# Patient Record
Sex: Female | Born: 1980 | Race: White | Hispanic: No | Marital: Single | Smoking: Never smoker
Health system: Southern US, Community
[De-identification: ages and names within clinical notes are randomized; demographics above are authoritative.]

## PROBLEM LIST (undated history)

## (undated) DIAGNOSIS — R42 Dizziness and giddiness: Secondary | ICD-10-CM

## (undated) DIAGNOSIS — F419 Anxiety disorder, unspecified: Secondary | ICD-10-CM

## (undated) DIAGNOSIS — F319 Bipolar disorder, unspecified: Secondary | ICD-10-CM

## (undated) DIAGNOSIS — E119 Type 2 diabetes mellitus without complications: Secondary | ICD-10-CM

## (undated) DIAGNOSIS — T7840XA Allergy, unspecified, initial encounter: Secondary | ICD-10-CM

## (undated) DIAGNOSIS — F32A Depression, unspecified: Secondary | ICD-10-CM

## (undated) HISTORY — DX: Anxiety disorder, unspecified: F41.9

## (undated) HISTORY — DX: Allergy, unspecified, initial encounter: T78.40XA

## (undated) HISTORY — DX: Depression, unspecified: F32.A

## (undated) HISTORY — PX: CHOLECYSTECTOMY: SHX55

---

## 2005-02-10 ENCOUNTER — Emergency Department: Payer: Self-pay | Admitting: Emergency Medicine

## 2006-07-29 ENCOUNTER — Other Ambulatory Visit: Payer: Self-pay

## 2006-07-29 ENCOUNTER — Emergency Department: Payer: Self-pay | Admitting: Unknown Physician Specialty

## 2007-11-04 ENCOUNTER — Emergency Department: Payer: Self-pay | Admitting: Emergency Medicine

## 2007-12-16 ENCOUNTER — Ambulatory Visit: Payer: Self-pay | Admitting: Gastroenterology

## 2008-07-15 ENCOUNTER — Emergency Department: Payer: Self-pay | Admitting: Emergency Medicine

## 2008-08-06 ENCOUNTER — Emergency Department: Payer: Self-pay | Admitting: Emergency Medicine

## 2008-11-16 ENCOUNTER — Emergency Department: Payer: Self-pay | Admitting: Emergency Medicine

## 2008-11-19 ENCOUNTER — Emergency Department: Payer: Self-pay | Admitting: Emergency Medicine

## 2008-11-28 ENCOUNTER — Ambulatory Visit: Payer: Self-pay | Admitting: Unknown Physician Specialty

## 2008-12-31 ENCOUNTER — Emergency Department: Payer: Self-pay | Admitting: Unknown Physician Specialty

## 2009-02-28 ENCOUNTER — Emergency Department: Payer: Self-pay | Admitting: Emergency Medicine

## 2010-02-13 ENCOUNTER — Inpatient Hospital Stay: Payer: Self-pay | Admitting: Internal Medicine

## 2010-02-23 ENCOUNTER — Emergency Department (HOSPITAL_COMMUNITY)
Admission: EM | Admit: 2010-02-23 | Discharge: 2010-02-24 | Payer: Self-pay | Source: Home / Self Care | Admitting: Emergency Medicine

## 2010-02-26 LAB — URINALYSIS, ROUTINE W REFLEX MICROSCOPIC
Nitrite: NEGATIVE
Specific Gravity, Urine: 1.018 (ref 1.005–1.030)
Urobilinogen, UA: 0.2 mg/dL (ref 0.0–1.0)
pH: 6 (ref 5.0–8.0)

## 2010-02-26 LAB — COMPREHENSIVE METABOLIC PANEL
Albumin: 3.6 g/dL (ref 3.5–5.2)
BUN: 12 mg/dL (ref 6–23)
Calcium: 9.7 mg/dL (ref 8.4–10.5)
Creatinine, Ser: 0.82 mg/dL (ref 0.4–1.2)
Total Protein: 7.7 g/dL (ref 6.0–8.3)

## 2010-02-26 LAB — DIFFERENTIAL
Eosinophils Absolute: 0.4 10*3/uL (ref 0.0–0.7)
Eosinophils Relative: 3 % (ref 0–5)
Lymphs Abs: 3.7 10*3/uL (ref 0.7–4.0)
Monocytes Absolute: 1.5 10*3/uL — ABNORMAL HIGH (ref 0.1–1.0)
Monocytes Relative: 10 % (ref 3–12)

## 2010-02-26 LAB — CBC
MCH: 32.7 pg (ref 26.0–34.0)
MCHC: 34.8 g/dL (ref 30.0–36.0)
MCV: 94 fL (ref 78.0–100.0)
Platelets: 253 10*3/uL (ref 150–400)
RDW: 12.4 % (ref 11.5–15.5)
WBC: 14.7 10*3/uL — ABNORMAL HIGH (ref 4.0–10.5)

## 2010-02-26 LAB — POCT PREGNANCY, URINE: Preg Test, Ur: NEGATIVE

## 2010-02-26 LAB — LIPASE, BLOOD: Lipase: 675 U/L — ABNORMAL HIGH (ref 11–59)

## 2010-04-03 ENCOUNTER — Ambulatory Visit (HOSPITAL_COMMUNITY)
Admission: RE | Admit: 2010-04-03 | Discharge: 2010-04-03 | Disposition: A | Discharge status: Home / Self Care | Payer: Self-pay | Source: Ambulatory Visit | Attending: Surgery | Admitting: Surgery

## 2010-04-03 ENCOUNTER — Encounter (HOSPITAL_COMMUNITY)
Admission: RE | Admit: 2010-04-03 | Discharge: 2010-04-03 | Disposition: A | Discharge status: Home / Self Care | Payer: Self-pay | Source: Ambulatory Visit | Attending: Surgery | Admitting: Surgery

## 2010-04-03 ENCOUNTER — Other Ambulatory Visit (HOSPITAL_COMMUNITY): Payer: Self-pay | Admitting: Surgery

## 2010-04-03 DIAGNOSIS — K8020 Calculus of gallbladder without cholecystitis without obstruction: Secondary | ICD-10-CM | POA: Insufficient documentation

## 2010-04-03 DIAGNOSIS — Z01818 Encounter for other preprocedural examination: Secondary | ICD-10-CM | POA: Insufficient documentation

## 2010-04-03 DIAGNOSIS — Z01812 Encounter for preprocedural laboratory examination: Secondary | ICD-10-CM | POA: Insufficient documentation

## 2010-04-03 DIAGNOSIS — R0602 Shortness of breath: Secondary | ICD-10-CM | POA: Insufficient documentation

## 2010-04-03 LAB — COMPREHENSIVE METABOLIC PANEL
ALT: 31 U/L (ref 0–35)
AST: 22 U/L (ref 0–37)
Albumin: 4 g/dL (ref 3.5–5.2)
CO2: 29 mEq/L (ref 19–32)
Calcium: 9.8 mg/dL (ref 8.4–10.5)
Creatinine, Ser: 0.87 mg/dL (ref 0.4–1.2)
GFR calc Af Amer: >60 mL/min (ref >60)
GFR calc non Af Amer: >60 mL/min (ref >60)
Sodium: 140 mEq/L (ref 135–145)
Total Protein: 7.4 g/dL (ref 6.0–8.3)

## 2010-04-03 LAB — DIFFERENTIAL
Basophils Absolute: 0.1 10*3/uL (ref 0.0–0.1)
Basophils Relative: 0 % (ref 0–1)
Eosinophils Relative: 3 % (ref 0–5)
Lymphocytes Relative: 27 % (ref 12–46)
Neutro Abs: 8.1 10*3/uL — ABNORMAL HIGH (ref 1.7–7.7)

## 2010-04-03 LAB — HCG, SERUM, QUALITATIVE: Preg, Serum: NEGATIVE

## 2010-04-03 LAB — SURGICAL PCR SCREEN
MRSA, PCR: NEGATIVE
Staphylococcus aureus: POSITIVE — AB

## 2010-04-03 LAB — CBC
HCT: 43.6 % (ref 36.0–46.0)
MCHC: 33.7 g/dL (ref 30.0–36.0)
RDW: 13.1 % (ref 11.5–15.5)
WBC: 13.7 10*3/uL — ABNORMAL HIGH (ref 4.0–10.5)

## 2010-04-05 ENCOUNTER — Ambulatory Visit (HOSPITAL_COMMUNITY): Payer: Self-pay

## 2010-04-05 ENCOUNTER — Ambulatory Visit (HOSPITAL_COMMUNITY)
Admission: RE | Admit: 2010-04-05 | Discharge: 2010-04-05 | Disposition: A | Discharge status: Home / Self Care | Payer: Self-pay | Source: Ambulatory Visit | Attending: Surgery | Admitting: Surgery

## 2010-04-05 ENCOUNTER — Other Ambulatory Visit: Payer: Self-pay | Admitting: Surgery

## 2010-04-05 DIAGNOSIS — K8010 Calculus of gallbladder with chronic cholecystitis without obstruction: Secondary | ICD-10-CM | POA: Insufficient documentation

## 2010-04-05 DIAGNOSIS — F319 Bipolar disorder, unspecified: Secondary | ICD-10-CM | POA: Insufficient documentation

## 2010-04-05 DIAGNOSIS — K219 Gastro-esophageal reflux disease without esophagitis: Secondary | ICD-10-CM | POA: Insufficient documentation

## 2010-04-05 DIAGNOSIS — E669 Obesity, unspecified: Secondary | ICD-10-CM | POA: Insufficient documentation

## 2010-04-08 NOTE — Op Note (Signed)
NAME:  Pamela Rasmussen, Pamela Rasmussen             ACCOUNT NO.:  [id number]  MEDICAL RECORD NO.:  [id number]           PATIENT TYPE:  O  LOCATION:  [facility]                         FACILITY:  [facility]  PHYSICIAN:  Wilmon Arms. Corliss Skains, M.D. DATE OF BIRTH:  [birth year]  DATE OF PROCEDURE:  04/05/2010 DATE OF DISCHARGE:  04/05/2010                              OPERATIVE REPORT   PREOPERATIVE DIAGNOSES:  Chronic calculus, cholecystitis, history of gallstone pancreatitis.  POSTOPERATIVE DIAGNOSES:  Chronic calculus, cholecystitis, history of gallstone pancreatitis.  PROCEDURE PERFORMED:  Laparoscopic cholecystectomy with intraoperative cholangiogram.  SURGEON:  Wilmon Arms. Corliss Skains, MD  ANESTHESIA:  General.  INDICATIONS:  This is a 30 year old female who presents with several years of gallbladder symptoms.  She has had a diagnosis of gallstones for least 2 years.  Apparently she had 1 episode of gallstone pancreatitis for which she was hospitalized at [facility].  She presents now for cholecystectomy.  Her preoperative liver function tests are within normal limits.  Her lipase is minimally elevated at 82.  She presents now for cholecystectomy.  DESCRIPTION OF PROCEDURE:  The patient was brought to the operating room and placed supine position on operating room table.  After an adequate level of general anesthesia was obtained, the patient's abdomen was prepped with ChloraPrep and draped in sterile fashion.  Time-out was taken to ensure proper patient, proper procedure.  We infiltrated the area below the umbilicus with 0.25%  Marcaine with epinephrine. Transverse incision was made.  Dissection was carried down through a considerable amount of adipose tissue to the fascia.  I grasped the fascia with Kocher clamps and lifted.  We divided the fascia vertically. We entered the peritoneal cavity bluntly.  A stay suture of 0 Vicryl was placed around the fascial opening.  The Hasson cannula was inserted and secured  to stay suture.  Pneumoperitoneum was obtained by insufflating CO2 maintaining maximal pressure of 15 mmHg.  The laparoscope was inserted and the patient was positioned in reverse Trendelenburg tilted to left.  An 11-mm port was placed in the subxiphoid position.  Two 5-mm ports were placed in the right upper quadrant.  The gallbladder was grasped with clamp and elevated over the edge of the liver.  There were some minimal adhesions to the hilum of gallbladder.  We opened the peritoneum around the hilum of the gallbladder and bluntly dissected around the cystic duct and the cystic artery.  The cystic duct was ligated, clipped distally.  A small opening was created on the cystic duct.  A Cook cholangiogram catheter was inserted through a stab incision threaded in the cystic duct.  A cholangiogram was then obtained which showed good flow proximally and distally in biliary tree with no sign of filling defect.  Contrast flowed easily into the duodenum.  The catheter was removed and the cystic duct was ligated with clips and divided.  The cystic artery was ligated with clips and divided.  Cautery was then used to dissect the gallbladder free from the liver.  The gallbladder was fairly intrahepatic in location.  A couple of small pinholes were made in the gallbladder and little bit of bile was spilled.  However, no stones were noted.  The gallbladder was placed in an EndoCatch sac.  We then thoroughly irrigated the gallbladder fossa and inspected for hemostasis.  We cauterized thoroughly for hemostasis. The gallbladder and EndoCatch sac were then removed through the umbilical port site.  The pursestring sutures were used to close down the umbilical fascia.  We again irrigated the right upper quadrant and suctioned as much irrigation as possible.  Pneumoperitoneum was then released as we removed the trocars.  A 4-0 Monocryl was used to close the skin incisions.  Steri- Strips and clean dressings  were applied.  The patient was then extubated and brought to recovery room in stable condition.  All sponge, instrument, and needle counts were correct.     Wilmon Arms. Corliss Skains, M.D.     MKT/MEDQ  D:  04/05/2010  T:  04/05/2010  Job:  [id number]  Electronically Signed by Manus Rudd M.D. on 04/08/2010 01:49:10 PM

## 2011-04-18 ENCOUNTER — Emergency Department: Payer: Self-pay | Admitting: Emergency Medicine

## 2011-07-15 ENCOUNTER — Emergency Department: Payer: Self-pay | Admitting: Emergency Medicine

## 2011-12-09 IMAGING — US US ABDOMEN COMPLETE
1 series · 14 of 25 positions shown · non-contrast
Comparison: None.

CLINICAL DATA: Abdominal pain.  Pancreatitis.

ABDOMINAL ULTRASOUND COMPLETE

[Series 1: us abdomen complete · 0.27mm/px · 14 of 76 slices shown]
[im 1/76]
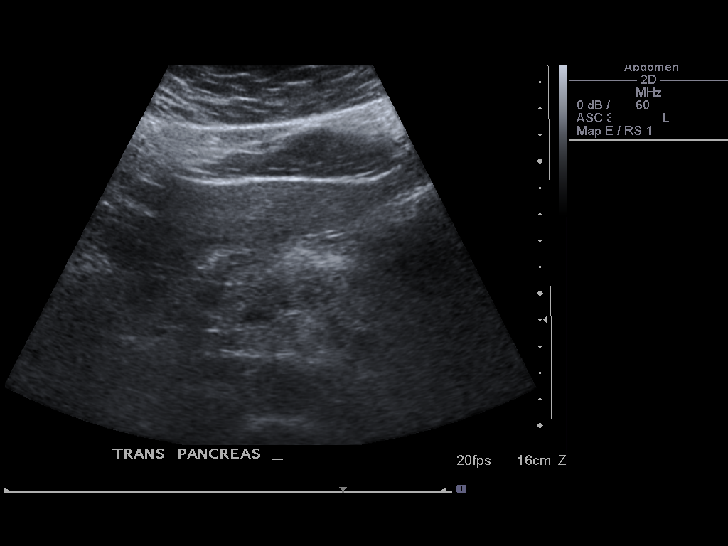
[im 7/76]
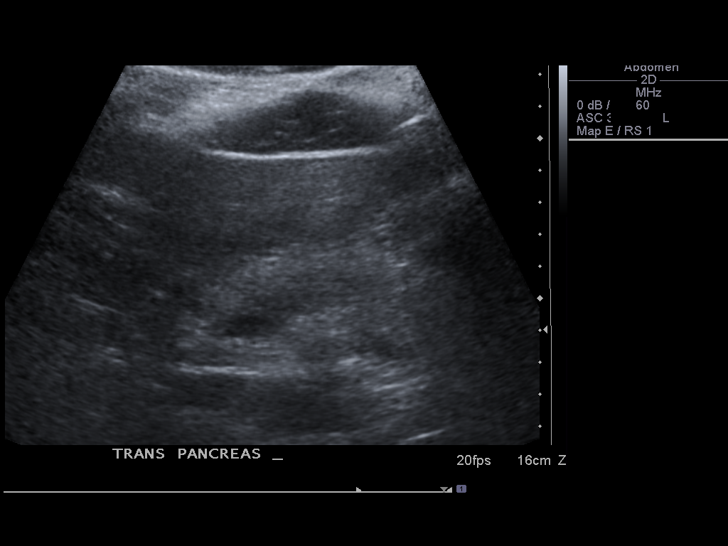
[im 13/76]
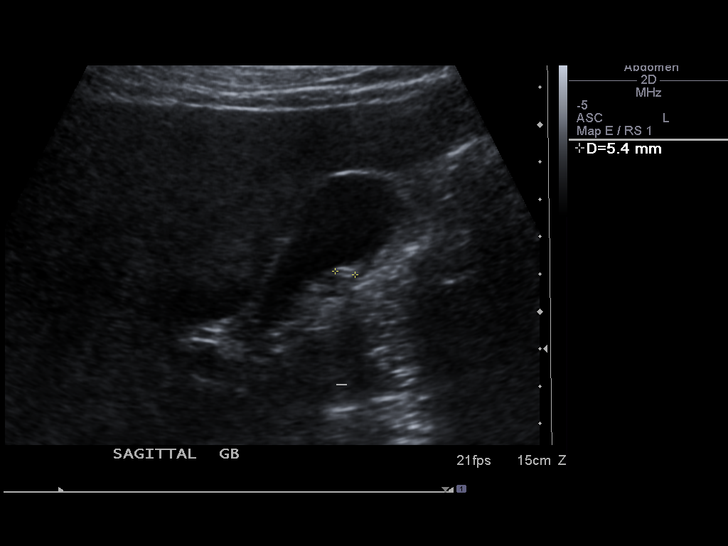
[im 19/76]
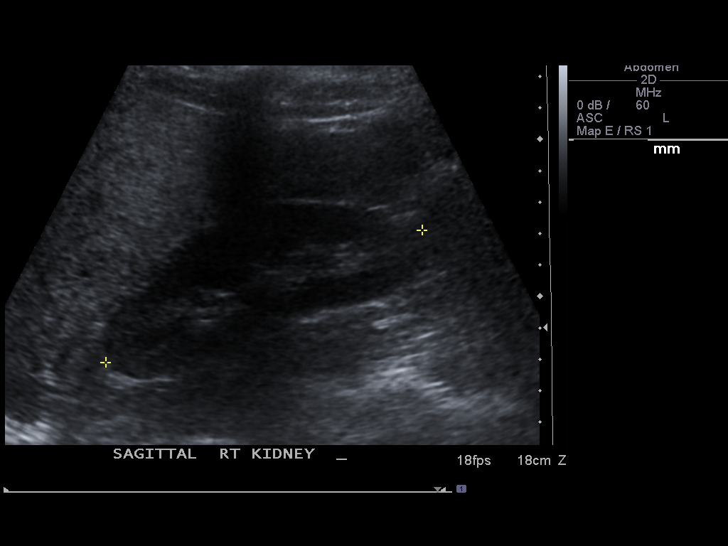
[im 26/76]
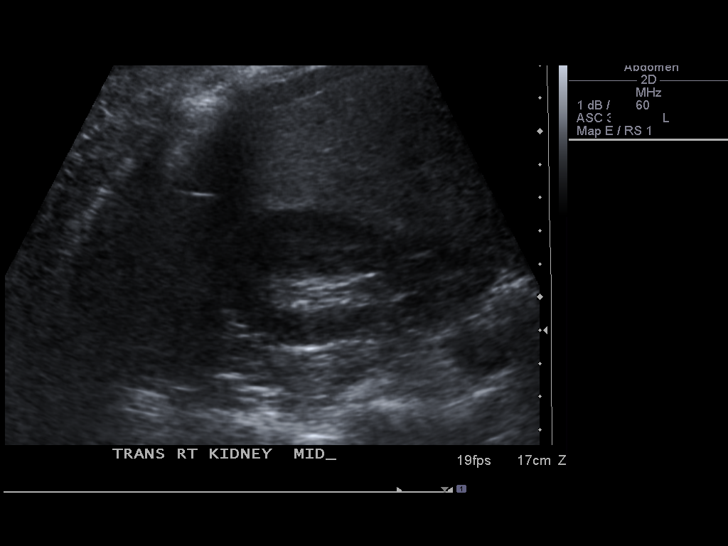
[im 29/76]
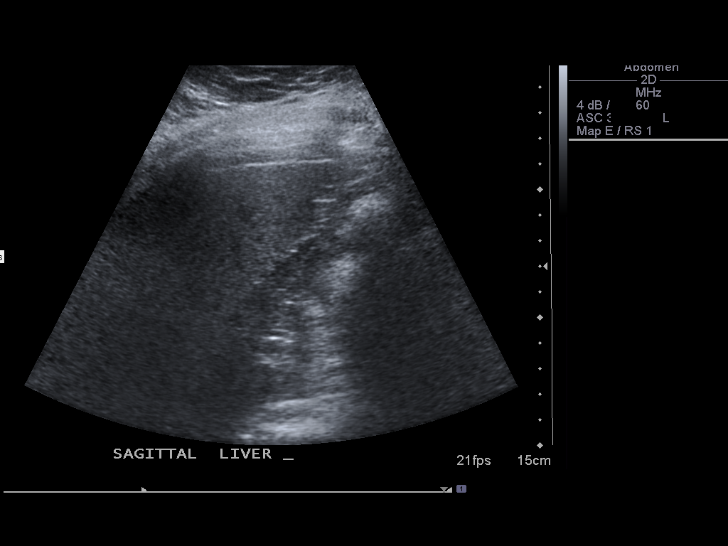
[im 35/76]
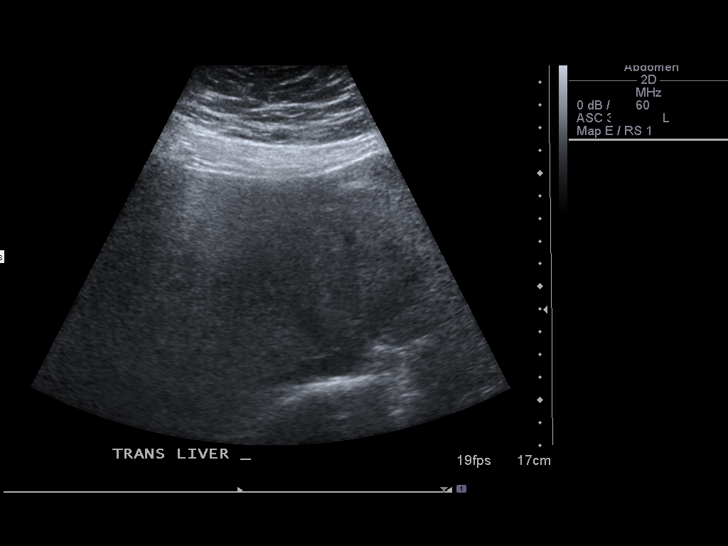
[im 41/76]
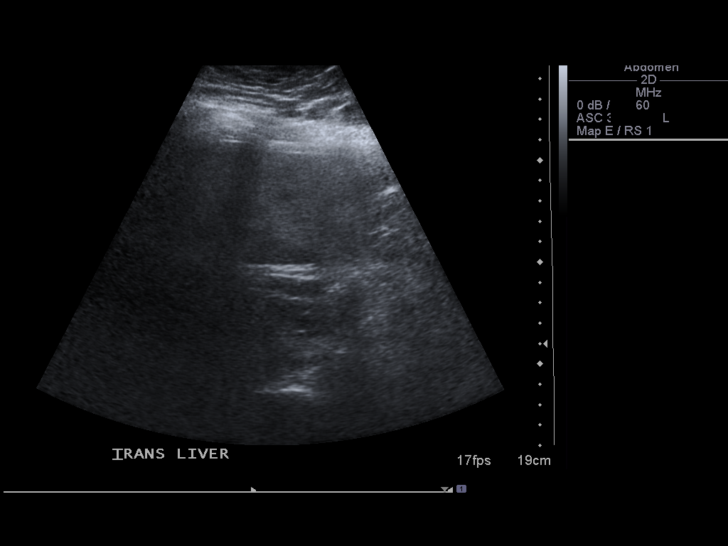
[im 47/76]
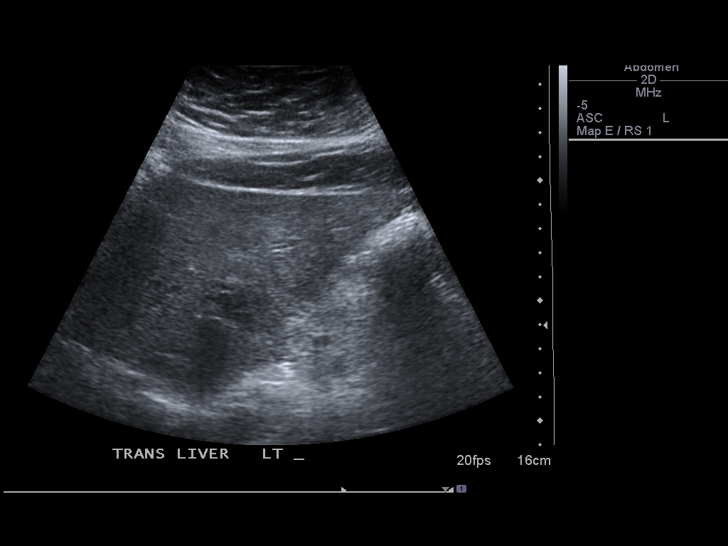
[im 51/76]
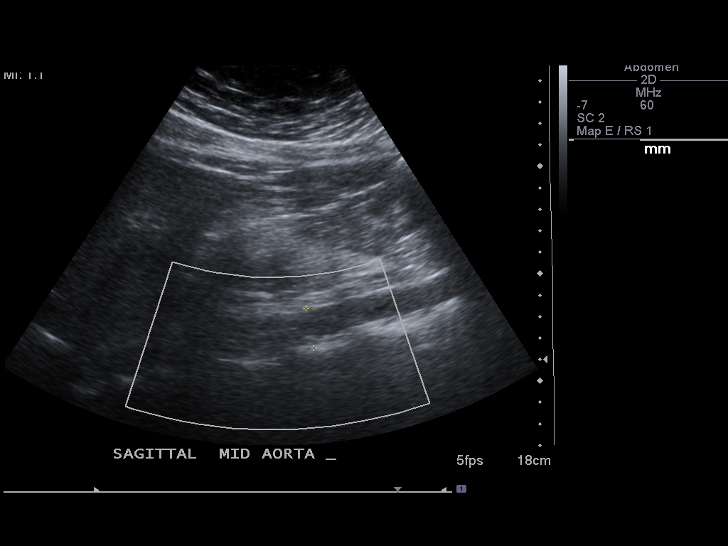
[im 57/76]
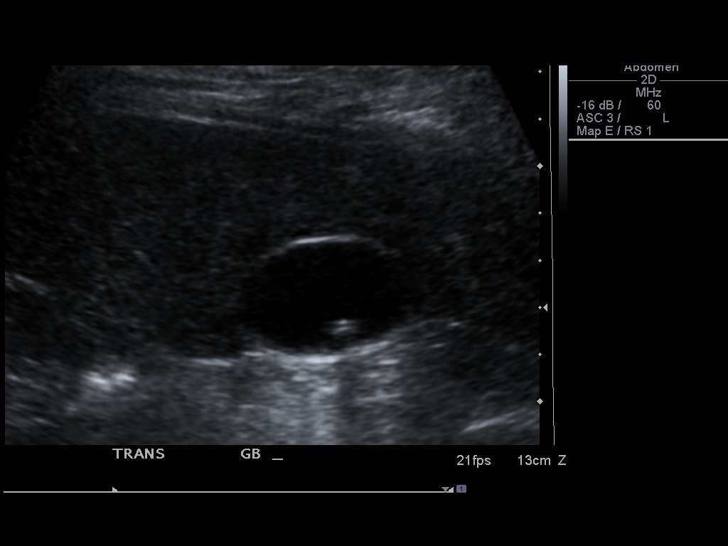
[im 63/76]
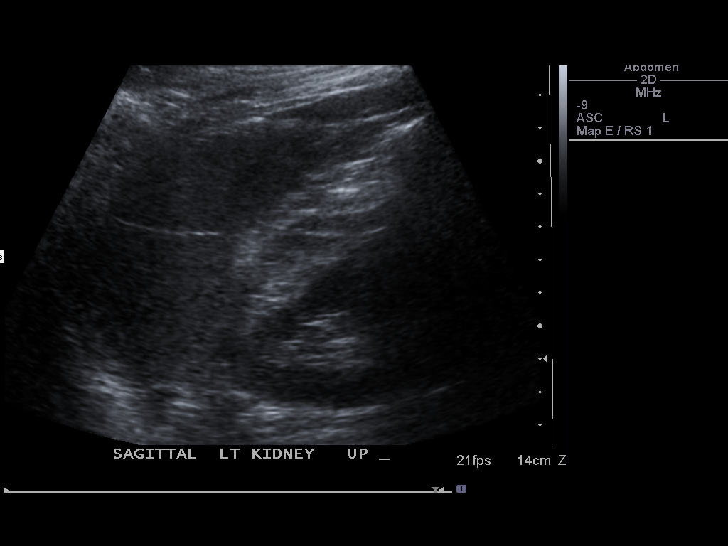
[im 69/76]
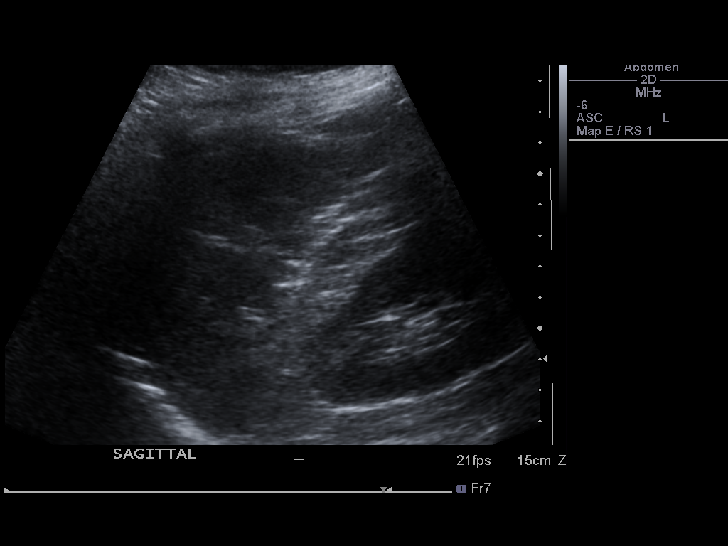
[im 76/76]
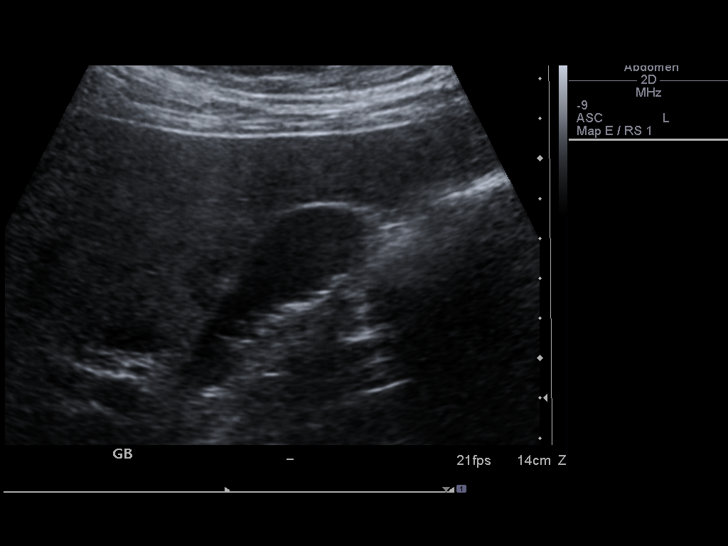

[14 of 25 positions shown; findings below may reference images not displayed]

FINDINGS: Gallbladder:  Multiple small less than 5 mm gallstones are seen.
No evidence of gallbladder dilatation wall thickening.

Common Bile Duct:  Within normal limits in caliber.  Measures 4 mm
in diameter.

Liver: Diffusely increased parenchymal echogenicity, consistent
with hepatic steatosis.  No focal liver mass identified.

IVC:  Appears normal.

Pancreas:  No abnormality identified.

Spleen:  Within normal limits in size and echotexture.

Right kidney:  Normal in size and parenchymal echogenicity.  No
evidence of mass or hydronephrosis.

Left kidney:  Normal in size and parenchymal echogenicity.  No
evidence of mass or hydronephrosis.

Abdominal Aorta:  No aneurysm identified.
IMPRESSION: 1.  Cholelithiasis.  No evidence of acute cholecystitis or biliary
dilatation.
2.  Hepatic steatosis.

## 2012-01-04 ENCOUNTER — Emergency Department: Payer: Self-pay | Admitting: Emergency Medicine

## 2012-01-16 IMAGING — CR DG CHEST 2V
2 series · 2 of 2 positions shown · non-contrast
Comparison: None

CLINICAL DATA: Preop for gallbladder disease.

CHEST - 2 VIEW

[view not recorded (1 of 2)]
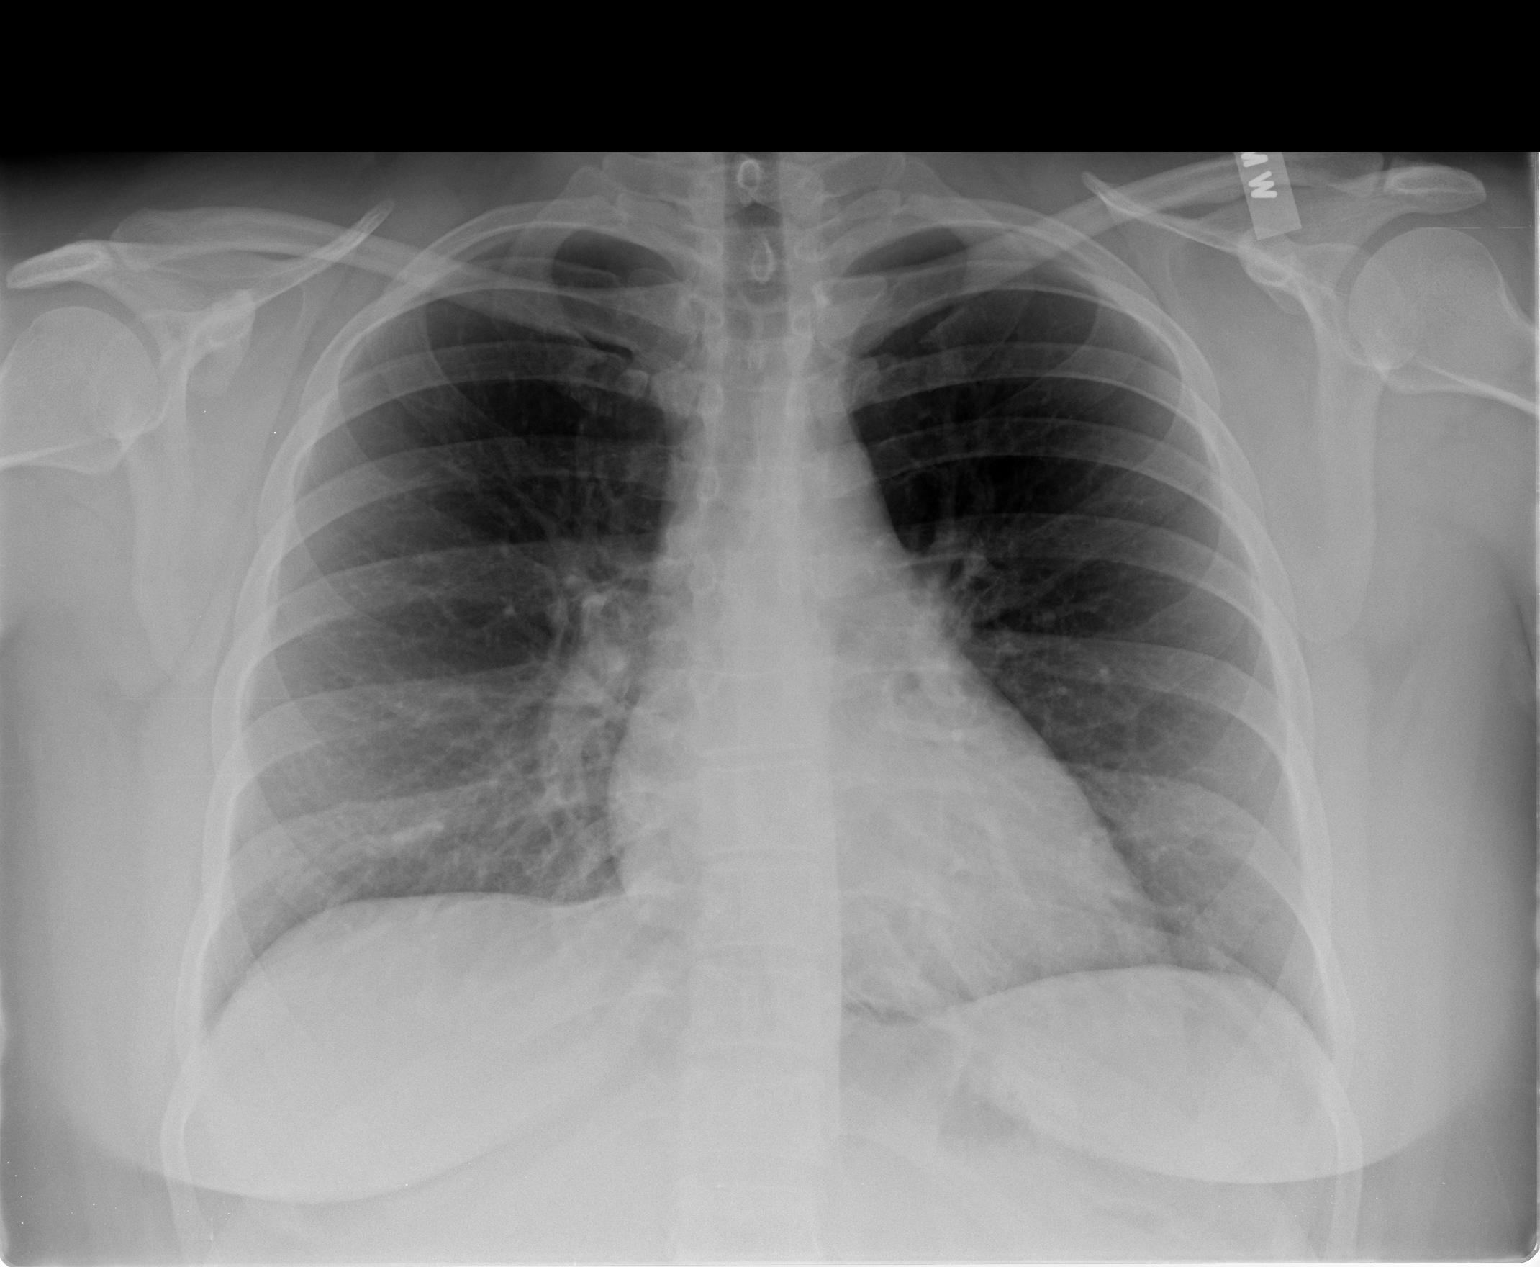

[view not recorded (2 of 2)]
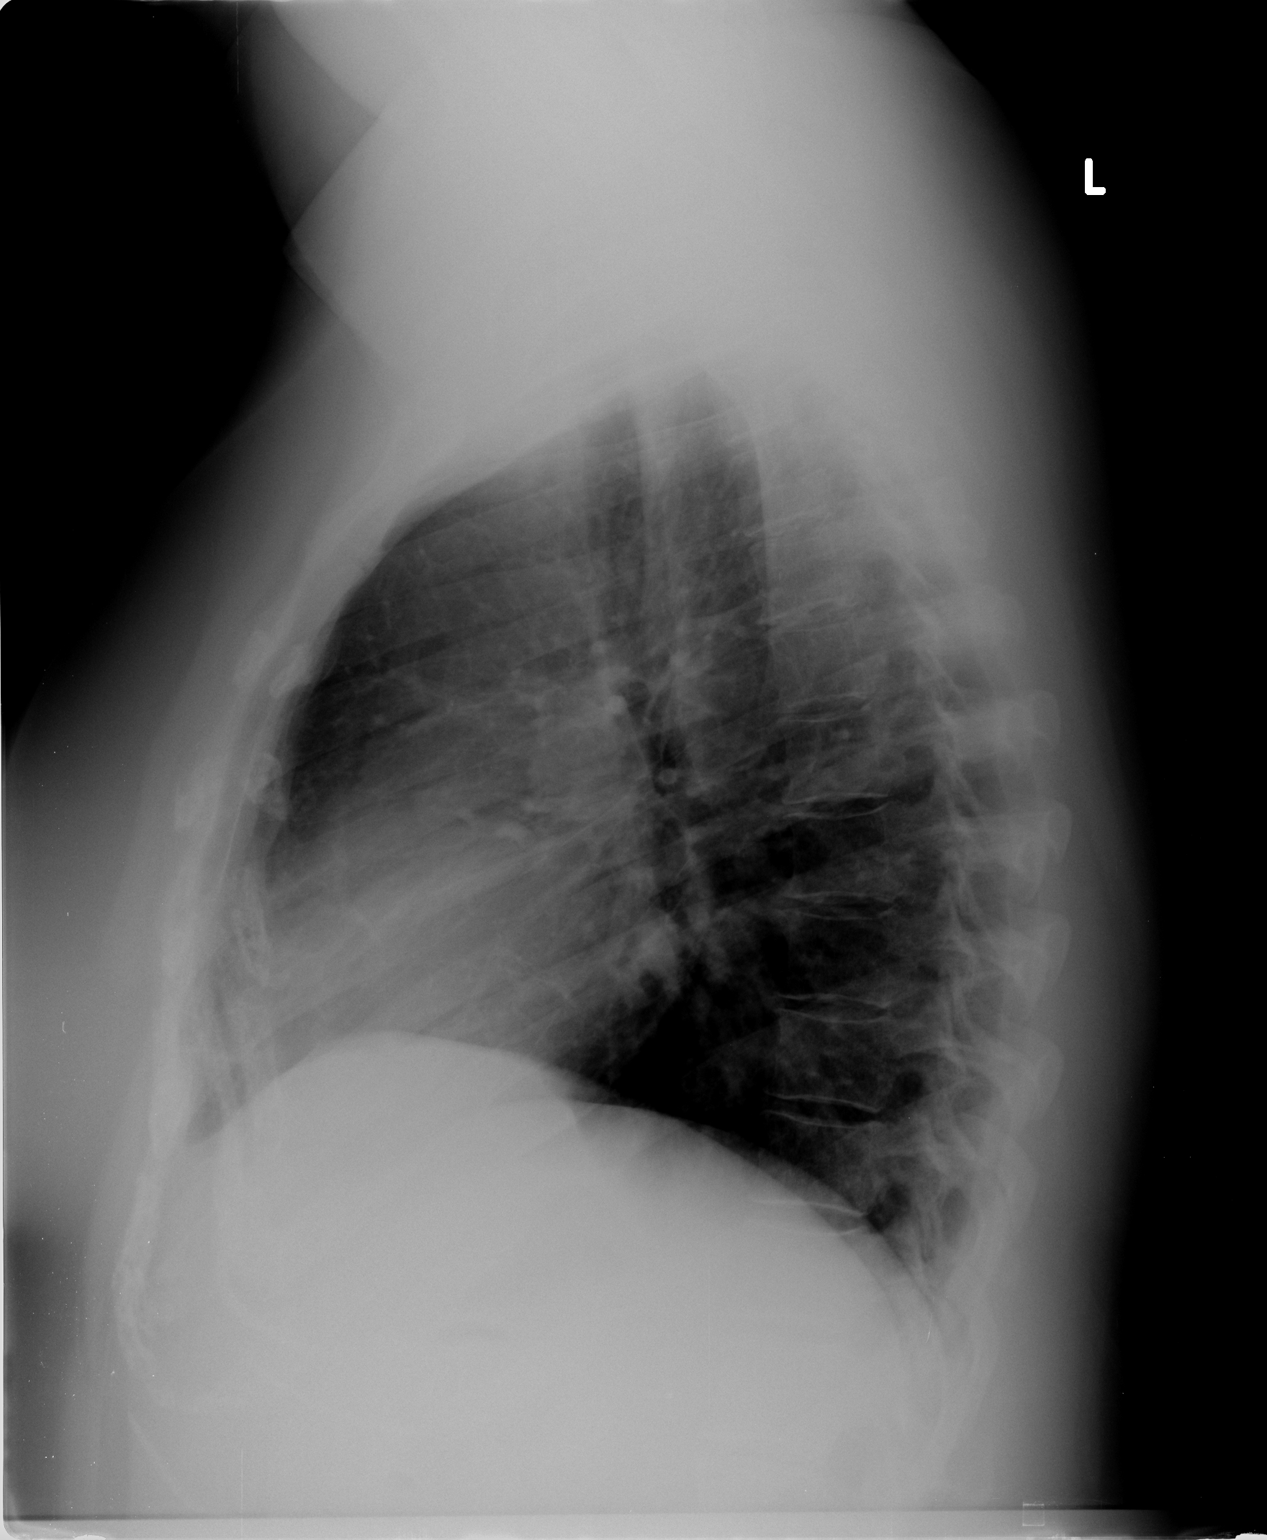

[2 of 2 positions shown; findings below may reference images not displayed]

FINDINGS: The cardiac silhouette, mediastinal and hilar contours
are within normal limits.  The lungs are clear.  The bony thorax is
intact
IMPRESSION: No acute cardiopulmonary findings.

## 2012-01-18 IMAGING — RF DG CHOLANGIOGRAM OPERATIVE
1 series · 5 of 5 positions shown · non-contrast
Comparison: None.

CLINICAL DATA: Laparoscopic cholecystectomy

INTRAOPERATIVE CHOLANGIOGRAM
TECHNIQUE: Cholangiographic images from the C-arm fluoroscopic
device were submitted for interpretation post-operatively.  Please
see the procedural report for the amount of contrast and the
fluoroscopy time utilized.

[Series 1: run · 2 acquisitions, 5 frames shown]
[im 1/2]
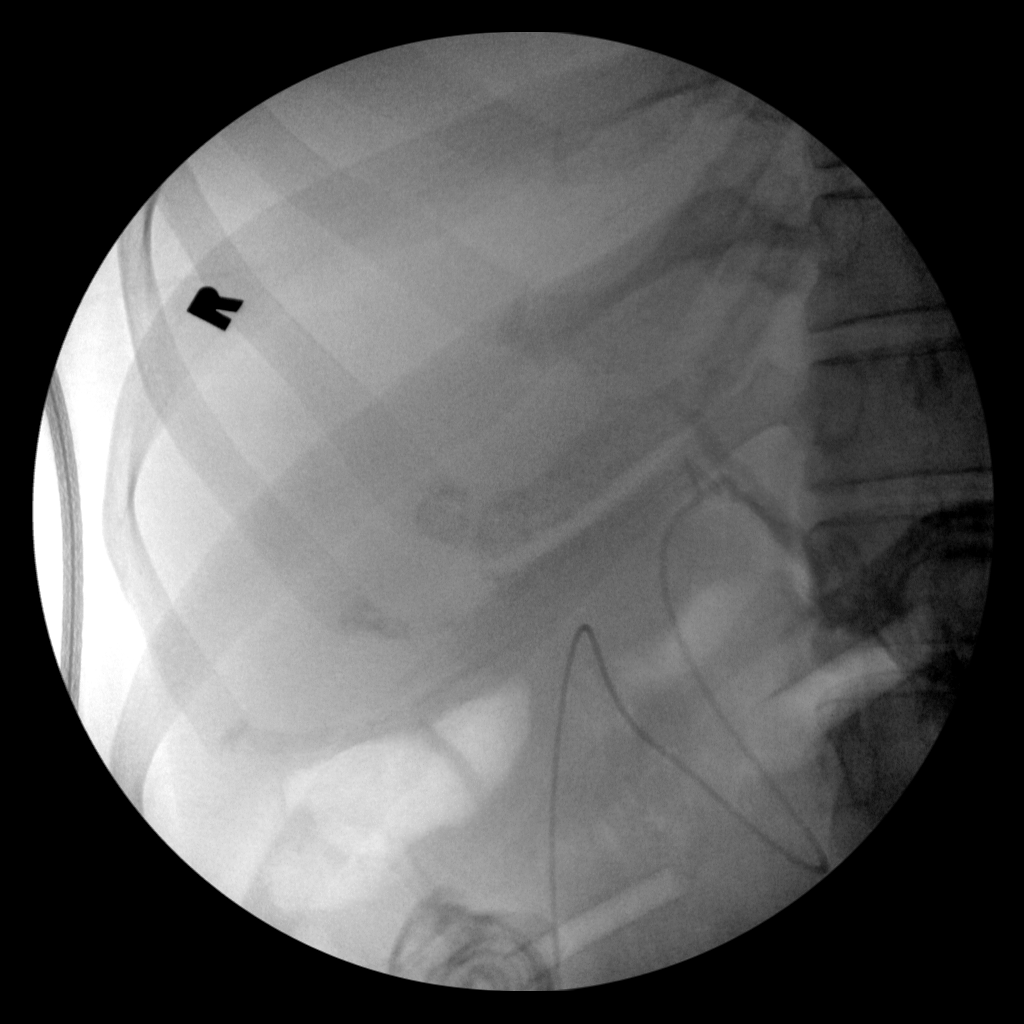
[im 1/2]
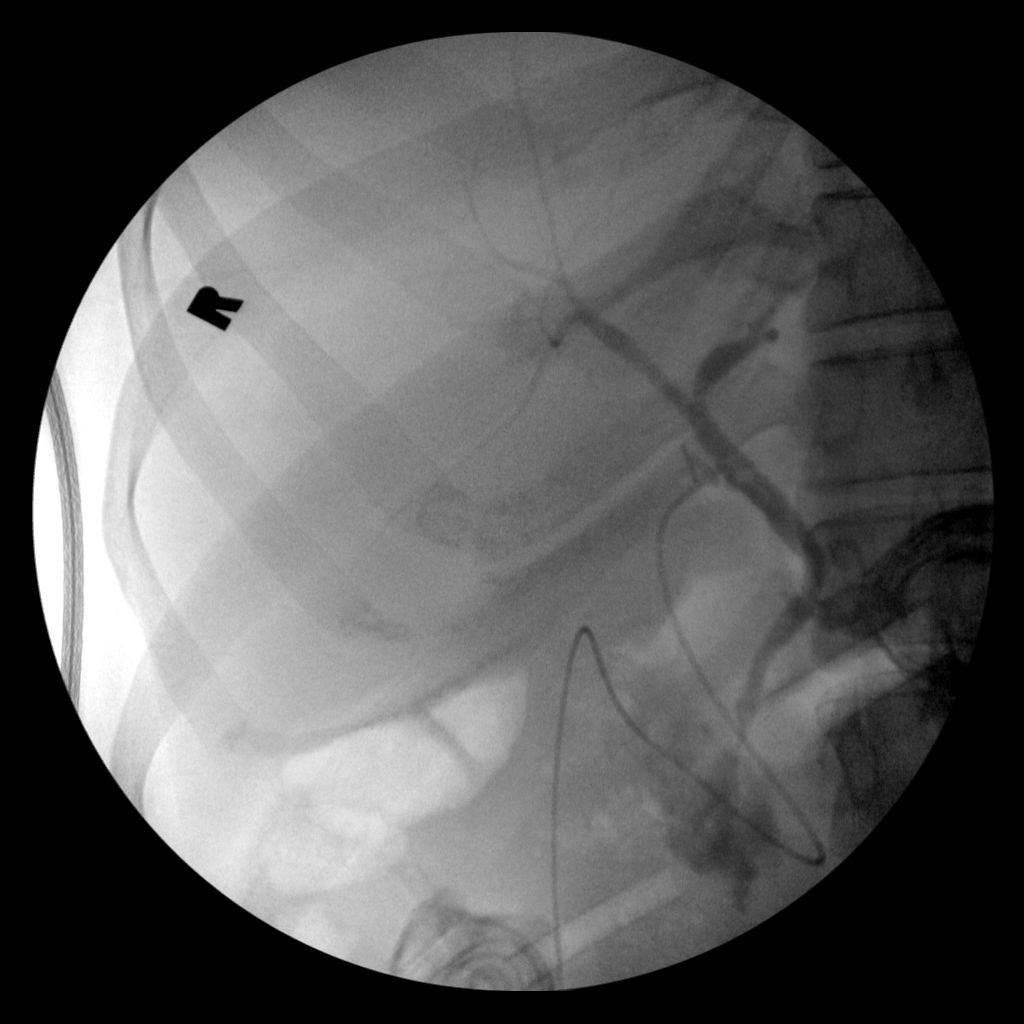
[im 1/2]
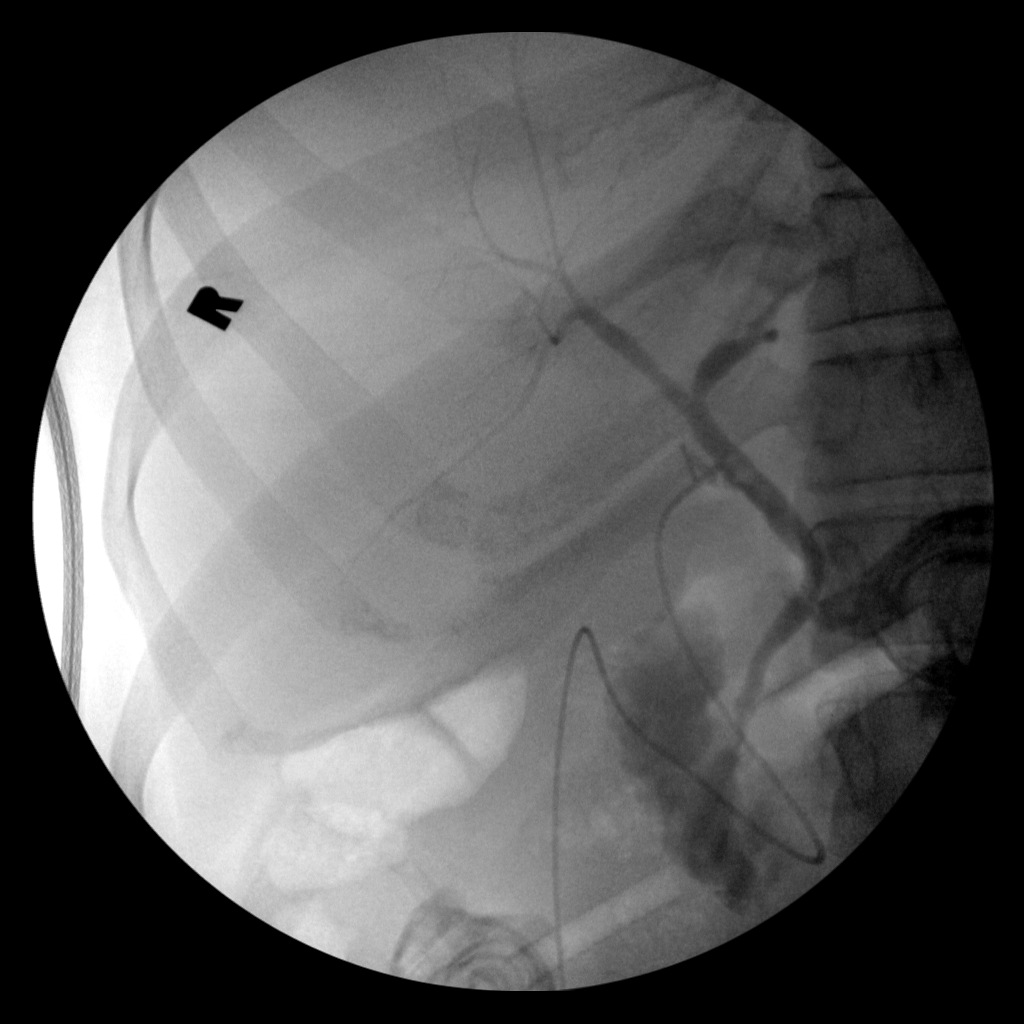
[im 1/2]
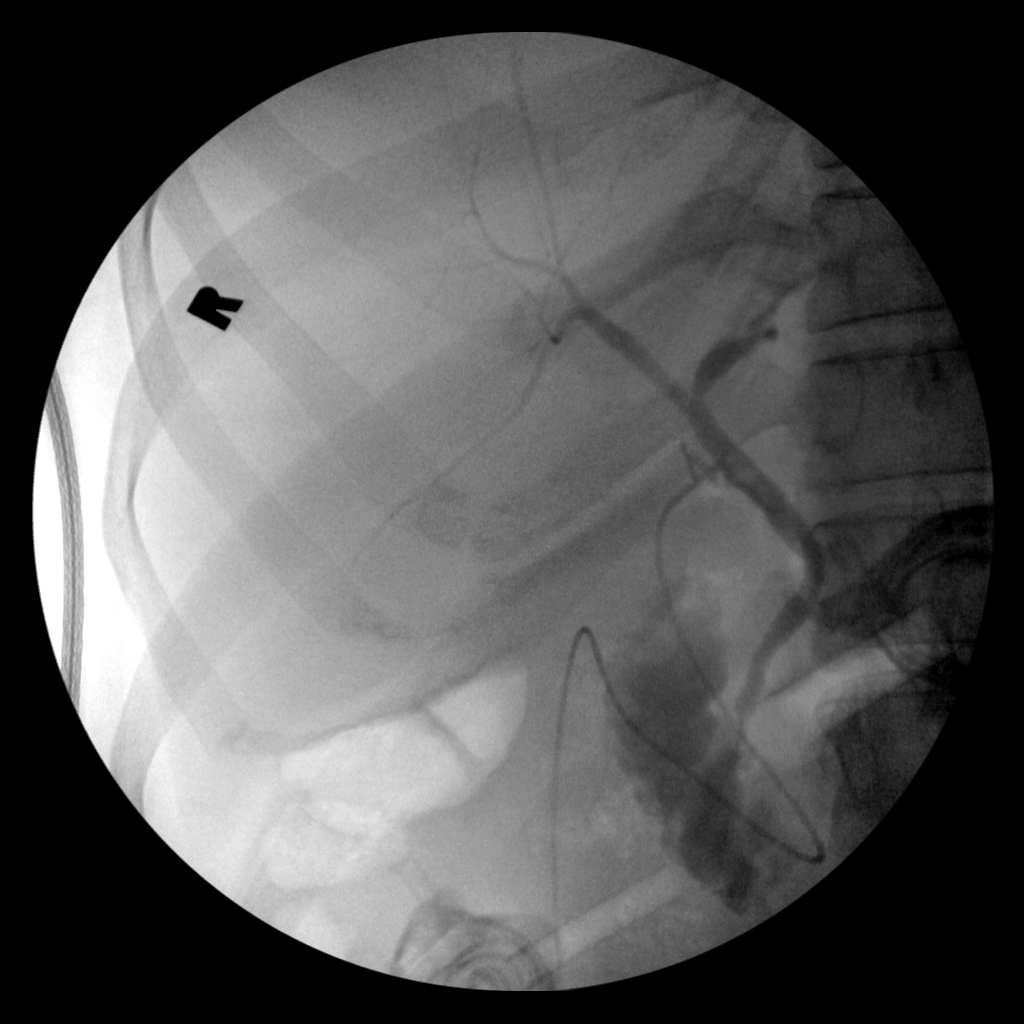
[im 2/2]
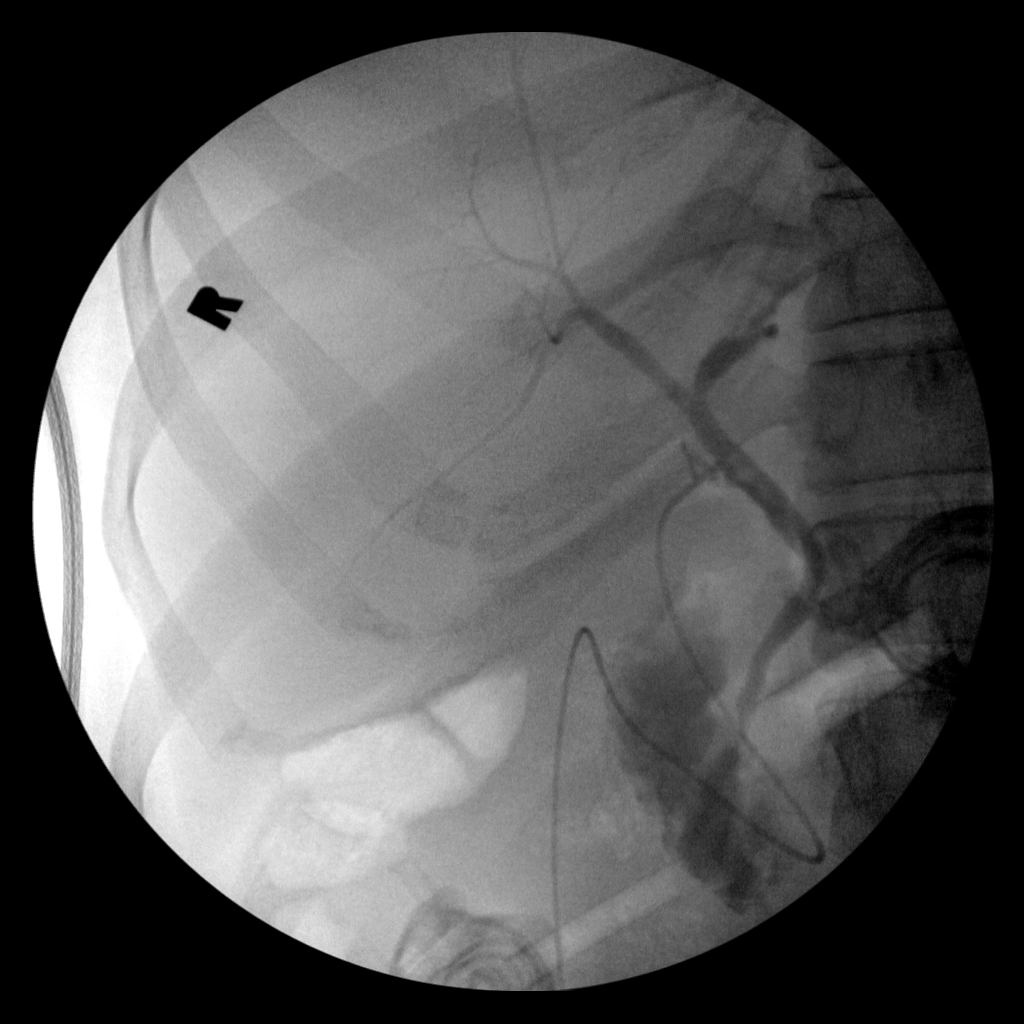

[5 of 5 positions shown; findings below may reference images not displayed]

FINDINGS: The intra and extrahepatic biliary radicles are normal
in caliber.  There are several filling defects in the proximal
common bile duct.  They seem to change shape during the injection,
suggesting that they are air bubbles.  However, several small to
moderate-sized common bile duct stones cannot be excluded based on
this exam.

No evidence for obstruction.
IMPRESSION: Probable air bubbles in the common bile duct.  Cannot rule out
several stones.  See report.

## 2013-06-02 ENCOUNTER — Emergency Department: Payer: Self-pay | Admitting: Emergency Medicine

## 2013-06-02 LAB — COMPREHENSIVE METABOLIC PANEL
ALK PHOS: 99 U/L
ANION GAP: 13 (ref 7–16)
Albumin: 3.8 g/dL (ref 3.4–5.0)
BUN: 15 mg/dL (ref 7–18)
Bilirubin,Total: 0.7 mg/dL (ref 0.2–1.0)
Calcium, Total: 9.6 mg/dL (ref 8.5–10.1)
Chloride: 104 mmol/L (ref 98–107)
Co2: 21 mmol/L (ref 21–32)
Creatinine: 0.92 mg/dL (ref 0.60–1.30)
Glucose: 144 mg/dL — ABNORMAL HIGH (ref 65–99)
Osmolality: 279 (ref 275–301)
Potassium: 3.7 mmol/L (ref 3.5–5.1)
SGOT(AST): 17 U/L (ref 15–37)
SGPT (ALT): 28 U/L (ref 12–78)
Sodium: 138 mmol/L (ref 136–145)
TOTAL PROTEIN: 7.9 g/dL (ref 6.4–8.2)

## 2013-06-02 LAB — LIPASE, BLOOD: LIPASE: 85 U/L (ref 73–393)

## 2013-06-03 LAB — CBC WITH DIFFERENTIAL/PLATELET
BASOS PCT: 0.1 %
Basophil #: 0 10*3/uL (ref 0.0–0.1)
EOS PCT: 0.2 %
Eosinophil #: 0 10*3/uL (ref 0.0–0.7)
HCT: 47.3 % — AB (ref 35.0–47.0)
HGB: 15.7 g/dL (ref 12.0–16.0)
LYMPHS ABS: 0.4 10*3/uL — AB (ref 1.0–3.6)
LYMPHS PCT: 3.1 %
MCH: 31.9 pg (ref 26.0–34.0)
MCHC: 33.1 g/dL (ref 32.0–36.0)
MCV: 96 fL (ref 80–100)
MONO ABS: 0.1 x10 3/mm — AB (ref 0.2–0.9)
MONOS PCT: 0.7 %
NEUTROS ABS: 12.8 10*3/uL — AB (ref 1.4–6.5)
NEUTROS PCT: 95.9 %
PLATELETS: 213 10*3/uL (ref 150–440)
RBC: 4.91 10*6/uL (ref 3.80–5.20)
RDW: 13.3 % (ref 11.5–14.5)
WBC: 13.3 10*3/uL — AB (ref 3.6–11.0)

## 2013-06-03 LAB — URINALYSIS, COMPLETE
Bacteria: NONE SEEN
Bilirubin,UR: NEGATIVE
Blood: NEGATIVE
Glucose,UR: NEGATIVE mg/dL (ref 0–75)
Ketone: NEGATIVE
LEUKOCYTE ESTERASE: NEGATIVE
Nitrite: NEGATIVE
PROTEIN: NEGATIVE
Ph: 5 (ref 4.5–8.0)
RBC,UR: <1 /HPF (ref 0–5)
Specific Gravity: 1.025 (ref 1.003–1.030)

## 2013-10-30 ENCOUNTER — Emergency Department: Payer: Self-pay | Admitting: Emergency Medicine

## 2014-01-30 ENCOUNTER — Emergency Department: Payer: Self-pay | Admitting: Emergency Medicine

## 2014-01-30 LAB — COMPREHENSIVE METABOLIC PANEL
ALBUMIN: 3.4 g/dL (ref 3.4–5.0)
ALK PHOS: 108 U/L
ALT: 44 U/L
ANION GAP: 7 (ref 7–16)
BILIRUBIN TOTAL: 0.3 mg/dL (ref 0.2–1.0)
BUN: 8 mg/dL (ref 7–18)
CO2: 25 mmol/L (ref 21–32)
CREATININE: 1.02 mg/dL (ref 0.60–1.30)
Calcium, Total: 8.7 mg/dL (ref 8.5–10.1)
Chloride: 110 mmol/L — ABNORMAL HIGH (ref 98–107)
GLUCOSE: 136 mg/dL — AB (ref 65–99)
Osmolality: 284 (ref 275–301)
POTASSIUM: 3.8 mmol/L (ref 3.5–5.1)
SGOT(AST): 32 U/L (ref 15–37)
Sodium: 142 mmol/L (ref 136–145)
TOTAL PROTEIN: 7.2 g/dL (ref 6.4–8.2)

## 2014-01-30 LAB — CBC
HCT: 43.8 % (ref 35.0–47.0)
HGB: 14.4 g/dL (ref 12.0–16.0)
MCH: 31.4 pg (ref 26.0–34.0)
MCHC: 32.8 g/dL (ref 32.0–36.0)
MCV: 96 fL (ref 80–100)
Platelet: 237 10*3/uL (ref 150–440)
RBC: 4.57 10*6/uL (ref 3.80–5.20)
RDW: 13.3 % (ref 11.5–14.5)
WBC: 9.6 10*3/uL (ref 3.6–11.0)

## 2014-01-30 LAB — URINALYSIS, COMPLETE
BILIRUBIN, UR: NEGATIVE
Blood: NEGATIVE
Glucose,UR: NEGATIVE mg/dL (ref 0–75)
KETONE: NEGATIVE
LEUKOCYTE ESTERASE: NEGATIVE
NITRITE: NEGATIVE
PROTEIN: NEGATIVE
Ph: 5 (ref 4.5–8.0)
Specific Gravity: 1.023 (ref 1.003–1.030)
Squamous Epithelial: 5
WBC UR: 7 /HPF (ref 0–5)

## 2016-09-26 ENCOUNTER — Emergency Department
Admission: EM | Admit: 2016-09-26 | Discharge: 2016-09-26 | Disposition: A | Discharge status: Home / Self Care | Payer: Self-pay | Attending: Emergency Medicine | Admitting: Emergency Medicine

## 2016-09-26 ENCOUNTER — Encounter: Payer: Self-pay | Admitting: *Deleted

## 2016-09-26 DIAGNOSIS — R42 Dizziness and giddiness: Secondary | ICD-10-CM | POA: Insufficient documentation

## 2016-09-26 HISTORY — DX: Bipolar disorder, unspecified: F31.9

## 2016-09-26 HISTORY — DX: Dizziness and giddiness: R42

## 2016-09-26 LAB — BASIC METABOLIC PANEL
Anion gap: 6 (ref 5–15)
BUN: 12 mg/dL (ref 6–20)
CO2: 25 mmol/L (ref 22–32)
Calcium: 9.2 mg/dL (ref 8.9–10.3)
Chloride: 108 mmol/L (ref 101–111)
Creatinine, Ser: 0.92 mg/dL (ref 0.44–1.00)
GFR calc Af Amer: >60 mL/min (ref >60)
GFR calc non Af Amer: >60 mL/min (ref >60)
GLUCOSE: 109 mg/dL — AB (ref 65–99)
POTASSIUM: 4 mmol/L (ref 3.5–5.1)
Sodium: 139 mmol/L (ref 135–145)

## 2016-09-26 LAB — URINALYSIS, COMPLETE (UACMP) WITH MICROSCOPIC
Bacteria, UA: NONE SEEN
Bilirubin Urine: NEGATIVE
Glucose, UA: NEGATIVE mg/dL
Ketones, ur: NEGATIVE mg/dL
Leukocytes, UA: NEGATIVE
Nitrite: NEGATIVE
PH: 7 (ref 5.0–8.0)
Protein, ur: NEGATIVE mg/dL
SPECIFIC GRAVITY, URINE: 1.008 (ref 1.005–1.030)

## 2016-09-26 LAB — CBC
HCT: 41.7 % (ref 35.0–47.0)
Hemoglobin: 14.5 g/dL (ref 12.0–16.0)
MCH: 32.3 pg (ref 26.0–34.0)
MCHC: 34.7 g/dL (ref 32.0–36.0)
MCV: 93 fL (ref 80.0–100.0)
PLATELETS: 246 10*3/uL (ref 150–440)
RBC: 4.49 MIL/uL (ref 3.80–5.20)
RDW: 13.4 % (ref 11.5–14.5)
WBC: 9.2 10*3/uL (ref 3.6–11.0)

## 2016-09-26 LAB — PREGNANCY, URINE: Preg Test, Ur: NEGATIVE

## 2016-09-26 MED ORDER — MECLIZINE HCL 25 MG PO TABS
25.0000 mg | ORAL_TABLET | Freq: Once | ORAL | Status: AC
  Administered 2016-09-26: 25 mg via ORAL
  Filled 2016-09-26: qty 1

## 2016-09-26 MED ORDER — MECLIZINE HCL 25 MG PO TABS: 25.0000 mg | ORAL_TABLET | Freq: Three times a day (TID) | ORAL | 0 refills | Status: DC | PRN

## 2016-09-26 NOTE — ED Notes (Signed)
Pt verbalized understanding of discharge instructions. NAD at this time. 

## 2016-09-26 NOTE — ED Notes (Signed)
Registration at bedside.

## 2016-09-26 NOTE — ED Notes (Signed)
ED Provider at bedside. 

## 2016-09-26 NOTE — ED Provider Notes (Signed)
[facility] Emergency Department Provider Note  ____________________________________________  Time seen: Approximately 11:23 AM  I have reviewed the triage vital signs and the nursing notes.   HISTORY  Chief Complaint Dizziness   HPI Pamela Rasmussen is a 36 y.o. female history of bipolar and vertigo who presents for evaluation of vertigo. Patient reports several episodes of vertigo over the course of the last week. She presents today because her not getting better. She used to have meclizine which helped a however she does not have at this time. She denies any recent illnesses or URI. She denies headache, dysarthria, diplopia, dysphasia, gait instability, facial droop, slurred speech, unilateral weakness or numbness. She is not a smoker. No personal or family history of stroke. Her symptoms are only present when she turns her head or stands up. They resolve at rest.  Past Medical History:  Diagnosis Date  . Bipolar 1 disorder (HCC)   . Vertigo     There are no active problems to display for this patient.   No past surgical history on file.  Prior to Admission medications   Medication Sig Start Date End Date Taking? Authorizing Provider  meclizine (ANTIVERT) 25 MG tablet Take 1 tablet (25 mg total) by mouth 3 (three) times daily as needed for dizziness. 09/26/16   Nita Sickle, MD    Allergies Patient has no known allergies.  No family history on file.  Social History Social History  Substance Use Topics  . Smoking status: Never Smoker  . Smokeless tobacco: Not on file  . Alcohol use No    Review of Systems  Constitutional: Negative for fever. Eyes: Negative for visual changes. ENT: Negative for sore throat. Neck: No neck pain  Cardiovascular: Negative for chest pain. Respiratory: Negative for shortness of breath. Gastrointestinal: Negative for abdominal pain, vomiting or diarrhea. Genitourinary: Negative for  dysuria. Musculoskeletal: Negative for back pain. Skin: Negative for rash. Neurological: Negative for headaches, weakness or numbness. + vertigo Psych: No SI or HI  ____________________________________________   PHYSICAL EXAM:  VITAL SIGNS: ED Triage Vitals  Enc Vitals Group     BP 09/26/16 0821 139/73     Pulse Rate 09/26/16 0821 83     Resp 09/26/16 0821 18     Temp 09/26/16 0821 98.3 F (36.8 C)     Temp Source 09/26/16 0821 Oral     SpO2 09/26/16 0821 96 %     Weight 09/26/16 0823 265 lb (120.2 kg)     Height 09/26/16 0823 5\' 4"  (1.626 m)     Head Circumference --      Peak Flow --      Pain Score --      Pain Loc --      Pain Edu? --      Excl. in GC? --     Constitutional: Alert and oriented. Well appearing and in no apparent distress. HEENT:      Head: Normocephalic and atraumatic.         Eyes: Conjunctivae are normal. Sclera is non-icteric.       Mouth/Throat: Mucous membranes are moist.       Ear: TMs visualized and clear      Neck: Supple with no signs of meningismus. No bruit Cardiovascular: Regular rate and rhythm. No murmurs, gallops, or rubs. 2+ symmetrical distal pulses are present in all extremities. No JVD. Respiratory: Normal respiratory effort. Lungs are clear to auscultation bilaterally. No wheezes, crackles, or rhonchi.  Gastrointestinal: Soft,  non tender, and non distended with positive bowel sounds. No rebound or guarding. Musculoskeletal: Nontender with normal range of motion in all extremities. No edema, cyanosis, or erythema of extremities. Neurologic: Normal speech and language. A & O x3, PERRL, no nystagmus, CN II-XII intact, motor testing reveals good tone and bulk throughout. There is no evidence of pronator drift or dysmetria. Muscle strength is 5/5 throughout. Sensory examination is intact. Gait is normal. Dix-Hallpike elicited horizontal fatigable nystagmus and vertical Skin: Skin is warm, dry and intact. No rash noted. Psychiatric: Mood  and affect are normal. Speech and behavior are normal.  ____________________________________________   LABS (all labs ordered are listed, but only abnormal results are displayed)  Labs Reviewed  BASIC METABOLIC PANEL - Abnormal; Notable for the following:       Result Value   Glucose, Bld 109 (*)    All other components within normal limits  URINALYSIS, COMPLETE (UACMP) WITH MICROSCOPIC - Abnormal; Notable for the following:    Color, Urine STRAW (*)    APPearance CLEAR (*)    Hgb urine dipstick SMALL (*)    Squamous Epithelial / LPF 0-5 (*)    All other components within normal limits  CBC  PREGNANCY, URINE  CBG MONITORING, ED   ____________________________________________  EKG  ED ECG REPORT I, Nita Sickle, the attending physician, personally viewed and interpreted this ECG.  Normal sinus rhythm, rate 92, normal intervals, normal axis, no ST elevations or depressions. Normal EKG.  ____________________________________________  RADIOLOGY  none  ____________________________________________   PROCEDURES  Procedure(s) performed: None Procedures Critical Care performed:  None ____________________________________________   INITIAL IMPRESSION / ASSESSMENT AND PLAN / ED COURSE   36 y.o. female history of bipolar and vertigo who presents for evaluation of positional paroxsymal vertigo. Patient is neurologically intact with Dix-Hallpike maneuver eliciting horizontal fatigable nystagmus and vertigo which resolved within less than a minute. No risk factors for stroke on this otherwise young and healthy patient with a history of BPPV. No indication for imaging at this time. Will treat patient with meclizine. Labs in no acute findings. Pregnancy test negative.    _________________________ 1:24 PM on 09/26/2016 -----------------------------------------  Patient feels markedly improved, ambulating without difficulty, continues to look extremely well and neurologically  intact. She can be discharged home on meclizine and referred to ENT. I discussed signs and symptoms of the central vertigo and recommended she return to the emergency room if these sx develop  Pertinent labs & imaging results that were available during my care of the patient were reviewed by me and considered in my medical decision making (see chart for details).    ____________________________________________   FINAL CLINICAL IMPRESSION(S) / ED DIAGNOSES  Final diagnoses:  Vertigo      NEW MEDICATIONS STARTED DURING THIS VISIT:  New Prescriptions   MECLIZINE (ANTIVERT) 25 MG TABLET    Take 1 tablet (25 mg total) by mouth 3 (three) times daily as needed for dizziness.     Note:  This document was prepared using Dragon voice recognition software and may include unintentional dictation errors.    Don Perking, [place], MD 09/26/16 [phone]

## 2016-09-26 NOTE — ED Triage Notes (Signed)
Pt reports dizziness for three days , dizziness increases when pt turns head, pt reports a history of vertigo ,pt denies any other problems

## 2016-11-17 ENCOUNTER — Emergency Department
Admission: EM | Admit: 2016-11-17 | Discharge: 2016-11-17 | Disposition: A | Discharge status: Home / Self Care | Payer: Self-pay | Attending: Emergency Medicine | Admitting: Emergency Medicine

## 2016-11-17 ENCOUNTER — Encounter: Payer: Self-pay | Admitting: Emergency Medicine

## 2016-11-17 DIAGNOSIS — H81399 Other peripheral vertigo, unspecified ear: Secondary | ICD-10-CM | POA: Insufficient documentation

## 2016-11-17 DIAGNOSIS — R110 Nausea: Secondary | ICD-10-CM | POA: Insufficient documentation

## 2016-11-17 DIAGNOSIS — Z79899 Other long term (current) drug therapy: Secondary | ICD-10-CM | POA: Insufficient documentation

## 2016-11-17 LAB — CBC
HEMATOCRIT: 42.7 % (ref 35.0–47.0)
Hemoglobin: 14.6 g/dL (ref 12.0–16.0)
MCH: 32 pg (ref 26.0–34.0)
MCHC: 34.1 g/dL (ref 32.0–36.0)
MCV: 93.9 fL (ref 80.0–100.0)
PLATELETS: 222 10*3/uL (ref 150–440)
RBC: 4.55 MIL/uL (ref 3.80–5.20)
RDW: 13.6 % (ref 11.5–14.5)
WBC: 10.5 10*3/uL (ref 3.6–11.0)

## 2016-11-17 LAB — URINALYSIS, COMPLETE (UACMP) WITH MICROSCOPIC
BACTERIA UA: NONE SEEN
Bilirubin Urine: NEGATIVE
GLUCOSE, UA: NEGATIVE mg/dL
Hgb urine dipstick: NEGATIVE
Ketones, ur: NEGATIVE mg/dL
Leukocytes, UA: NEGATIVE
Nitrite: NEGATIVE
PROTEIN: NEGATIVE mg/dL
RBC / HPF: NONE SEEN RBC/hpf (ref 0–5)
Specific Gravity, Urine: 1.013 (ref 1.005–1.030)
pH: 8 (ref 5.0–8.0)

## 2016-11-17 LAB — BASIC METABOLIC PANEL
Anion gap: 6 (ref 5–15)
BUN: 9 mg/dL (ref 6–20)
CHLORIDE: 110 mmol/L (ref 101–111)
CO2: 24 mmol/L (ref 22–32)
CREATININE: 0.91 mg/dL (ref 0.44–1.00)
Calcium: 9.1 mg/dL (ref 8.9–10.3)
GFR calc Af Amer: >60 mL/min (ref >60)
GFR calc non Af Amer: >60 mL/min (ref >60)
GLUCOSE: 169 mg/dL — AB (ref 65–99)
POTASSIUM: 3.8 mmol/L (ref 3.5–5.1)
Sodium: 140 mmol/L (ref 135–145)

## 2016-11-17 MED ORDER — DIPHENHYDRAMINE HCL 50 MG/ML IJ SOLN
25.0000 mg | Freq: Once | INTRAMUSCULAR | Status: AC
  Administered 2016-11-17: 25 mg via INTRAVENOUS
  Filled 2016-11-17: qty 1

## 2016-11-17 MED ORDER — MECLIZINE HCL 25 MG PO TABS
50.0000 mg | ORAL_TABLET | Freq: Once | ORAL | Status: AC
  Administered 2016-11-17: 50 mg via ORAL
  Filled 2016-11-17: qty 2

## 2016-11-17 MED ORDER — MECLIZINE HCL 25 MG PO TABS: 25.0000 mg | ORAL_TABLET | Freq: Three times a day (TID) | ORAL | 0 refills | Status: AC | PRN

## 2016-11-17 MED ORDER — SODIUM CHLORIDE 0.9 % IV BOLUS (SEPSIS)
1000.0000 mL | Freq: Once | INTRAVENOUS | Status: AC
  Administered 2016-11-17: 1000 mL via INTRAVENOUS

## 2016-11-17 MED ORDER — METOCLOPRAMIDE HCL 5 MG/ML IJ SOLN
10.0000 mg | Freq: Once | INTRAMUSCULAR | Status: AC
  Administered 2016-11-17: 10 mg via INTRAVENOUS
  Filled 2016-11-17: qty 2

## 2016-11-17 MED ORDER — ONDANSETRON HCL 4 MG/2ML IJ SOLN
4.0000 mg | Freq: Once | INTRAMUSCULAR | Status: AC
  Administered 2016-11-17: 4 mg via INTRAVENOUS
  Filled 2016-11-17: qty 2

## 2016-11-17 MED ORDER — ONDANSETRON HCL 4 MG PO TABS: 4.0000 mg | ORAL_TABLET | Freq: Three times a day (TID) | ORAL | 0 refills | Status: DC | PRN

## 2016-11-17 NOTE — ED Notes (Signed)
Pt reports does not feel better after medication. Dr Marisa Severin notified.

## 2016-11-17 NOTE — ED Notes (Signed)
MD at bedside for eval.

## 2016-11-17 NOTE — ED Provider Notes (Signed)
[facility] Emergency Department Provider Note ____________________________________________   First MD Initiated Contact with Patient 11/17/16 0840     (approximate)  I have reviewed the triage vital signs and the nursing notes.   HISTORY  Chief Complaint Dizziness    HPI Rona Tomson is a 36 y.o. female  with past medical history of bipolar and BPPV who presents with vertigo, acute onset yesterday, initially intermittent but now more constant, worse with movement, and resolved with laying still. Patient reports associated nausea and one episode of vomiting yesterday.  she denies headache, but says that she feels lightheaded. She denies abdominal pain or diarrhea. No associated urinary symptoms or fever. Patient states that the symptoms feel identical to prior vertigo. She did not take anything at home for.   Past Medical History:  Diagnosis Date  . Bipolar 1 disorder (HCC)   . Vertigo     There are no active problems to display for this patient.   Past Surgical History:  Procedure Laterality Date  . CHOLECYSTECTOMY      Prior to Admission medications   Medication Sig Start Date End Date Taking? Authorizing Provider  meclizine (ANTIVERT) 25 MG tablet Take 1 tablet (25 mg total) by mouth 3 (three) times daily as needed for dizziness. 09/26/16   Nita Sickle, MD    Allergies Levaquin [levofloxacin in d5w] and Penicillins  No family history on file.  Social History Social History  Substance Use Topics  . Smoking status: Never Smoker  . Smokeless tobacco: Never Used  . Alcohol use No    Review of Systems  Constitutional: No fever. Eyes: No visual changes. ENT: No neck pain.  Cardiovascular: Denies chest pain. Respiratory: Denies shortness of breath. Gastrointestinal: Positive for nausea.  Genitourinary: Negative for dysuria.  Musculoskeletal: Negative for back pain. Skin: Negative for rash. Neurological: Positive for  vertigo.   ____________________________________________   PHYSICAL EXAM:  VITAL SIGNS: ED Triage Vitals [11/17/16 0745]  Enc Vitals Group     BP      Pulse      Resp      Temp      Temp src      SpO2      Weight 270 lb (122.5 kg)     Height  (1.626 m)     Head Circumference      Peak Flow      Pain Score 0     Pain Loc      Pain Edu?      Excl. in GC?     Constitutional: Alert and oriented. Well appearing and in no acute distress. Eyes: Conjunctivae are normal. EOMI.  PERRLA.  Head: Atraumatic. Nose: No congestion/rhinnorhea. Mouth/Throat: Mucous membranes are dry.  Neck: Normal range of motion.  Cardiovascular: Normal rate, regular rhythm. Grossly normal heart sounds.  Good peripheral circulation. Respiratory: Normal respiratory effort.  No retractions. Lungs CTAB. Gastrointestinal: Soft and nontender. No distention.  Genitourinary: No flank tenderness. Musculoskeletal: No lower extremity edema.  Extremities warm and well perfused.  Neurologic:  Normal speech and language. No gross focal neurologic deficits are appreciated. Cranial nerves III-XII intact.  Normal coordination and finger to nose.  Skin:  Skin is warm and dry. No rash noted. Psychiatric: Mood and affect are normal. Speech and behavior are normal.  ____________________________________________   LABS (all labs ordered are listed, but only abnormal results are displayed)  Labs Reviewed  BASIC METABOLIC PANEL - Abnormal; Notable for the following:  Result Value   Glucose, Bld 169 (*)    All other components within normal limits  URINALYSIS, COMPLETE (UACMP) WITH MICROSCOPIC - Abnormal; Notable for the following:    Color, Urine YELLOW (*)    APPearance HAZY (*)    Squamous Epithelial / LPF 0-5 (*)    All other components within normal limits  CBC   ____________________________________________  EKG  ED ECG REPORT I, Dionne Bucy, the attending physician, personally viewed and  interpreted this ECG.  Date: 11/17/2016 EKG Time: 752 Rate: 90 Rhythm: normal sinus rhythm QRS Axis: normal Intervals: normal ST/T Wave abnormalities: normal Narrative Interpretation: no evidence of acute ischemia  ____________________________________________  RADIOLOGY    ____________________________________________   PROCEDURES  Procedure(s) performed: No    Critical Care performed: No ____________________________________________   INITIAL IMPRESSION / ASSESSMENT AND PLAN / ED COURSE  Pertinent labs & imaging results that were available during my care of the patient were reviewed by me and considered in my medical decision making (see chart for details).  36 year old female with past history of BPPV presents with vertigo for one day, similar to prior episodes, and with no other significant associated symptoms except for lightheadedness. In the ED, vital signs are normal, patient is relatively well-appearing, and the neuro exam is unremarkable.  Past history reviewed in Epic and reveals similar visit in August 2018 for BPPV which resolved with meclizine.  Presentation today consistent with BPPV. There is no evidence of central vertigo or other concerning cause. No indication for imaging. Plan for symptomatic treatment with meclizine, fluids, and will obtain basic labs.    ----------------------------------------- 12:21 PM on 11/17/2016 -----------------------------------------  Patient had minimal improvement after initial dose of meclizine, however after getting Benadryl and Reglan she states she feels significantly better. Lab workup is unremarkable. At this time, patient safe for discharge home. Return precautions given. Will give prescription for meclizine and a neurology referral given she has had recurrent episodes of BPPV.  ____________________________________________   FINAL CLINICAL IMPRESSION(S) / ED DIAGNOSES  Final diagnoses:  Peripheral vertigo,  unspecified laterality      NEW MEDICATIONS STARTED DURING THIS VISIT:  New Prescriptions   No medications on file     Note:  This document was prepared using Dragon voice recognition software and may include unintentional dictation errors.    Dionne Bucy, MD 11/17/16 1222

## 2016-11-17 NOTE — Discharge Instructions (Signed)
Return to the ER for new or worsening dizziness, symptoms not controlled by the medication, headache, confusion, vision changes, or any other new or worsening symptoms that concern you. Follow-up with your primary care doctor, and with a neurologist in the next several weeks.

## 2016-11-17 NOTE — ED Triage Notes (Signed)
Patient presents to the ED with dizziness x 2 days.  Patient states dizziness has been intermittent but this morning was present when she woke up and has not gone away.  Patient reports occasionally feeling like she was going to pass out and that the room was spinning.  Patient reports occasional nausea.  Patient reports history of vertigo and denies taking any meclizine in the past few days.

## 2016-12-11 ENCOUNTER — Other Ambulatory Visit: Payer: Self-pay

## 2016-12-11 ENCOUNTER — Encounter: Payer: Self-pay | Admitting: Emergency Medicine

## 2016-12-11 ENCOUNTER — Emergency Department
Admission: EM | Admit: 2016-12-11 | Discharge: 2016-12-11 | Disposition: A | Discharge status: Home / Self Care | Payer: Self-pay | Attending: Emergency Medicine | Admitting: Emergency Medicine

## 2016-12-11 DIAGNOSIS — Z79899 Other long term (current) drug therapy: Secondary | ICD-10-CM | POA: Insufficient documentation

## 2016-12-11 DIAGNOSIS — M25522 Pain in left elbow: Secondary | ICD-10-CM | POA: Insufficient documentation

## 2016-12-11 MED ORDER — NAPROXEN 500 MG PO TABS: 500.0000 mg | ORAL_TABLET | Freq: Two times a day (BID) | ORAL | 0 refills | Status: DC

## 2016-12-11 NOTE — ED Triage Notes (Signed)
Pt presents with left elbow pain, worse when she extends her arm out. Pt states she does a lot of repetitive work with her hands. Full ROM, NAD.

## 2016-12-11 NOTE — Discharge Instructions (Signed)
Follow up with primary care of your choice or [facility] if any continued problems. Begin taking naproxen 500 mg twice a day with food for 2 weeks.  You may use ice to the elbow nightly.

## 2016-12-11 NOTE — ED Triage Notes (Signed)
Pt to ED via POV with c/o LFT arm pain [id number], states getting worse. Pt has full ROM with affected arm, ambulatory, NAD noted at this time

## 2016-12-11 NOTE — ED Provider Notes (Signed)
[facility] Emergency Department Provider Note  ____________________________________________   First MD Initiated Contact with Patient 12/11/16 [phone]     (approximate)  I have reviewed the triage vital signs and the nursing notes.   HISTORY  Chief Complaint Arm Injury  HPI Pamela Rasmussen is a 36 y.o. is here with complaint of left elbow pain for one month. Patient denies any injury. She has not seen a provider prior to this visit for her left arm pain. She states that this been hurting off and on for one month.she has infrequently take in Tylenol and infrequently places some ice on it which makes it feel better temporarily. She rates her pain as an 8 out of 10.   Past Medical History:  Diagnosis Date  . Bipolar 1 disorder (HCC)   . Vertigo     There are no active problems to display for this patient.   Past Surgical History:  Procedure Laterality Date  . CHOLECYSTECTOMY      Prior to Admission medications   Medication Sig Start Date End Date Taking? Authorizing Provider  meclizine (ANTIVERT) 25 MG tablet Take 1 tablet (25 mg total) by mouth 3 (three) times daily as needed for dizziness. 11/17/16 12/17/16  Dionne BucySiadecki, Sebastian, MD  naproxen (NAPROSYN) 500 MG tablet Take 1 tablet (500 mg total) 2 (two) times daily with a meal by mouth. 12/11/16   Tommi RumpsSummers, Daniyah Fohl L, PA-C  ondansetron (ZOFRAN) 4 MG tablet Take 1 tablet (4 mg total) by mouth every 8 (eight) hours as needed for nausea or vomiting. 11/17/16 11/17/17  Dionne BucySiadecki, Sebastian, MD    Allergies Levaquin [levofloxacin in d5w] and Penicillins  No family history on file.  Social History Social History   Tobacco Use  . Smoking status: Never Smoker  . Smokeless tobacco: Never Used  Substance Use Topics  . Alcohol use: No  . Drug use: No    Review of Systems Constitutional: No fever/chills Cardiovascular: Denies chest pain. Respiratory: Denies shortness of breath. Gastrointestinal:   No nausea, no vomiting.   Musculoskeletal: positive for left arm pain Skin: Negative for rash. Neurological: Negative for  focal weakness or numbness. ____________________________________________   PHYSICAL EXAM:  VITAL SIGNS: ED Triage Vitals  Enc Vitals Group     BP 12/11/16 0748 (!) 160/100     Pulse Rate 12/11/16 0747 98     Resp 12/11/16 0747 16     Temp 12/11/16 0747 98.2 F (36.8 C)     Temp Source 12/11/16 0747 Oral     SpO2 12/11/16 0747 97 %     Weight 12/11/16 0741 270 lb (122.5 kg)     Height 12/11/16 0748 5\' 4"  (1.626 m)     Head Circumference --      Peak Flow --      Pain Score 12/11/16 0741 8     Pain Loc --      Pain Edu? --      Excl. in GC? --     Constitutional: Alert and oriented. Well appearing and in no acute distress. Eyes: Conjunctivae are normal.  Head: Atraumatic. Nose: No congestion/rhinnorhea. Neck: No stridor.   Cardiovascular: Normal rate, regular rhythm. Grossly normal heart sounds.  Good peripheral circulation. Respiratory: Normal respiratory effort.  No retractions. Lungs CTAB. Musculoskeletal: on examination of the left elbow there is no gross deformity and patient is able to flex, extend, and rotate without any difficulty. There is some soft tissue tenderness but no edema. There is no  ecchymosis or abrasions. No erythema was noted. Skin is not warm to touch. Neurologic:  Normal speech and language. No gross focal neurologic deficits are appreciated.  Skin:  Skin is warm, dry and intact. No rash noted. Psychiatric: Mood and affect are normal. Speech and behavior are normal.  ____________________________________________   LABS (all labs ordered are listed, but only abnormal results are displayed)  Labs Reviewed - No data to display  PROCEDURES  Procedure(s) performed: None  Procedures  Critical Care performed: No  ____________________________________________   INITIAL IMPRESSION / ASSESSMENT AND PLAN / ED COURSE Patient  was given a prescription for naproxen 500 mg twice a day with food. She is encouraged to use ice to the area as needed for inflammation. [facility] or PCP of her choice if any continued problems.  ____________________________________________   FINAL CLINICAL IMPRESSION(S) / ED DIAGNOSES  Final diagnoses:  Left elbow pain     ED Discharge Orders        Ordered    naproxen (NAPROSYN) 500 MG tablet  2 times daily with meals     12/11/16 0823       Note:  This document was prepared using Dragon voice recognition software and may include unintentional dictation errors.         Tommi RumpsSummers, Tamzin Bertling L, PA-C 12/11/16 1558    Minna AntisPaduchowski, Kevin, MD 12/12/16 [phone]

## 2016-12-26 ENCOUNTER — Emergency Department
Admission: EM | Admit: 2016-12-26 | Discharge: 2016-12-26 | Disposition: A | Discharge status: Home / Self Care | Payer: Self-pay | Attending: Emergency Medicine | Admitting: Emergency Medicine

## 2016-12-26 ENCOUNTER — Encounter: Payer: Self-pay | Admitting: Emergency Medicine

## 2016-12-26 ENCOUNTER — Other Ambulatory Visit: Payer: Self-pay

## 2016-12-26 DIAGNOSIS — M7702 Medial epicondylitis, left elbow: Secondary | ICD-10-CM | POA: Insufficient documentation

## 2016-12-26 MED ORDER — PREDNISONE 10 MG PO TABS: ORAL_TABLET | ORAL | 0 refills | Status: DC

## 2016-12-26 NOTE — ED Notes (Signed)
Pt presents today with Left Arm pain. Pt states she was here 2 weeks ago and was dx with tendinitis in the Left elbow. The Pain not was re-occurred and moved to shoulder. Pt is A/O and is states pain is when she moves it out straight/

## 2016-12-26 NOTE — Discharge Instructions (Signed)
Follow-up with Dr. Odis LusterBowers if any continued problems. He is the orthoped[facility]. You will need call make an appointment if not improving in 2 weeks. Obtain the elbow brace that we discussed. Begin taking prednisone as directed. Today take 6 tablets today and tapering down. You may also use ice to your  elbow as needed to reduce pain.  Avoid lifting with left arm anything over 25 pounds.

## 2016-12-26 NOTE — ED Triage Notes (Signed)
Patient complaining left arm pain starting in elbow 2 weeks ago, pain now in shoulder.  Denies injury.  States that "when I go to pick something up, it feels like I'm going to drop it".  Feels "pulling" in arm when lays down to sleep.

## 2016-12-26 NOTE — ED Provider Notes (Signed)
[facility] Emergency Department Provider Note  ____________________________________________   First MD Initiated Contact with Patient 12/26/16 1028     (approximate)  I have reviewed the triage vital signs and the nursing notes.   HISTORY  Chief Complaint Arm Injury   HPI Pamela Rasmussen is a 36 y.o. female is here complaining of left elbow pain for the last 2 weeks. Patient denies any history of injury. She states that now when she picks up something she feels like she is going to drop it. She is been taking Advil one tablet daily for the last several days without any improvement. She was up pain as an 8 out of 10.   Past Medical History:  Diagnosis Date  . Bipolar 1 disorder (HCC)   . Vertigo     There are no active problems to display for this patient.   Past Surgical History:  Procedure Laterality Date  . CHOLECYSTECTOMY      Prior to Admission medications   Medication Sig Start Date End Date Taking? Authorizing Provider  predniSONE (DELTASONE) 10 MG tablet Take 6 tablets  today, on day 2 take 5 tablets, day 3 take 4 tablets, day 4 take 3 tablets, day 5 take  2 tablets and 1 tablet the last day 12/26/16   Tommi RumpsSummers, Rhonda L, PA-C    Allergies Levaquin [levofloxacin in d5w] and Penicillins  No family history on file.  Social History Social History   Tobacco Use  . Smoking status: Never Smoker  . Smokeless tobacco: Never Used  Substance Use Topics  . Alcohol use: No  . Drug use: No    Review of Systems Constitutional: No fever/chills Cardiovascular: Denies chest pain. Respiratory: Denies shortness of breath. Gastrointestinal:   No nausea, no vomiting.   Musculoskeletal: positive for left elbow pain. Skin: Negative for rash. Neurological: Negative for  focal weakness or numbness. ___________________________________________   PHYSICAL EXAM:  VITAL SIGNS: ED Triage Vitals  Enc Vitals Group     BP 12/26/16 1009  136/68     Pulse Rate 12/26/16 1009 83     Resp 12/26/16 1009 16     Temp 12/26/16 1009 98.2 F (36.8 C)     Temp Source 12/26/16 1009 Oral     SpO2 12/26/16 1009 95 %     Weight 12/26/16 1012 267 lb (121.1 kg)     Height 12/26/16 1012 5\' 4"  (1.626 m)     Head Circumference --      Peak Flow --      Pain Score 12/26/16 1008 8     Pain Loc --      Pain Edu? --      Excl. in GC? --    Constitutional: Alert and oriented. Well appearing and in no acute distress. Eyes: Conjunctivae are normal.  Head: Atraumatic. Neck: No stridor.   Cardiovascular: Normal rate, regular rhythm. Grossly normal heart sounds.  Good peripheral circulation. Respiratory: Normal respiratory effort.  No retractions. Lungs CTAB. Musculoskeletal:  On examination of the elbow there is no gross deformity patient is able to flex and extend elbow without any difficulty. She is also able to rotate without restriction. There is moderate tenderness on palpation of the medial aspect of the epicondylar region. There is no erythema or soft tissue swelling. There is no tenderness on palpation of the lateral epicondylar region. Pain is increased with grip strength. Grip strength is equal bilaterally. No difficulty with range of motion of the shoulder. Neurologic:  Normal speech and language. No gross focal neurologic deficits are appreciated. No gait instability. Skin:  Skin is warm, dry and intact. No rash noted. Psychiatric: Mood and affect are normal. Speech and behavior are normal.  ____________________________________________   LABS (all labs ordered are listed, but only abnormal results are displayed)  Labs Reviewed - No data to display   PROCEDURES  Procedure(s) performed: None  Procedures  Critical Care performed: No  ____________________________________________   INITIAL IMPRESSION / ASSESSMENT AND PLAN / ED COURSE Patient has been taking in adequate doses of anti-inflammatories. We discussed a six-day  course of prednisone and she will also [facility]. Ice as needed for pain. She was given a note for work and also to return to work tomorrow with restriction of no lifting over 25 pounds. ____________________________________________   FINAL CLINICAL IMPRESSION(S) / ED DIAGNOSES  Final diagnoses:  Medial epicondylitis of left elbow     ED Discharge Orders        Ordered    predniSONE (DELTASONE) 10 MG tablet     12/26/16 1046       Note:  This document was prepared using Dragon voice recognition software and may include unintentional dictation errors.    Tommi RumpsSummers, Rhonda L, PA-C 12/26/16 1102    Emily FilbertWilliams, Jonathan E, MD 12/26/16 1242

## 2017-01-03 ENCOUNTER — Emergency Department: Payer: Self-pay

## 2017-01-03 ENCOUNTER — Emergency Department
Admission: EM | Admit: 2017-01-03 | Discharge: 2017-01-03 | Disposition: A | Discharge status: Home / Self Care | Payer: Self-pay | Attending: Emergency Medicine | Admitting: Emergency Medicine

## 2017-01-03 ENCOUNTER — Encounter: Payer: Self-pay | Admitting: Emergency Medicine

## 2017-01-03 ENCOUNTER — Other Ambulatory Visit: Payer: Self-pay

## 2017-01-03 DIAGNOSIS — Z79899 Other long term (current) drug therapy: Secondary | ICD-10-CM | POA: Insufficient documentation

## 2017-01-03 DIAGNOSIS — M25522 Pain in left elbow: Secondary | ICD-10-CM | POA: Insufficient documentation

## 2017-01-03 MED ORDER — CYCLOBENZAPRINE HCL 10 MG PO TABS: 10.0000 mg | ORAL_TABLET | Freq: Three times a day (TID) | ORAL | 0 refills | Status: DC | PRN

## 2017-01-03 MED ORDER — MELOXICAM 15 MG PO TABS: 15.0000 mg | ORAL_TABLET | Freq: Every day | ORAL | 0 refills | Status: DC

## 2017-01-03 NOTE — Discharge Instructions (Signed)
Please follow-up with orthopedics

## 2017-01-03 NOTE — ED Triage Notes (Signed)
Pt c/o pain to left elbow but only when lifting things. Has been present X 1 month and also initially present in left shoulder but this went away with prednisone given. Pt has not F/U with [facility] as instructed because reports does not have insurance yet.  Seen before for same problem just not getting better.

## 2017-01-03 NOTE — ED Notes (Signed)
See triage note  Presents with pain to left elbow for about 1-2 months  Has been seen for same times 2   Was given naprosyn and prednisone  States pain is not any better  Denies any recent injury  No deformity noted

## 2017-01-03 NOTE — ED Provider Notes (Signed)
[facility] Emergency Department Provider Note ____________________________________________  Time seen: Approximately 7:32 AM  I have reviewed the triage vital signs and the nursing notes.   HISTORY  Chief Complaint elbow pain    HPI Pamela Rasmussen is a 36 y.o. female who presents to the emergency department for evaluation of left elbow pain. She denies specific injury or point of onset. She is currently tapering off a steroid dose pack but doesn't feel that it has worked very well. Pain is nonradiating and increases with full extension and rotation.  Past Medical History:  Diagnosis Date  . Bipolar 1 disorder (HCC)   . Vertigo     There are no active problems to display for this patient.   Past Surgical History:  Procedure Laterality Date  . CHOLECYSTECTOMY      Prior to Admission medications   Medication Sig Start Date End Date Taking? Authorizing Provider  OLANZapine (ZYPREXA) 10 MG tablet Take 10 mg by mouth at bedtime.   Yes [provider]  cyclobenzaprine (FLEXERIL) 10 MG tablet Take 1 tablet (10 mg total) by mouth 3 (three) times daily as needed for muscle spasms. 01/03/17   Pia Jedlicka, Rulon Eisenmengerari B, FNP  meloxicam (MOBIC) 15 MG tablet Take 1 tablet (15 mg total) by mouth daily. 01/03/17   Jakyah Bradby, Rulon Eisenmengerari B, FNP  predniSONE (DELTASONE) 10 MG tablet Take 6 tablets  today, on day 2 take 5 tablets, day 3 take 4 tablets, day 4 take 3 tablets, day 5 take  2 tablets and 1 tablet the last day 12/26/16   Tommi RumpsSummers, Rhonda L, PA-C    Allergies Levaquin [levofloxacin in d5w] and Penicillins  History reviewed. No pertinent family history.  Social History Social History   Tobacco Use  . Smoking status: Never Smoker  . Smokeless tobacco: Never Used  Substance Use Topics  . Alcohol use: No  . Drug use: No    Review of Systems Constitutional: Negative for recent injury Cardiovascular: Negative for chest pain Respiratory: Negative for  shortness of breath Musculoskeletal: Positive for left elbow pain Skin: Negative for rash, lesion, or wound.  Neurological: Negative for paresthesias  ____________________________________________   PHYSICAL EXAM:  VITAL SIGNS: ED Triage Vitals  Enc Vitals Group     BP 01/03/17 0715 136/78     Pulse Rate 01/03/17 0715 (!) 102     Resp 01/03/17 0715 16     Temp 01/03/17 0715 98.4 F (36.9 C)     Temp Source 01/03/17 0715 Oral     SpO2 01/03/17 0715 96 %     Weight 01/03/17 0715 267 lb (121.1 kg)     Height 01/03/17 0715 5\' 4"  (1.626 m)     Head Circumference --      Peak Flow --      Pain Score 01/03/17 0713 7     Pain Loc --      Pain Edu? --      Excl. in GC? --     Constitutional: Alert and oriented. Well appearing and in no acute distress. Eyes: Conjunctivae are clear without discharge or drainage Head: Atraumatic Neck: Full, active ROM Respiratory: Respirations are even and unlabored. Musculoskeletal: Point tender over the left lateral epicondyle. Strength 5/5 of the left elbow and wrist. Performs active ROM without restriction. Neurologic: Sensation intact  Skin: Warm and dry without rash lesion or wound  Psychiatric: Affect and behavior appropriate.  ____________________________________________   LABS (all labs ordered are listed, but only abnormal results are  displayed)  Labs Reviewed - No data to display ____________________________________________  RADIOLOGY  Left elbow negative for acute bony abnormality per radiology. ____________________________________________   PROCEDURES  Procedures  ____________________________________________   INITIAL IMPRESSION / ASSESSMENT AND PLAN / ED COURSE  Pamela Rasmussen is a 36 y.o. female who presents for a 3rd evaluation of left elbow pain. X-rays are negative for acute abnormality. She is to follow up with orthopedics in January when she gets health insurance. She was advised to return to the ER for  symptoms that change or worsen.  Medications - No data to display  Pertinent labs & imaging results that were available during my care of the patient were reviewed by me and considered in my medical decision making (see chart for details).  _________________________________________   FINAL CLINICAL IMPRESSION(S) / ED DIAGNOSES  Final diagnoses:  Left elbow pain    ED Discharge Orders        Ordered    meloxicam (MOBIC) 15 MG tablet  Daily     01/03/17 0812    cyclobenzaprine (FLEXERIL) 10 MG tablet  3 times daily PRN     01/03/17 [id number]       If controlled substance prescribed during this visit, 12 month history viewed on the [facility] prior to issuing an initial prescription for Schedule II or III opiod.    Chinita Pesterriplett, Ayvah Caroll B, FNP 01/03/17 [id number]    Sharyn CreamerQuale, Mark, MD 01/03/17 1515

## 2017-05-01 ENCOUNTER — Emergency Department
Admission: EM | Admit: 2017-05-01 | Discharge: 2017-05-01 | Disposition: A | Discharge status: Home / Self Care | Payer: BLUE CROSS/BLUE SHIELD | Attending: Emergency Medicine | Admitting: Emergency Medicine

## 2017-05-01 ENCOUNTER — Telehealth: Payer: Self-pay | Admitting: Gastroenterology

## 2017-05-01 ENCOUNTER — Encounter: Payer: Self-pay | Admitting: Emergency Medicine

## 2017-05-01 ENCOUNTER — Other Ambulatory Visit: Payer: Self-pay

## 2017-05-01 DIAGNOSIS — Z79899 Other long term (current) drug therapy: Secondary | ICD-10-CM | POA: Insufficient documentation

## 2017-05-01 DIAGNOSIS — K625 Hemorrhage of anus and rectum: Secondary | ICD-10-CM | POA: Diagnosis present

## 2017-05-01 DIAGNOSIS — Z86010 Personal history of colonic polyps: Secondary | ICD-10-CM | POA: Insufficient documentation

## 2017-05-01 LAB — CBC
HCT: 43 % (ref 35.0–47.0)
HEMOGLOBIN: 14.5 g/dL (ref 12.0–16.0)
MCH: 31.5 pg (ref 26.0–34.0)
MCHC: 33.7 g/dL (ref 32.0–36.0)
MCV: 93.4 fL (ref 80.0–100.0)
Platelets: 265 10*3/uL (ref 150–440)
RBC: 4.61 MIL/uL (ref 3.80–5.20)
RDW: 13.3 % (ref 11.5–14.5)
WBC: 11.9 10*3/uL — ABNORMAL HIGH (ref 3.6–11.0)

## 2017-05-01 LAB — BASIC METABOLIC PANEL
ANION GAP: 9 (ref 5–15)
BUN: 10 mg/dL (ref 6–20)
CO2: 21 mmol/L — AB (ref 22–32)
Calcium: 9.2 mg/dL (ref 8.9–10.3)
Chloride: 108 mmol/L (ref 101–111)
Creatinine, Ser: 0.92 mg/dL (ref 0.44–1.00)
GFR calc Af Amer: >60 mL/min (ref >60)
GFR calc non Af Amer: >60 mL/min (ref >60)
GLUCOSE: 99 mg/dL (ref 65–99)
Potassium: 3.6 mmol/L (ref 3.5–5.1)
Sodium: 138 mmol/L (ref 135–145)

## 2017-05-01 LAB — PROTIME-INR
INR: 0.92
PROTHROMBIN TIME: 12.3 s (ref 11.4–15.2)

## 2017-05-01 LAB — HCG, QUANTITATIVE, PREGNANCY: hCG, Beta Chain, Quant, S: <1 m[IU]/mL (ref <5)

## 2017-05-01 NOTE — ED Notes (Signed)
Pt alert and oriented X4, active, cooperative, pt in NAD. RR even and unlabored, color WNL.  Pt informed to return if any life threatening symptoms occur.  Discharge and followup instructions reviewed. followup care discussed.

## 2017-05-01 NOTE — Telephone Encounter (Signed)
Left vm for pt to offer Monday April 2nd  apt for Urgent referral

## 2017-05-01 NOTE — ED Triage Notes (Signed)
C/O rectal bleeding with BM for the past couple of weeks.  Has had colonoscopy in the past for polyps.

## 2017-05-01 NOTE — ED Provider Notes (Signed)
[facility] Emergency Department Provider Note ____________________________________________   First MD Initiated Contact with Patient 05/01/17 0730     (approximate)  I have reviewed the triage vital signs and the nursing notes.   HISTORY  Chief Complaint Rectal Bleeding  HPI Pamela Rasmussen is a 37 y.o. female history of bipolar disorder, previous vertigo, cholecystectomy she reports several years ago colonoscopy that showed a few polyps that had to be removed.  Patient presents today, reports that for about 2 weeks now she is noted that she has had a small amount of blood in her stool on the toilet paper after wiping after each bowel movement.  She has been experiencing small amounts of blood in her stool for about the last 2 weeks.  No fevers or chills.  No vomiting.  No abdominal pain.  Reports only small amount, and she thinks she may have another polyp or potentially hemorrhoid though she has not felt 1.  Denies pregnancy, currently on Depo-Provera  Does not take any blood thinners.  She does not take any pain medications, no NSAIDs no aspirin no ibuprofen etc.  Not associated any lightheadedness weakness dizziness headaches or other concerns.  No travel history.  No recent antibiotics  Past Medical History:  Diagnosis Date  . Bipolar 1 disorder (HCC)   . Vertigo     There are no active problems to display for this patient.   Past Surgical History:  Procedure Laterality Date  . CHOLECYSTECTOMY      Medications: Depo-Provera  Allergies Levaquin [levofloxacin in d5w] and Penicillins  No family history on file.  Social History Social History   Tobacco Use  . Smoking status: Never Smoker  . Smokeless tobacco: Never Used  Substance Use Topics  . Alcohol use: No  . Drug use: No    Review of Systems Constitutional: No fever/chills Eyes: No visual changes. ENT: No sore throat. Cardiovascular: Denies chest pain. Respiratory:  Denies shortness of breath. Gastrointestinal: No abdominal pain.  No nausea, no vomiting.  No diarrhea.  No constipation. Genitourinary: Negative for dysuria. Musculoskeletal: Negative for back pain. Skin: Negative for rash. Neurological: Negative for headaches, focal weakness or numbness.    ____________________________________________   PHYSICAL EXAM:  VITAL SIGNS: ED Triage Vitals  Enc Vitals Group     BP 05/01/17 0724 (!) 157/80     Pulse Rate 05/01/17 0724 86     Resp 05/01/17 0724 16     Temp 05/01/17 0724 98.8 F (37.1 C)     Temp Source 05/01/17 0724 Oral     SpO2 05/01/17 0724 97 %     Weight 05/01/17 0724 265 lb (120.2 kg)     Height 05/01/17 0724 5\' 4"  (1.626 m)     Head Circumference --      Peak Flow --      Pain Score 05/01/17 0723 0     Pain Loc --      Pain Edu? --      Excl. in GC? --     Constitutional: Alert and oriented. Well appearing and in no acute distress. Eyes: Conjunctivae are normal. Head: Atraumatic. Nose: No congestion/rhinnorhea. Mouth/Throat: Mucous membranes are moist. Neck: No stridor.   Cardiovascular: Normal rate, regular rhythm. Grossly normal heart sounds.  Good peripheral circulation. Respiratory: Normal respiratory effort.  No retractions. Lungs CTAB. Gastrointestinal: Soft and nontender. No distention. Rectal exam: Performed with nurse Marylene LandAngela.  Normal external exam, internal digital exam does not demonstrate any mass or  obvious internal hemorrhoid.  No external hemorrhoids are noted.  There is no blood or fecal matter denoted in the rectal vault.  Hemoccult done on small amount of mucus that was present is negative for blood.  Control positive. Musculoskeletal: No lower extremity tenderness nor edema. Neurologic:  Normal speech and language. No gross focal neurologic deficits are appreciated.  Skin:  Skin is warm, dry and intact. No rash noted. Psychiatric: Mood and affect are normal. Speech and behavior are  normal.  ____________________________________________   LABS (all labs ordered are listed, but only abnormal results are displayed)  Labs Reviewed  CBC - Abnormal; Notable for the following components:      Result Value   WBC 11.9 (*)    All other components within normal limits  BASIC METABOLIC PANEL - Abnormal; Notable for the following components:   CO2 21 (*)    All other components within normal limits  PROTIME-INR  HCG, QUANTITATIVE, PREGNANCY   ____________________________________________  EKG   ____________________________________________  RADIOLOGY   ____________________________________________   PROCEDURES  Procedure(s) performed: None  Procedures  Critical Care performed: No  ____________________________________________   INITIAL IMPRESSION / ASSESSMENT AND PLAN / ED COURSE  Pertinent labs & imaging results that were available during my care of the patient were reviewed by me and considered in my medical decision making (see chart for details).  Patient presents for evaluation of painless rectal bleeding.  Appears to be small amount in nature, seen primarily small amounts of blood on tissue paper after bowel movements.  Does have a history of polyps with previous polypectomies reported, her history does not seem to suggest that of an upper GI bleed, she is at low risk for diverticulosis, and she has no evidence support a large bleed, no evidence of hemodynamic instability.  We will check a CBC and coags.  No indication for emergent imaging denoted.  Discussed with the patient and I highly recommended that she needs to follow-up with gastroenterology and may need further evaluation including a possible evaluation such as another colonoscopy given her history of polyps now with having bleeding again.  Patient is very amenable to this plan, she is agreeable to a plan for close follow-up with GI.  We also discussed careful return precautions including signs and  symptoms of increasing bleeding, lightheadedness weakness chest pain etc.  Patient is very amenable to this.  She appears appropriate for outpatient follow-up.  Advised her against initiating any aspirin, ibuprofen or NSAIDs.  I do not see a clear indication to initiate a PPI at this time.     ----------------------------------------- 9:01 AM on 05/01/2017 -----------------------------------------  Return precautions and treatment recommendations and follow-up discussed with the patient who is agreeable with the plan.  Patient planning to discuss with GI clinic today for follow-up and will set up an appointment.  Resting comfortably. ____________________________________________   FINAL CLINICAL IMPRESSION(S) / ED DIAGNOSES  Final diagnoses:  Rectal bleeding      NEW MEDICATIONS STARTED DURING THIS VISIT:  New Prescriptions   No medications on file     Note:  This document was prepared using Dragon voice recognition software and may include unintentional dictation errors.     Sharyn Creamer, MD 05/01/17 [phone]

## 2017-05-04 ENCOUNTER — Ambulatory Visit: Payer: BLUE CROSS/BLUE SHIELD | Admitting: Gastroenterology

## 2017-05-04 ENCOUNTER — Encounter: Payer: Self-pay | Admitting: Gastroenterology

## 2017-05-05 ENCOUNTER — Other Ambulatory Visit
Admission: RE | Admit: 2017-05-05 | Discharge: 2017-05-05 | Disposition: A | Discharge status: Home / Self Care | Payer: BLUE CROSS/BLUE SHIELD | Source: Ambulatory Visit | Attending: Internal Medicine | Admitting: Internal Medicine

## 2017-05-05 DIAGNOSIS — Z8719 Personal history of other diseases of the digestive system: Secondary | ICD-10-CM | POA: Insufficient documentation

## 2017-05-05 DIAGNOSIS — R197 Diarrhea, unspecified: Secondary | ICD-10-CM | POA: Diagnosis present

## 2017-05-05 DIAGNOSIS — F3130 Bipolar disorder, current episode depressed, mild or moderate severity, unspecified: Secondary | ICD-10-CM | POA: Diagnosis present

## 2017-05-05 DIAGNOSIS — Z6841 Body Mass Index (BMI) 40.0 and over, adult: Secondary | ICD-10-CM | POA: Insufficient documentation

## 2017-05-05 DIAGNOSIS — Z7689 Persons encountering health services in other specified circumstances: Secondary | ICD-10-CM | POA: Diagnosis present

## 2017-05-05 DIAGNOSIS — F319 Bipolar disorder, unspecified: Secondary | ICD-10-CM | POA: Insufficient documentation

## 2017-05-05 LAB — LITHIUM LEVEL: Lithium Lvl: 0.13 mmol/L — ABNORMAL LOW (ref 0.60–1.20)

## 2017-06-04 ENCOUNTER — Other Ambulatory Visit
Admission: RE | Admit: 2017-06-04 | Discharge: 2017-06-04 | Disposition: A | Discharge status: Home / Self Care | Payer: BLUE CROSS/BLUE SHIELD | Source: Ambulatory Visit | Attending: Internal Medicine | Admitting: Internal Medicine

## 2017-06-04 DIAGNOSIS — R7889 Finding of other specified substances, not normally found in blood: Secondary | ICD-10-CM | POA: Diagnosis not present

## 2017-06-04 LAB — LITHIUM LEVEL: Lithium Lvl: 0.53 mmol/L — ABNORMAL LOW (ref 0.60–1.20)

## 2017-09-01 ENCOUNTER — Other Ambulatory Visit
Admission: RE | Admit: 2017-09-01 | Discharge: 2017-09-01 | Disposition: A | Discharge status: Home / Self Care | Payer: BLUE CROSS/BLUE SHIELD | Source: Ambulatory Visit | Attending: Internal Medicine | Admitting: Internal Medicine

## 2017-09-01 DIAGNOSIS — R7889 Finding of other specified substances, not normally found in blood: Secondary | ICD-10-CM | POA: Diagnosis present

## 2017-09-01 DIAGNOSIS — Z79899 Other long term (current) drug therapy: Secondary | ICD-10-CM | POA: Insufficient documentation

## 2017-09-01 DIAGNOSIS — F319 Bipolar disorder, unspecified: Secondary | ICD-10-CM | POA: Diagnosis present

## 2017-09-01 LAB — LITHIUM LEVEL: LITHIUM LVL: 0.55 mmol/L — AB (ref 0.60–1.20)

## 2018-01-04 ENCOUNTER — Encounter: Payer: Self-pay | Admitting: Psychiatry

## 2018-01-04 ENCOUNTER — Ambulatory Visit (INDEPENDENT_AMBULATORY_CARE_PROVIDER_SITE_OTHER): Payer: BLUE CROSS/BLUE SHIELD | Admitting: Psychiatry

## 2018-01-04 ENCOUNTER — Other Ambulatory Visit: Payer: Self-pay

## 2018-01-04 VITALS — BP 135/80 | HR 91 | Temp 99.6°F | Wt 251.2 lb

## 2018-01-04 DIAGNOSIS — F3162 Bipolar disorder, current episode mixed, moderate: Secondary | ICD-10-CM | POA: Diagnosis not present

## 2018-01-04 DIAGNOSIS — F4312 Post-traumatic stress disorder, chronic: Secondary | ICD-10-CM

## 2018-01-04 DIAGNOSIS — F4010 Social phobia, unspecified: Secondary | ICD-10-CM

## 2018-01-04 MED ORDER — HYDROXYZINE PAMOATE 25 MG PO CAPS: 25.0000 mg | ORAL_CAPSULE | Freq: Three times a day (TID) | ORAL | 1 refills | Status: DC | PRN

## 2018-01-04 MED ORDER — DIVALPROEX SODIUM ER 250 MG PO TB24: 250.0000 mg | ORAL_TABLET | ORAL | 0 refills | Status: DC

## 2018-01-04 MED ORDER — LITHIUM CARBONATE ER 450 MG PO TBCR: 450.0000 mg | EXTENDED_RELEASE_TABLET | Freq: Every day | ORAL | 0 refills | Status: DC
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
## 2018-01-04 NOTE — Patient Instructions (Signed)
Valproic Acid, Divalproex Sodium delayed or extended-release tablets What is this medicine? DIVALPROEX SODIUM (dye VAL pro ex SO dee um) is used to prevent seizures caused by some forms of epilepsy. It is also used to treat bipolar mania and to prevent migraine headaches. This medicine may be used for other purposes; ask your health care provider or pharmacist if you have questions. COMMON BRAND NAME(S): Depakote, Depakote ER What should I tell my health care provider before I take this medicine? They need to know if you have any of these conditions: -if you often drink alcohol -kidney disease -liver disease -low platelet counts -mitochondrial disease -suicidal thoughts, plans, or attempt; a previous suicide attempt by you or a family member -urea cycle disorder (UCD) -an unusual or allergic reaction to divalproex sodium, sodium valproate, valproic acid, other medicines, foods, dyes, or preservatives -pregnant or trying to get pregnant -breast-feeding How should I use this medicine? Take this medicine by mouth with a drink of water. Follow the directions on the prescription label. Do not cut, crush or chew this medicine. You can take it with or without food. If it upsets your stomach, take it with food. Take your medicine at regular intervals. Do not take it more often than directed. Do not stop taking except on your doctor's advice. A special MedGuide will be given to you by the pharmacist with each prescription and refill. Be sure to read this information carefully each time. Talk to your pediatrician regarding the use of this medicine in children. While this drug may be prescribed for children as young as 10 years for selected conditions, precautions do apply. Overdosage: If you think you have taken too much of this medicine contact a poison control center or emergency room at once. NOTE: This medicine is only for you. Do not share this medicine with others. What if I miss a dose? If you  miss a dose, take it as soon as you can. If it is almost time for your next dose, take only that dose. Do not take double or extra doses. What may interact with this medicine? Do not take this medicine with any of the following medications: -sodium phenylbutyrate This medicine may also interact with the following medications: -aspirin -certain antibiotics like ertapenem, imipenem, meropenem -certain medicines for depression, anxiety, or psychotic disturbances -certain medicines for seizures like carbamazepine, clonazepam, diazepam, ethosuximide, felbamate, lamotrigine, phenobarbital, phenytoin, primidone, rufinamide, topiramate -certain medicines that treat or prevent blood clots like warfarin -cholestyramine -female hormones, like estrogens and birth control pills, patches, or rings -propofol -rifampin -ritonavir -tolbutamide -zidovudine This list may not describe all possible interactions. Give your health care provider a list of all the medicines, herbs, non-prescription drugs, or dietary supplements you use. Also tell them if you smoke, drink alcohol, or use illegal drugs. Some items may interact with your medicine. What should I watch for while using this medicine? Tell your doctor or healthcare professional if your symptoms do not get better or they start to get worse. Wear a medical ID bracelet or chain, and carry a card that describes your disease and details of your medicine and dosage times. You may get drowsy, dizzy, or have blurred vision. Do not drive, use machinery, or do anything that needs mental alertness until you know how this medicine affects you. To reduce dizzy or fainting spells, do not sit or stand up quickly, especially if you are an older patient. Alcohol can increase drowsiness and dizziness. Avoid alcoholic drinks. This medicine can make you   more sensitive to the sun. Keep out of the sun. If you cannot avoid being in the sun, wear protective clothing and use  sunscreen. Do not use sun lamps or tanning beds/booths. Patients and their families should watch out for new or worsening depression or thoughts of suicide. Also watch out for sudden changes in feelings such as feeling anxious, agitated, panicky, irritable, hostile, aggressive, impulsive, severely restless, overly excited and hyperactive, or not being able to sleep. If this happens, especially at the beginning of treatment or after a change in dose, call your health care professional. Women should inform their doctor if they wish to become pregnant or think they might be pregnant. There is a potential for serious side effects to an unborn child. Talk to your health care professional or pharmacist for more information. Women who become pregnant while using this medicine may enroll in the [place] Antiepileptic Drug Pregnancy Registry by calling 1-888-233-2334. This registry collects information about the safety of antiepileptic drug use during pregnancy. What side effects may I notice from receiving this medicine? Side effects that you should report to your doctor or health care professional as soon as possible: -allergic reactions like skin rash, itching or hives, swelling of the face, lips, or tongue -changes in vision -redness, blistering, peeling or loosening of the skin, including inside the mouth -signs and symptoms of liver injury like dark yellow or brown urine; general ill feeling or flu-like symptoms; light-colored stools; loss of appetite; nausea; right upper belly pain; unusually weak or tired; yellowing of the eyes or skin -suicidal thoughts or other mood changes -unusual bleeding or bruising Side effects that usually do not require medical attention (report to your doctor or health care professional if they continue or are bothersome): -constipation -diarrhea -dizziness -hair loss -headache -loss of appetite -weight gain This list may not describe all possible side effects. Call  your doctor for medical advice about side effects. You may report side effects to FDA at 1-800-FDA-1088. Where should I keep my medicine? Keep out of reach of children. Store at room temperature between 15 and 30 degrees C (59 and 86 degrees F). Keep container tightly closed. Throw away any unused medicine after the expiration date. NOTE: This sheet is a summary. It may not cover all possible information. If you have questions about this medicine, talk to your doctor, pharmacist, or health care provider.  2018 Elsevier/Gold Standard (2015-04-26 07:11:40)  

## 2018-01-11 ENCOUNTER — Ambulatory Visit: Payer: BLUE CROSS/BLUE SHIELD | Admitting: Psychiatry

## 2018-01-11 ENCOUNTER — Other Ambulatory Visit: Payer: Self-pay | Admitting: Psychiatry

## 2018-01-12 ENCOUNTER — Ambulatory Visit: Payer: BLUE CROSS/BLUE SHIELD | Admitting: Psychiatry

## 2018-01-12 LAB — PROLACTIN: Prolactin: 6.9 ng/mL (ref 4.8–23.3)

## 2018-01-12 LAB — VALPROIC ACID LEVEL: VALPROIC ACID LVL: 62 ug/mL (ref 50–100)

## 2018-01-18 ENCOUNTER — Other Ambulatory Visit: Payer: Self-pay

## 2018-01-18 ENCOUNTER — Encounter: Payer: Self-pay | Admitting: Psychiatry

## 2018-01-18 ENCOUNTER — Ambulatory Visit (INDEPENDENT_AMBULATORY_CARE_PROVIDER_SITE_OTHER): Payer: BLUE CROSS/BLUE SHIELD | Admitting: Psychiatry

## 2018-01-18 VITALS — BP 138/80 | HR 97 | Temp 97.8°F | Wt 248.6 lb

## 2018-01-18 DIAGNOSIS — F3162 Bipolar disorder, current episode mixed, moderate: Secondary | ICD-10-CM

## 2018-01-18 DIAGNOSIS — F4010 Social phobia, unspecified: Secondary | ICD-10-CM | POA: Diagnosis not present

## 2018-01-18 DIAGNOSIS — F4312 Post-traumatic stress disorder, chronic: Secondary | ICD-10-CM

## 2018-01-18 MED ORDER — RISPERIDONE 0.5 MG PO TABS: 0.5000 mg | ORAL_TABLET | Freq: Every day | ORAL | 0 refills | Status: DC

## 2018-01-18 MED ORDER — DIVALPROEX SODIUM ER 500 MG PO TB24: 1000.0000 mg | ORAL_TABLET | Freq: Every day | ORAL | 0 refills | Status: DC

## 2018-01-18 NOTE — Progress Notes (Signed)
BH MD OP Progress Note  01/18/2018 9:32 AM Pamela Rasmussen  MRN:  [id number]  Chief Complaint: ' I am here for follow up.' Chief Complaint    Follow-up; Medication Refill; Anxiety; Depression; Medication Problem; Insomnia     HPI: Pamela BallRobin is a 37 year old Caucasian female, unemployed, single, lives in [place] with her mother, has a history of bipolar disorder, PTSD, social anxiety disorder, presented to the clinic today for a follow-up visit.  Patient today reports that she continues to struggle with some irritability on and off.  She reports that Depakote may be helping.  She denies any side effects to the Depakote.  Her Depakote labs came back and it was discussed with patient.  Her Depakote labs on 01/11/2018-therapeutic at 62.  Patient reports sleep is good.  Patient continues to struggle with paranoia especially in social situations.  She reports there are times when she feels she is being watched or followed.  Since patient continues to struggle with some irritability as well as possible psychotic symptoms , will add risperidone.  She agrees with plan.  Patient denies any suicidality.  Patient denies any homicidality.  Patient was referred for psychotherapy sessions however she has not made the appointment yet.  She reports she has financial issues and does not know if she wants to do it or not.  Patient denies any other concerns today. Visit Diagnosis:    ICD-10-CM   1. Bipolar 1 disorder, mixed, moderate (HCC) F31.62 divalproex (DEPAKOTE ER) 500 MG 24 hr tablet    risperiDONE (RISPERDAL) 0.5 MG tablet  2. Chronic post-traumatic stress disorder (PTSD) F43.12 divalproex (DEPAKOTE ER) 500 MG 24 hr tablet    risperiDONE (RISPERDAL) 0.5 MG tablet  3. Social anxiety disorder F40.10     Past Psychiatric History: I have reviewed past psychiatric history from my progress note on 01/04/2018.  Past trials of Lexapro, Celexa, olanzapine-could not afford, lithium, Wellbutrin, Depakote-  noncompliant due to not being able to afford it in the past.  Past Medical History:  Past Medical History:  Diagnosis Date  . Anxiety   . Bipolar 1 disorder (HCC)   . Vertigo     Past Surgical History:  Procedure Laterality Date  . CHOLECYSTECTOMY      Family Psychiatric History: Reviewed family psychiatric history from my progress note on 01/04/2018  Family History:  Family History  Problem Relation Age of Onset  . Bipolar disorder Mother   . Schizophrenia Maternal Aunt   . Bipolar disorder Paternal Grandmother   . Schizophrenia Paternal Grandmother     Social History: Reviewed social history from my progress note on 01/04/2018. Social History   Socioeconomic History  . Marital status: Significant Other    Spouse name: Not on file  . Number of children: 9  . Years of education: Not on file  . Highest education level: Associate degree: occupational, Scientist, product/process developmenttechnical, or vocational program  Occupational History    Comment: not employed  Social Needs  . Financial resource strain: Not hard at all  . Food insecurity:    Worry: Never true    Inability: Never true  . Transportation needs:    Medical: Yes    Non-medical: Yes  Tobacco Use  . Smoking status: Never Smoker  . Smokeless tobacco: Never Used  Substance and Sexual Activity  . Alcohol use: No  . Drug use: No  . Sexual activity: Yes  Lifestyle  . Physical activity:    Days per week: 0 days    Minutes  per session: 0 min  . Stress: Rather much  Relationships  . Social connections:    Talks on phone: Not on file    Gets together: Not on file    Attends religious service: 1 to 4 times per year    Active member of club or organization: No    Attends meetings of clubs or organizations: Never    Relationship status: Never married  Other Topics Concern  . Not on file  Social History Narrative  . Not on file    Allergies:  Allergies  Allergen Reactions  . Levaquin [Levofloxacin In D5w] Itching  . Penicillins  Rash    Has patient had a PCN reaction causing immediate rash, facial/tongue/throat swelling, SOB or lightheadedness with hypotension: No Has patient had a PCN reaction causing severe rash involving mucus membranes or skin necrosis: No Has patient had a PCN reaction that required hospitalization: No Has patient had a PCN reaction occurring within the last 10 years: No If all of the above answers are "NO", then may proceed with Cephalosporin use.     Metabolic Disorder Labs: No results found for: HGBA1C, MPG Lab Results  Component Value Date   PROLACTIN 6.9 01/11/2018   No results found for: CHOL, TRIG, HDL, CHOLHDL, VLDL, LDLCALC No results found for: TSH  Therapeutic Level Labs: Lab Results  Component Value Date   LITHIUM 0.55 (L) 09/01/2017   LITHIUM 0.53 (L) 06/04/2017   Lab Results  Component Value Date   VALPROATE 62 01/11/2018   No components found for:  CBMZ  Current Medications: Current Outpatient Medications  Medication Sig Dispense Refill  . gabapentin (NEURONTIN) 300 MG capsule Take 300 mg by mouth at bedtime.  4  . hydrOXYzine (VISTARIL) 25 MG capsule Take 1 capsule (25 mg total) by mouth 3 (three) times daily as needed. 90 capsule 1  . medroxyPROGESTERone (DEPO-PROVERA) 150 MG/ML injection every 3 (three) months    . divalproex (DEPAKOTE ER) 500 MG 24 hr tablet Take 2 tablets (1,000 mg total) by mouth at bedtime. 180 tablet 0  . risperiDONE (RISPERDAL) 0.5 MG tablet Take 1 tablet (0.5 mg total) by mouth daily. 90 tablet 0   No current facility-administered medications for this visit.      Musculoskeletal: Strength & Muscle Tone: within normal limits Gait & Station: normal Patient leans: N/A  Psychiatric Specialty Exam: Review of Systems  Psychiatric/Behavioral: The patient is nervous/anxious.   All other systems reviewed and are negative.   Blood pressure 138/80, pulse 97, temperature 97.8 F (36.6 C), temperature source Oral, weight 248 lb 9.6 oz  (112.8 kg).Body mass index is 42.67 kg/m.  General Appearance: Casual  Eye Contact:  Fair  Speech:  Normal Rate  Volume:  Normal  Mood:  Anxious  Affect:  Appropriate  Thought Process:  Goal Directed and Descriptions of Associations: Intact  Orientation:  Full (Time, Place, and Person)  Thought Content: Paranoid Ideation   Suicidal Thoughts:  No  Homicidal Thoughts:  No  Memory:  Immediate;   Fair Recent;   Fair Remote;   Fair  Judgement:  Fair  Insight:  Fair  Psychomotor Activity:  Normal  Concentration:  Concentration: Fair and Attention Span: Fair  Recall:  [employer] of Knowledge: Fair  Language: Fair  Akathisia:  No  Handed:  Right  AIMS (if indicated): denies tremors, rigidity,stiffness  Assets:  Communication Skills Desire for Improvement Social Support  ADL's:  Intact  Cognition: WNL  Sleep:  Fair  Screenings:   Assessment and Plan: Tericka is a 37 year old Caucasian female, unemployed, single, lives in [place] with her mother, has a history of bipolar disorder, PTSD, social anxiety disorder, presented to the clinic today for a follow-up visit.  Patient is biologically predisposed given her history of trauma, family history of mental health problems.  Patient also has psychosocial stressors of being unemployed, dealing with chronic mental health problems and so on.  Patient is tolerating her medications well however continues to have mood lability and paranoia.  Hence we will make the following medication readjustment.  Plan Bipolar disorder Discontinue lithium for lack of efficacy Increase Depakote to 1000  mg p.o. daily Depakote level-01/11/2018-62-therapeutic. Will provide lab slip to get another Depakote level in a week. Add risperidone 0.5 mg p.o. daily.  For PTSD Referred for CBT -patient declined.  For social anxiety Hydroxyzine 25 mg p.o. twice daily as needed  Follow-up in clinic in 4 weeks or sooner if needed.  More than 50 % of the time was  spent for psychoeducation and supportive psychotherapy and care coordination.  This note was generated in part or whole with voice recognition software. Voice recognition is usually quite accurate but there are transcription errors that can and very often do occur. I apologize for any typographical errors that were not detected and corrected.       Jomarie Longs, MD 01/18/2018, 9:32 AM

## 2018-01-18 NOTE — Patient Instructions (Signed)
Risperidone tablets What is this medicine? RISPERIDONE (ris PER i done) is an antipsychotic. It is used to treat schizophrenia, bipolar disorder, and some symptoms of autism. This medicine may be used for other purposes; ask your health care provider or pharmacist if you have questions. COMMON BRAND NAME(S): Risperdal What should I tell my health care provider before I take this medicine? They need to know if you have any of these conditions: -blood disorder or disease -dementia -diabetes or a family history of diabetes -difficulty swallowing -heart disease or previous heart attack -history of brain tumor or head injury -history of breast cancer -irregular heartbeat or low blood pressure -kidney or liver disease -Parkinson's disease -seizures (convulsions) -an unusual or allergic reaction to risperidone, paliperidone, other medicines, foods, dyes, or preservatives -pregnant or trying to get pregnant -breast-feeding How should I use this medicine? Take this medicine by mouth with a glass of water. Follow the directions on the prescription label. You can take it with or without food. If it upsets your stomach, take it with food. Take your medicine at regular intervals. Do not take it more often than directed. Do not stop taking except on your doctor's advice. Talk to your pediatrician regarding the use of this medicine in children. While this drug may be prescribed for children as young as 5 years of age for selected conditions, precautions do apply. Overdosage: If you think you have taken too much of this medicine contact a poison control center or emergency room at once. NOTE: This medicine is only for you. Do not share this medicine with others. What if I miss a dose? If you miss a dose, take it as soon as you can. If it is almost time for your next dose, take only that dose. Do not take double or extra doses. What may interact with this medicine? Do not take this medicine with any of  the following medications: -certain medicines for fungal infections like fluconazole, itraconazole, ketoconazole, posaconazole, voriconazole -cisapride -dofetilide -dronedarone -droperidol -pimozide -sparfloxacin -thioridazine This medicine may also interact with the following medications: -arsenic trioxide -certain antibiotics like clarithromycin, gatifloxacin, levofloxacin, moxifloxacin, pentamidine, rifampin -certain medicines for blood pressure -certain medicines for cancer -certain medicines for irregular heart beat -certain medications for Parkinson's disease like levodopa -certain medicines for seizures like carbamazepine -certain medicines for sleep or sedation -narcotic medicines for pain -other medicines for mental anxiety, depression, or psychotic disturbances -other medicines that prolong the QT interval (cause an abnormal heart rhythm) -ritonavir This list may not describe all possible interactions. Give your health care provider a list of all the medicines, herbs, non-prescription drugs, or dietary supplements you use. Also tell them if you smoke, drink alcohol, or use illegal drugs. Some items may interact with your medicine. What should I watch for while using this medicine? Visit your doctor or health care professional for regular checks on your progress. It may be several weeks before you see the full effects. Do not suddenly stop taking this medicine. You may need to gradually reduce the dose. Only stop taking this medicine on the advice of your doctor or health care professional. You may get dizzy or drowsy. Do not drive, use machinery, or do anything that needs mental alertness until you know how this medicine affects you. Do not stand or sit up quickly, especially if you are an older patient. This reduces the risk of dizzy or fainting spells. Alcohol can increase dizziness and drowsiness. Avoid alcoholic drinks. You can get a hangover effect the   morning after a bedtime  dose. Do not treat yourself for colds, diarrhea or allergies. Ask your doctor or health care professional for advice, some nonprescription medicines may increase possible side effects. This medicine can reduce the response of your body to heat or cold. Dress warm in cold weather and stay hydrated in hot weather. If possible, avoid extreme temperatures like saunas, hot tubs, very hot or cold showers, or activities that can cause dehydration such as vigorous exercise. What side effects may I notice from receiving this medicine? Side effects that you should report to your doctor or health care professional as soon as possible: -aching muscles and joints -confusion -fainting spells -fast or irregular heartbeat -fever or chills, sore throat -increased hunger or thirst -increased urination -loss of balance, difficulty walking or falls -stiffness, spasms, trembling -uncontrollable head, mouth, neck, arm, or leg movements -unusually weak or tired Side effects that usually do not require medical attention (report to your doctor or health care professional if they continue or are bothersome): -constipation -decreased sexual ability -difficulty sleeping -drowsiness or dizziness -increase or decrease in saliva -nausea, vomiting -weight gain This list may not describe all possible side effects. Call your doctor for medical advice about side effects. You may report side effects to FDA at 1-800-FDA-1088. Where should I keep my medicine? Keep out of the reach of children. Store at room temperature between 15 and 25 degrees C (59 and 77 degrees F). Protect from light. Throw away any unused medicine after the expiration date. NOTE: This sheet is a summary. It may not cover all possible information. If you have questions about this medicine, talk to your doctor, pharmacist, or health care provider.  2018 [employer]vier/[employer] Standard (2015-09-10 18:50:25)  

## 2018-01-23 ENCOUNTER — Encounter: Payer: Self-pay | Admitting: Emergency Medicine

## 2018-01-23 ENCOUNTER — Other Ambulatory Visit: Payer: Self-pay

## 2018-01-23 ENCOUNTER — Emergency Department
Admission: EM | Admit: 2018-01-23 | Discharge: 2018-01-23 | Disposition: A | Discharge status: Home / Self Care | Payer: BLUE CROSS/BLUE SHIELD | Attending: Emergency Medicine | Admitting: Emergency Medicine

## 2018-01-23 DIAGNOSIS — F329 Major depressive disorder, single episode, unspecified: Secondary | ICD-10-CM | POA: Diagnosis not present

## 2018-01-23 DIAGNOSIS — R45851 Suicidal ideations: Secondary | ICD-10-CM

## 2018-01-23 DIAGNOSIS — Z046 Encounter for general psychiatric examination, requested by authority: Secondary | ICD-10-CM | POA: Diagnosis present

## 2018-01-23 DIAGNOSIS — F319 Bipolar disorder, unspecified: Secondary | ICD-10-CM | POA: Diagnosis not present

## 2018-01-23 DIAGNOSIS — F419 Anxiety disorder, unspecified: Secondary | ICD-10-CM | POA: Insufficient documentation

## 2018-01-23 LAB — CBC
HCT: 42 % (ref 36.0–46.0)
Hemoglobin: 14.1 g/dL (ref 12.0–15.0)
MCH: 31.8 pg (ref 26.0–34.0)
MCHC: 33.6 g/dL (ref 30.0–36.0)
MCV: 94.8 fL (ref 80.0–100.0)
PLATELETS: 225 10*3/uL (ref 150–400)
RBC: 4.43 MIL/uL (ref 3.87–5.11)
RDW: 12.2 % (ref 11.5–15.5)
WBC: 8.8 10*3/uL (ref 4.0–10.5)
nRBC: 0 % (ref 0.0–0.2)

## 2018-01-23 LAB — SALICYLATE LEVEL

## 2018-01-23 LAB — COMPREHENSIVE METABOLIC PANEL
ALT: 32 U/L (ref 0–44)
ANION GAP: 9 (ref 5–15)
AST: 23 U/L (ref 15–41)
Albumin: 4 g/dL (ref 3.5–5.0)
Alkaline Phosphatase: 76 U/L (ref 38–126)
BILIRUBIN TOTAL: 0.4 mg/dL (ref 0.3–1.2)
BUN: 10 mg/dL (ref 6–20)
CHLORIDE: 103 mmol/L (ref 98–111)
CO2: 23 mmol/L (ref 22–32)
Calcium: 9.1 mg/dL (ref 8.9–10.3)
Creatinine, Ser: 0.74 mg/dL (ref 0.44–1.00)
GFR calc Af Amer: >60 mL/min (ref >60)
Glucose, Bld: 117 mg/dL — ABNORMAL HIGH (ref 70–99)
POTASSIUM: 3.1 mmol/L — AB (ref 3.5–5.1)
Sodium: 135 mmol/L (ref 135–145)
Total Protein: 7.3 g/dL (ref 6.5–8.1)

## 2018-01-23 LAB — URINE DRUG SCREEN, QUALITATIVE (ARMC ONLY)
Amphetamines, Ur Screen: NOT DETECTED
BARBITURATES, UR SCREEN: NOT DETECTED
BENZODIAZEPINE, UR SCRN: NOT DETECTED
Cannabinoid 50 Ng, Ur ~~LOC~~: NOT DETECTED
Cocaine Metabolite,Ur ~~LOC~~: NOT DETECTED
MDMA (Ecstasy)Ur Screen: NOT DETECTED
METHADONE SCREEN, URINE: NOT DETECTED
OPIATE, UR SCREEN: NOT DETECTED
Phencyclidine (PCP) Ur S: NOT DETECTED
TRICYCLIC, UR SCREEN: NOT DETECTED

## 2018-01-23 LAB — HCG, QUANTITATIVE, PREGNANCY

## 2018-01-23 LAB — ETHANOL

## 2018-01-23 LAB — ACETAMINOPHEN LEVEL

## 2018-01-23 MED ORDER — OLANZAPINE 5 MG PO TBDP: ORAL_TABLET | ORAL | 1 refills | Status: DC

## 2018-01-23 MED ORDER — OLANZAPINE 5 MG PO TBDP
2.5000 mg | ORAL_TABLET | Freq: Once | ORAL | Status: AC
  Administered 2018-01-23: 2.5 mg via ORAL
  Filled 2018-01-23: qty 1

## 2018-01-23 NOTE — ED Notes (Signed)
Report given to SOC 

## 2018-01-23 NOTE — ED Notes (Signed)
TTS at bedside. 

## 2018-01-23 NOTE — ED Notes (Signed)
Pt given diner tray.
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
## 2018-01-23 NOTE — ED Provider Notes (Signed)
Patient was evaluated by tele-psychiatry, adjustments to her medication have been made.  She is cleared for outpatient follow-up.   Emily FilbertWilliams, Jonathan E, MD 01/23/18 [phone]

## 2018-01-23 NOTE — ED Provider Notes (Signed)
[facility] Emergency Department Provider Note       Time seen: ----------------------------------------- 3:23 PM on 01/23/2018 -----------------------------------------   I have reviewed the triage vital signs and the nursing notes.  HISTORY   Chief Complaint Depression    HPI Pamela Rasmussen is a 37 y.o. female with a history of anxiety, bipolar disorder who presents to the ED for suicidal ideation.  Patient reports being stressed out lately with thoughts of harming herself.  She has not made any specific plan, wants tried to hurt herself in the remote past.  She denies any recent illness, recently had her medications adjusted without any improvement.  Past Medical History:  Diagnosis Date  . Anxiety   . Bipolar 1 disorder (HCC)   . Vertigo     Patient Active Problem List   Diagnosis Date Noted  . Bipolar depression (HCC) 05/05/2017  . BMI 45.0-49.9, adult (HCC) 05/05/2017    Past Surgical History:  Procedure Laterality Date  . CHOLECYSTECTOMY      Allergies Levaquin [levofloxacin in d5w] and Penicillins  Social History Social History   Tobacco Use  . Smoking status: Never Smoker  . Smokeless tobacco: Never Used  Substance Use Topics  . Alcohol use: No  . Drug use: No   Review of Systems Constitutional: Negative for fever. Cardiovascular: Negative for chest pain. Respiratory: Negative for shortness of breath. Gastrointestinal: Negative for abdominal pain, vomiting and diarrhea. Musculoskeletal: Negative for back pain. Skin: Negative for rash. Neurological: Negative for headaches, focal weakness or numbness. Psychiatric: Positive for depression, suicidal thoughts  All systems negative/normal/unremarkable except as stated in the HPI  ____________________________________________   PHYSICAL EXAM:  VITAL SIGNS: ED Triage Vitals  Enc Vitals Group     BP 01/23/18 1405 134/76     Pulse Rate 01/23/18 1405 98     Resp  01/23/18 1405 20     Temp 01/23/18 1405 98.7 F (37.1 C)     Temp Source 01/23/18 1405 Oral     SpO2 01/23/18 1405 99 %     Weight 01/23/18 1407 248 lb (112.5 kg)     Height 01/23/18 1407 5\' 4"  (1.626 m)     Head Circumference --      Peak Flow --      Pain Score 01/23/18 1406 0     Pain Loc --      Pain Edu? --      Excl. in GC? --    Constitutional: Alert and oriented. Well appearing and in no distress. ENT   Head: Normocephalic and atraumatic.   Nose: No congestion/rhinnorhea.   Mouth/Throat: Mucous membranes are moist.   Neck: No stridor. Cardiovascular: Normal rate, regular rhythm. No murmurs, rubs, or gallops. Respiratory: Normal respiratory effort without tachypnea nor retractions. Breath sounds are clear and equal bilaterally. No wheezes/rales/rhonchi. Gastrointestinal: Soft and nontender. Normal bowel sounds Musculoskeletal: Nontender with normal range of motion in extremities. No lower extremity tenderness nor edema. Neurologic:  Normal speech and language. No gross focal neurologic deficits are appreciated.  Skin:  Skin is warm, dry and intact. No rash noted. Psychiatric: Mood and affect are normal. Speech and behavior are normal.  ____________________________________________  ED COURSE:  As part of my medical decision making, I reviewed the following data within the electronic MEDICAL RECORD NUMBER History obtained from family if available, nursing notes, old chart and ekg, as well as notes from prior ED visits. Patient presented for suicidal thoughts, we will assess with labs and imaging  as indicated at this time.   Procedures ____________________________________________   LABS (pertinent positives/negatives)  Labs Reviewed  COMPREHENSIVE METABOLIC PANEL - Abnormal; Notable for the following components:      Result Value   Potassium 3.1 (*)    Glucose, Bld 117 (*)    All other components within normal limits  ACETAMINOPHEN LEVEL - Abnormal; Notable for  the following components:   Acetaminophen (Tylenol), Serum <10 (*)    All other components within normal limits  ETHANOL  SALICYLATE LEVEL  CBC  URINE DRUG SCREEN, QUALITATIVE ([facility] ONLY)  HCG, QUANTITATIVE, PREGNANCY  ____________________________________________  DIFFERENTIAL DIAGNOSIS   Suicidal ideation, depression, bipolar disorder  FINAL ASSESSMENT AND PLAN  Bipolar disorder, suicidal ideation   Plan: The patient had presented for suicidal thought. Patient's labs are unremarkable.  She appears medically stable for psychiatric evaluation and disposition.   Ulice DashJohnathan E Tuff Clabo, MD   Note: This note was generated in part or whole with voice recognition software. Voice recognition is usually quite accurate but there are transcription errors that can and very often do occur. I apologize for any typographical errors that were not detected and corrected.     Emily FilbertWilliams, Vernadine Coombs E, MD 01/23/18 [phone]

## 2018-01-23 NOTE — ED Notes (Signed)
Pt cam and cooperative.  Pt endorses suicidal thoughts, but no intent. Pt states she "got into it" with her mom and her mom called the police (verbal disagreement). Patient came to the hospital voluntarily for help. Pt reports she is bipolar and regularly  takes lithium and Depakote.   Pt states she is having "outbursts"  (agitated).  Denies pain and AVH.  Maintained on 15 minute checks and observation by security camera for safety.

## 2018-01-23 NOTE — ED Triage Notes (Signed)
States has been stressed lately thoughts of harming self without intention. Wishes to be evaluated.

## 2018-01-26 ENCOUNTER — Encounter: Payer: Self-pay | Admitting: Psychiatry

## 2018-01-26 ENCOUNTER — Other Ambulatory Visit: Payer: Self-pay

## 2018-01-26 ENCOUNTER — Ambulatory Visit (INDEPENDENT_AMBULATORY_CARE_PROVIDER_SITE_OTHER): Payer: BLUE CROSS/BLUE SHIELD | Admitting: Psychiatry

## 2018-01-26 VITALS — BP 137/83 | HR 89 | Temp 97.9°F | Ht 64.0 in | Wt 248.8 lb

## 2018-01-26 DIAGNOSIS — F4312 Post-traumatic stress disorder, chronic: Secondary | ICD-10-CM

## 2018-01-26 DIAGNOSIS — F3162 Bipolar disorder, current episode mixed, moderate: Secondary | ICD-10-CM | POA: Diagnosis not present

## 2018-01-26 DIAGNOSIS — F4010 Social phobia, unspecified: Secondary | ICD-10-CM

## 2018-01-26 MED ORDER — LITHIUM CARBONATE 300 MG PO TABS: 300.0000 mg | ORAL_TABLET | Freq: Every day | ORAL | 1 refills | Status: DC

## 2018-01-26 NOTE — Patient Instructions (Signed)
Olanzapine disintegrating tablets What is this medicine? OLANZAPINE (oh LAN za peen) is used to treat schizophrenia, psychotic disorders, and bipolar disorder. Bipolar disorder is also known as manic-depression. This medicine may be used for other purposes; ask your health care provider or pharmacist if you have questions. COMMON BRAND NAME(S): Zyprexa Zydis What should I tell my health care provider before I take this medicine? They need to know if you have any of these conditions: -breast cancer or history or breast cancer -cigarette smoker -dementia -diabetes mellitus, high blood sugar or a family history of diabetes -difficulty swallowing -glaucoma -heart disease, irregular heartbeat, or previous heart attack -history of brain tumor or head injury -kidney or liver disease -low blood pressure or dizziness when standing up -Parkinson's disease -prostate trouble -seizures (convulsions) -suicidal thoughts, plans, or attempt by you or a family member -an unusual or allergic reaction to olanzapine, other medicines, foods, dyes, or preservatives -pregnant or trying to get pregnant -breast-feeding How should I use this medicine? Take this medicine by mouth. Follow the directions on the prescription label. Leave the tablet in the foil package until you are ready to take it. Do not push the tablet through the blister pack. Peel open the package with dry hands and place the tablet on your tongue. The tablet will dissolve rapidly and be swallowed in your saliva. While you may take these tablets with food or water, it is not necessary to do so. Take your medicine at regular intervals. Do not take it more often than directed. Do not stop taking except on the advice of your doctor or health care professional. A special MedGuide will be given to you by the pharmacist with each new prescription and refill. Be sure to read this information carefully each time. Talk to your pediatrician regarding the use  of this medicine in children. While this drug may be prescribed for children as young as 13 years for selected conditions, precautions do apply. Overdosage: If you think you have taken too much of this medicine contact a poison control center or emergency room at once. NOTE: This medicine is only for you. Do not share this medicine with others. What if I miss a dose? If you miss a dose, take it as soon as you can. If it is almost time for your next dose, take only that dose. Do not take double or extra doses. What may interact with this medicine? Do not take this medicine with any of the following medications: -certain antibiotics like grepafloxacin and sparfloxacin -certain phenothiazines like chlorpromazine, mesoridazine, and thioridazine -cisapride -clozapine -droperidol -halofantrine -levomethadyl -pimozide This medicine may also interact with the following medications: -carbamazepine -charcoal -fluvoxamine -levodopa and other medicines for Parkinson's disease -medicines for diabetes -medicines for high blood pressure -medicines for mental depression, anxiety, other mood disorders, or sleeping problems -omeprazole -rifampin -ritonavir -tobacco from cigarettes This list may not describe all possible interactions. Give your health care provider a list of all the medicines, herbs, non-prescription drugs, or dietary supplements you use. Also tell them if you smoke, drink alcohol, or use illegal drugs. Some items may interact with your medicine. What should I watch for while using this medicine? Visit your doctor or health care professional for regular checks on your progress. It may be several weeks before you see the full effects of this medicine. Notify your doctor or health care professional if your symptoms get worse, if you have new symptoms, if you are having an unusual effect from this medicine, or if   you feel out of control, very discouraged or think you might harm yourself or  others. Do not suddenly stop taking this medicine. You may need to gradually reduce the dose. Ask your doctor or health care professional for advice. You may get dizzy or drowsy. Do not drive, use machinery, or do anything that needs mental alertness until you know how this medicine affects you. Do not stand or sit up quickly, especially if you are an older patient. This reduces the risk of dizzy or fainting spells. Avoid alcoholic drinks. Alcohol can increase dizziness and drowsiness with olanzapine. Do not treat yourself for colds, diarrhea or allergies without asking your doctor or health care professional for advice. Some ingredients can increase possible side effects. Your mouth may get dry. Chewing sugarless gum or sucking hard candy, and drinking plenty of water will help. This medicine can reduce the response of your body to heat or cold. Dress warm in cold weather and stay hydrated in hot weather. If possible, avoid extreme temperatures like saunas, hot tubs, very hot or cold showers, or activities that can cause dehydration such as vigorous exercise. If you notice an increased hunger or thirst, different from your normal hunger or thirst, or if you find that you have to urinate more frequently, you should contact your health care provider as soon as possible. You may need to have your blood sugar monitored. This medicine may cause changes in your blood sugar levels. You should monitor you blood sugar frequently if you have diabetes. If you smoke, tell your doctor if you notice this medicine is not working well for you. Talk to your doctor if you are a smoker or if you decide to stop smoking. What side effects may I notice from receiving this medicine? Side effects that you should report to your doctor or health care professional as soon as possible: -allergic reactions like skin rash, itching or hives, swelling of the face, lips, or tongue -breathing problems -difficulty in speaking or  swallowing -excessive thirst and/or hunger -fast heartbeat (palpitations) -fever or chills, sore throat -fever with rash, swollen lymph nodes, or swelling of the face -frequently needing to urinate -inability to control muscle movements in the face, hands, arms, or legs -painful or prolonged erections -redness, blistering, peeling or loosening of the skin, including inside the mouth -restlessness or need to keep moving -seizures (convulsions) -stiffness, spasms -tremors or trembling Side effects that usually do not require medical attention (report to your doctor or health care professional if they continue or are bothersome): -changes in sexual desire -constipation -drowsiness -lowered blood pressure -weight gain This list may not describe all possible side effects. Call your doctor for medical advice about side effects. You may report side effects to FDA at 1-800-FDA-1088. Where should I keep my medicine? Keep out of the reach of children. Store at controlled room temperature between 15 and 30 degrees C (59 and 86 degrees F). Protect from light and moisture. Throw away any unused medicine after the expiration date. NOTE: This sheet is a summary. It may not cover all possible information. If you have questions about this medicine, talk to your doctor, pharmacist, or health care provider.  2019 Elsevier/Gold Standard (2014-06-14 20:36:21)

## 2018-01-26 NOTE — Progress Notes (Signed)
BH MD OP Progress Note  01/26/2018 11:40 AM Pamela Rasmussen  MRN:  [id number]  Chief Complaint: ' I am here for follow up.' Chief Complaint    Follow-up; Medication Problem; Medication Refill     HPI: Pamela Rasmussen is a 37 year old Caucasian female, unemployed, single, lives in [place] with her mother, has a history of bipolar disorder, PTSD, social anxiety disorder, presented to the clinic today for a follow-up visit.  Patient today reports she was recently seen in the emergency department for suicidal ideation.  Patient reports she was offered a job by a temp agency and she was supposed to report to work on 20 th  December.  She however reports that the morning of December 20 her temp agent called her and told her not to come in that day and to be there only on Saturday.  Patient had arranged transportation for herself to go to work that day.  When she heard about the change of plan she called her friend and told her she did not want the car that day.  She reports the temp agency called her back in an hour and told her that plans changed again and that she needed to be at work by 11 AM.  Patient reports she did not have transportation to get there and this made her agitated.  She had mood lability the whole day and was destructive.  She reports she destroyed some property at her home because she was irritable.  Patient also started having these suicidal thoughts which she reports she chronically has.  Patient however reports she did not have a plan and even though she struggles with chronic suicidality she will never act on it.  Patient however reports her mother got upset and called police on her.  And they took her to the emergency department to be evaluated.  Patient had medication changes at the emergency department and was released.  Patient had just started taking Depakote few days prior.  However in the emergency department all her medications were discontinued and she was started on  Zyprexa.  Patient previously was on lithium.  Discussed with patient that since she had not given the new medication-Depakote enough time it would be fair to give it a retrial.  Patient did not have any side effects to the Depakote.  Also discussed with patient that lithium can help with chronic suicidal thoughts.  Patient agrees to retry both Depakote and lithium which she was previously on.  Patient reports sleep is better on the Zyprexa.  Patient currently denies any suicidality or perceptual disturbances.  Discussed with patient the need for psychotherapy and she agrees today.  For refer her for the same   Visit Diagnosis:    ICD-10-CM   1. Bipolar 1 disorder, mixed, moderate (HCC) F31.62   2. Chronic post-traumatic stress disorder (PTSD) F43.12   3. Social anxiety disorder F40.10     Past Psychiatric History: Have reviewed past psychiatric history from my progress note on 01/04/2018-past trials of Lexapro, Celexa, olanzapine, lithium, Wellbutrin, Depakote, Risperdal  Past Medical History:  Past Medical History:  Diagnosis Date  . Anxiety   . Bipolar 1 disorder (HCC)   . Vertigo     Past Surgical History:  Procedure Laterality Date  . CHOLECYSTECTOMY      Family Psychiatric History: I have reviewed family psychiatric history from my progress note on 01/04/2018  Family History:  Family History  Problem Relation Age of Onset  . Bipolar disorder Mother   .  Schizophrenia Maternal Aunt   . Bipolar disorder Paternal Grandmother   . Schizophrenia Paternal Grandmother     Social History: Reviewed social history from my progress note on 01/04/2018 Social History   Socioeconomic History  . Marital status: Significant Other    Spouse name: Not on file  . Number of children: 9  . Years of education: Not on file  . Highest education level: Associate degree: occupational, Scientist, product/process developmenttechnical, or vocational program  Occupational History    Comment: not employed  Social Needs  .  Financial resource strain: Not hard at all  . Food insecurity:    Worry: Never true    Inability: Never true  . Transportation needs:    Medical: Yes    Non-medical: Yes  Tobacco Use  . Smoking status: Never Smoker  . Smokeless tobacco: Never Used  Substance and Sexual Activity  . Alcohol use: No  . Drug use: No  . Sexual activity: Yes  Lifestyle  . Physical activity:    Days per week: 0 days    Minutes per session: 0 min  . Stress: Rather much  Relationships  . Social connections:    Talks on phone: Not on file    Gets together: Not on file    Attends religious service: 1 to 4 times per year    Active member of club or organization: No    Attends meetings of clubs or organizations: Never    Relationship status: Never married  Other Topics Concern  . Not on file  Social History Narrative  . Not on file    Allergies:  Allergies  Allergen Reactions  . Levaquin [Levofloxacin In D5w] Itching  . Penicillins Rash    Has patient had a PCN reaction causing immediate rash, facial/tongue/throat swelling, SOB or lightheadedness with hypotension: No Has patient had a PCN reaction causing severe rash involving mucus membranes or skin necrosis: No Has patient had a PCN reaction that required hospitalization: No Has patient had a PCN reaction occurring within the last 10 years: No If all of the above answers are "NO", then may proceed with Cephalosporin use.     Metabolic Disorder Labs: No results found for: HGBA1C, MPG Lab Results  Component Value Date   PROLACTIN 6.9 01/11/2018   No results found for: CHOL, TRIG, HDL, CHOLHDL, VLDL, LDLCALC No results found for: TSH  Therapeutic Level Labs: Lab Results  Component Value Date   LITHIUM 0.55 (L) 09/01/2017   LITHIUM 0.53 (L) 06/04/2017   Lab Results  Component Value Date   VALPROATE 62 01/11/2018   No components found for:  CBMZ  Current Medications: Current Outpatient Medications  Medication Sig Dispense Refill   . divalproex (DEPAKOTE ER) 500 MG 24 hr tablet Take 2 tablets (1,000 mg total) by mouth at bedtime. 180 tablet 0  . hydrOXYzine (VISTARIL) 25 MG capsule Take 1 capsule (25 mg total) by mouth 3 (three) times daily as needed. 90 capsule 1  . medroxyPROGESTERone (DEPO-PROVERA) 150 MG/ML injection every 3 (three) months    . OLANZapine zydis (ZYPREXA ZYDIS) 5 MG disintegrating tablet Take 1/2 tablet by mouth at 8 PM for mood stabilization 30 tablet 1  . lithium 300 MG tablet Take 1 tablet (300 mg total) by mouth daily with breakfast. 90 tablet 1   No current facility-administered medications for this visit.      Musculoskeletal: Strength & Muscle Tone: within normal limits Gait & Station: normal Patient leans: N/A  Psychiatric Specialty Exam: Review of Systems  Psychiatric/Behavioral: Positive for depression. The patient is nervous/anxious.   All other systems reviewed and are negative.   Blood pressure 137/83, pulse 89, temperature 97.9 F (36.6 C), temperature source Oral, height 5\' 4"  (1.626 m), weight 248 lb 12.8 oz (112.9 kg), last menstrual period 01/09/2018.Body mass index is 42.71 kg/m.  General Appearance: Casual  Eye Contact:  Fair  Speech:  Clear and Coherent  Volume:  Normal  Mood:  Anxious and Dysphoric  Affect:  Congruent  Thought Process:  Goal Directed and Descriptions of Associations: Intact  Orientation:  Full (Time, Place, and Person)  Thought Content: Logical   Suicidal Thoughts:  No  Homicidal Thoughts:  No  Memory:  Immediate;   Fair Recent;   Fair Remote;   Fair  Judgement:  Fair  Insight:  Fair  Psychomotor Activity:  Normal  Concentration:  Concentration: Fair and Attention Span: Fair  Recall:  [employer] of Knowledge: Fair  Language: Fair  Akathisia:  No  Handed:  Right  AIMS (if indicated):denies tremors, rigidity,stiffness  Assets:  Communication Skills Desire for Improvement Social Support  ADL's:  Intact  Cognition: WNL  Sleep:  Fair    Screenings:   Assessment and Plan: Hiilei is a 37 year old Caucasian female, unemployed, single, lives in [place] with her mother, has a history of bipolar disorder, PTSD, social anxiety disorder, presented to clinic today for a follow-up visit.  Patient is biologically pressed predisposed given her history of trauma, family history of mental health problems.  She also has psychosocial stressors of being unemployed, dealing with chronic mental health problems.  She recently had emergency department visits and had medication changes.  We will continue to make medication changes for mood lability.  Plan as noted below.  Plan For bipolar disorder Restart lithium 300 mg p.o. daily with breakfast Restart Depakote 1000 mg p.o. daily. Will provide lab slip to get Depakote level done. Also will provide lab slip to get lithium level done in a week. Continue Zyprexa 5 mg po qhs.  For PTSD Referred for CBT  For social anxiety Hydroxyzine 25 mg p.o. twice daily as needed  Follow-up in clinic in 2 weeks or sooner if needed.  More than 50 % of the time was spent for psychoeducation and supportive psychotherapy and care coordination.  This note was generated in part or whole with voice recognition software. Voice recognition is usually quite accurate but there are transcription errors that can and very often do occur. I apologize for any typographical errors that were not detected and corrected.       Jomarie Longs, MD 01/26/2018, 11:40 AM

## 2018-02-01 ENCOUNTER — Other Ambulatory Visit: Payer: Self-pay | Admitting: Psychiatry

## 2018-02-02 ENCOUNTER — Telehealth: Payer: Self-pay | Admitting: Psychiatry

## 2018-02-02 LAB — VALPROIC ACID LEVEL: Valproic Acid Lvl: 58 ug/mL (ref 50–100)

## 2018-02-02 LAB — LITHIUM LEVEL: LITHIUM LVL: 0.1 mmol/L — AB (ref 0.6–1.2)

## 2018-02-02 NOTE — Telephone Encounter (Signed)
Lithium level - subtherapeutic  - 0.1  depakote - therapeutic - 58  She has upcoming appointment and will discuss at visit.

## 2018-02-04 ENCOUNTER — Ambulatory Visit: Payer: BLUE CROSS/BLUE SHIELD | Admitting: Psychiatry

## 2018-02-15 ENCOUNTER — Ambulatory Visit: Payer: BLUE CROSS/BLUE SHIELD | Admitting: Psychiatry

## 2018-02-18 ENCOUNTER — Ambulatory Visit: Payer: BLUE CROSS/BLUE SHIELD | Admitting: Licensed Clinical Social Worker

## 2018-03-16 ENCOUNTER — Ambulatory Visit: Payer: BLUE CROSS/BLUE SHIELD | Admitting: Licensed Clinical Social Worker

## 2018-03-18 ENCOUNTER — Encounter: Payer: Self-pay | Admitting: Psychiatry

## 2018-03-18 ENCOUNTER — Other Ambulatory Visit: Payer: Self-pay

## 2018-03-18 ENCOUNTER — Ambulatory Visit (INDEPENDENT_AMBULATORY_CARE_PROVIDER_SITE_OTHER): Payer: BLUE CROSS/BLUE SHIELD | Admitting: Psychiatry

## 2018-03-18 VITALS — BP 119/82 | HR 82 | Temp 99.2°F | Wt 261.0 lb

## 2018-03-18 DIAGNOSIS — F3162 Bipolar disorder, current episode mixed, moderate: Secondary | ICD-10-CM

## 2018-03-18 DIAGNOSIS — F4010 Social phobia, unspecified: Secondary | ICD-10-CM

## 2018-03-18 DIAGNOSIS — F4312 Post-traumatic stress disorder, chronic: Secondary | ICD-10-CM | POA: Diagnosis not present

## 2018-03-18 MED ORDER — OLANZAPINE 10 MG PO TBDP: 10.0000 mg | ORAL_TABLET | Freq: Every day | ORAL | 0 refills | Status: DC

## 2018-03-18 NOTE — Progress Notes (Signed)
BH MD OP Progress Note  03/18/2018 5:39 PM Pamela Rasmussen  MRN:  [id number]  Chief Complaint: ' I am here for follow up.' Chief Complaint    Follow-up; Medication Refill     HPI: Pamela Rasmussen is a 38 year old Caucasian female, single, lives in [place] with her mother, has a history of bipolar disorder, PTSD, social anxiety disorder, presented to clinic today for a follow-up visit.  Patient today reports she is compliant on her medications.  She reports her mood symptoms have improved.  She however reports there are times when she struggles with some mood swings however overall she is making progress.  She reports she is compliant with her Zyprexa which also helps with the sleep.  She however is interested in a dosage increase today.  She denies any tremors, stiffness or rigidity.  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  She will start Monday.  She is excited about it.  Patient reports she missed her therapy appointment this week since the therapist was out.  Patient reports she will reschedule her appointment today.  Patient denies any suicidality, homicidality or perceptual disturbances.  Discussed lithium level and Depakote level with patient.  She currently reports she wants to stay on the same dosage and does not want more dose change.      Visit Diagnosis:    ICD-10-CM   1. Bipolar 1 disorder, mixed, moderate (HCC) F31.62 OLANZapine zydis (ZYPREXA ZYDIS) 10 MG disintegrating tablet  2. Chronic post-traumatic stress disorder (PTSD) F43.12   3. Social anxiety disorder F40.10     Past Psychiatric History: Reviewed past psychiatric history from my progress note on 01/04/2018-past trials of Celexa Lexapro, olanzapine, lithium, Wellbutrin, Depakote, Risperdal.     Past Medical History:  Past Medical History:  Diagnosis Date  . Anxiety   . Bipolar 1 disorder (HCC)   . Vertigo     Past Surgical History:  Procedure Laterality Date  .  CHOLECYSTECTOMY      Family Psychiatric History: Reviewed family psychiatric history from my progress note on 01/04/2018.  Family History:  Family History  Problem Relation Age of Onset  . Bipolar disorder Mother   . Schizophrenia Maternal Aunt   . Bipolar disorder Paternal Grandmother   . Schizophrenia Paternal Grandmother     Social History: I have reviewed social history from my progress note on 01/04/2018. Social History   Socioeconomic History  . Marital status: Significant Other    Spouse name: Not on file  . Number of children: 9  . Years of education: Not on file  . Highest education level: Associate degree: occupational, Scientist, product/process developmenttechnical, or vocational program  Occupational History    Comment: not employed  Social Needs  . Financial resource strain: Not hard at all  . Food insecurity:    Worry: Never true    Inability: Never true  . Transportation needs:    Medical: Yes    Non-medical: Yes  Tobacco Use  . Smoking status: Never Smoker  . Smokeless tobacco: Never Used  Substance and Sexual Activity  . Alcohol use: No  . Drug use: No  . Sexual activity: Yes  Lifestyle  . Physical activity:    Days per week: 0 days    Minutes per session: 0 min  . Stress: Rather much  Relationships  . Social connections:    Talks on phone: Not on file    Gets together: Not on file    Attends religious service: 1  to 4 times per year    Active member of club or organization: No    Attends meetings of clubs or organizations: Never    Relationship status: Never married  Other Topics Concern  . Not on file  Social History Narrative  . Not on file    Allergies:  Allergies  Allergen Reactions  . Levaquin [Levofloxacin In D5w] Itching  . Penicillins Rash    Has patient had a PCN reaction causing immediate rash, facial/tongue/throat swelling, SOB or lightheadedness with hypotension: No Has patient had a PCN reaction causing severe rash involving mucus membranes or skin necrosis:  No Has patient had a PCN reaction that required hospitalization: No Has patient had a PCN reaction occurring within the last 10 years: No If all of the above answers are "NO", then may proceed with Cephalosporin use.     Metabolic Disorder Labs: No results found for: HGBA1C, MPG Lab Results  Component Value Date   PROLACTIN 6.9 01/11/2018   No results found for: CHOL, TRIG, HDL, CHOLHDL, VLDL, LDLCALC No results found for: TSH  Therapeutic Level Labs: Lab Results  Component Value Date   LITHIUM 0.1 (L) 02/01/2018   LITHIUM 0.55 (L) 09/01/2017   Lab Results  Component Value Date   VALPROATE 58 02/01/2018   VALPROATE 62 01/11/2018   No components found for:  CBMZ  Current Medications: Current Outpatient Medications  Medication Sig Dispense Refill  . divalproex (DEPAKOTE ER) 500 MG 24 hr tablet Take 2 tablets (1,000 mg total) by mouth at bedtime. 180 tablet 0  . hydrOXYzine (VISTARIL) 25 MG capsule Take 1 capsule (25 mg total) by mouth 3 (three) times daily as needed. 90 capsule 1  . lithium 300 MG tablet Take 1 tablet (300 mg total) by mouth daily with breakfast. 90 tablet 1  . medroxyPROGESTERone (DEPO-PROVERA) 150 MG/ML injection every 3 (three) months    . OLANZapine zydis (ZYPREXA ZYDIS) 10 MG disintegrating tablet Take 1 tablet (10 mg total) by mouth at bedtime. Mood 90 tablet 0   No current facility-administered medications for this visit.      Musculoskeletal: Strength & Muscle Tone: within normal limits Gait & Station: normal Patient leans: N/A  Psychiatric Specialty Exam: Review of Systems  Psychiatric/Behavioral: Positive for depression. The patient is nervous/anxious.   All other systems reviewed and are negative.   Blood pressure 119/82, pulse 82, temperature 99.2 F (37.3 C), temperature source Oral, weight 261 lb (118.4 kg).Body mass index is 44.8 kg/m.  General Appearance: Casual  Eye Contact:  Fair  Speech:  Normal Rate  Volume:  Normal  Mood:   Anxious and Dysphoric  Affect:  Congruent  Thought Process:  Goal Directed and Descriptions of Associations: Intact  Orientation:  Full (Time, Place, and Person)  Thought Content: Logical   Suicidal Thoughts:  No  Homicidal Thoughts:  No  Memory:  Immediate;   Fair Recent;   Fair Remote;   Fair  Judgement:  Fair  Insight:  Fair  Psychomotor Activity:  Normal  Concentration:  Concentration: Fair and Attention Span: Fair  Recall:  [employer] of Knowledge: Fair  Language: Fair  Akathisia:  No  Handed:  Right  AIMS (if indicated): denies tremors, rigidity,stiffness  Assets:  Communication Skills Desire for Improvement Social Support  ADL's:  Intact  Cognition: WNL  Sleep:  improving   Screenings:   Assessment and Plan: Diamantina is a 38 year old Caucasian female, single, lives in [place] with her mother, has a history of  bipolar disorder, PTSD, social anxiety disorder, presented to clinic today for a follow-up visit.  Patient is biologically predisposed given her history of trauma, family history of mental health problems.  She also has psychosocial stressors of  dealing with chronic mental health problems.  Patient is currently making progress on the current medication regimen, however continues to have some mood lability.  Will continue plan as noted below.  Plan For bipolar disorder-some progress Lithium 300 mg p.o. daily with breakfast Depakote 1000 mg p.o. daily Discussed Depakote and lithium level-level subtherapeutic at 0.1 dated 02/02/2018, Depakote level-therapeutic-58.  Patient wants to stay on the same dosage and does not want any dosage changes. Increase Zyprexa Zydis to 10 mg p.o. nightly  For PTSD-unstable Referred for CBT  For social anxiety disorder-unstable For for CBT-she will start CBT through soon. Continue hydroxyzine 25 mg p.o. twice daily as needed.  Follow-up in clinic in 4 weeks or sooner if needed.  I have spent atleast 25 minutes face to face with  patient today. More than 50 % of the time was spent for psychoeducation and supportive psychotherapy and care coordination.  This note was generated in part or whole with voice recognition software. Voice recognition is usually quite accurate but there are transcription errors that can and very often do occur. I apologize for any typographical errors that were not detected and corrected.           Jomarie LongsSaramma Kalyssa Anker, MD 03/18/2018, 5:39 PM

## 2018-04-15 ENCOUNTER — Ambulatory Visit (INDEPENDENT_AMBULATORY_CARE_PROVIDER_SITE_OTHER): Payer: BLUE CROSS/BLUE SHIELD | Admitting: Psychiatry

## 2018-04-15 ENCOUNTER — Other Ambulatory Visit: Payer: Self-pay

## 2018-04-15 ENCOUNTER — Encounter: Payer: Self-pay | Admitting: Psychiatry

## 2018-04-15 VITALS — BP 163/73 | HR 93 | Temp 98.9°F | Wt 264.6 lb

## 2018-04-15 DIAGNOSIS — F4312 Post-traumatic stress disorder, chronic: Secondary | ICD-10-CM

## 2018-04-15 DIAGNOSIS — F3162 Bipolar disorder, current episode mixed, moderate: Secondary | ICD-10-CM

## 2018-04-15 DIAGNOSIS — F4010 Social phobia, unspecified: Secondary | ICD-10-CM | POA: Diagnosis not present

## 2018-04-15 MED ORDER — DIVALPROEX SODIUM ER 500 MG PO TB24: 1000.0000 mg | ORAL_TABLET | Freq: Every day | ORAL | 0 refills | Status: DC

## 2018-04-15 NOTE — Progress Notes (Signed)
BH MD OP Progress Note  04/16/2018 1:04 PM Pamela Rasmussen  MRN:  [id number]  Chief Complaint: ' I am here for follow up." Chief Complaint    Follow-up; Medication Refill     HPI: Pamela Rasmussen is a 38 year old Caucasian female, single, lives in [place] with her mother, has a history of bipolar disorder, PTSD, social anxiety disorder, presented to clinic today for a follow-up visit.  Patient today reports she is compliant on her medications.  She reports she does not have any side effects to the medications.  Patient reports her mood symptoms as improving.  She denies any significant anxiety or mood lability.  She reports sleep is good.  She reports appetite is fair.  Patient denies any suicidality, homicidality or perceptual disturbances.  Patient continues to work for the [employer].  She reports work is going well.  She reports she tries to exercise, goes for walks with her mother.  Patient denies any other concerns today Visit Diagnosis:    ICD-10-CM   1. Bipolar 1 disorder, mixed, moderate (HCC) F31.62 divalproex (DEPAKOTE ER) 500 MG 24 hr tablet  2. Chronic post-traumatic stress disorder (PTSD) F43.12 divalproex (DEPAKOTE ER) 500 MG 24 hr tablet  3. Social anxiety disorder F40.10     Past Psychiatric History: I have reviewed past psychiatric history from my progress note on 01/04/2018.  Past trials of Celexa, Lexapro, olanzapine, lithium, Wellbutrin, Depakote, Risperdal.  Past Medical History:  Past Medical History:  Diagnosis Date  . Anxiety   . Bipolar 1 disorder (HCC)   . Vertigo     Past Surgical History:  Procedure Laterality Date  . CHOLECYSTECTOMY      Family Psychiatric History: I have reviewed family psychiatric history from my progress note on 01/04/2018.  Family History:  Family History  Problem Relation Age of Onset  . Bipolar disorder Mother   . Schizophrenia Maternal Aunt   . Bipolar disorder Paternal Grandmother   . Schizophrenia Paternal  Grandmother     Social History: Reviewed social history from my progress note on 01/04/2018. Social History   Socioeconomic History  . Marital status: Significant Other    Spouse name: Not on file  . Number of children: 9  . Years of education: Not on file  . Highest education level: Associate degree: occupational, Scientist, product/process development, or vocational program  Occupational History    Comment: not employed  Social Needs  . Financial resource strain: Not hard at all  . Food insecurity:    Worry: Never true    Inability: Never true  . Transportation needs:    Medical: Yes    Non-medical: Yes  Tobacco Use  . Smoking status: Never Smoker  . Smokeless tobacco: Never Used  Substance and Sexual Activity  . Alcohol use: No  . Drug use: No  . Sexual activity: Yes  Lifestyle  . Physical activity:    Days per week: 0 days    Minutes per session: 0 min  . Stress: Rather much  Relationships  . Social connections:    Talks on phone: Not on file    Gets together: Not on file    Attends religious service: 1 to 4 times per year    Active member of club or organization: No    Attends meetings of clubs or organizations: Never    Relationship status: Never married  Other Topics Concern  . Not on file  Social History Narrative  . Not on file    Allergies:  Allergies  Allergen Reactions  . Levaquin [Levofloxacin In D5w] Itching  . Penicillins Rash    Has patient had a PCN reaction causing immediate rash, facial/tongue/throat swelling, SOB or lightheadedness with hypotension: No Has patient had a PCN reaction causing severe rash involving mucus membranes or skin necrosis: No Has patient had a PCN reaction that required hospitalization: No Has patient had a PCN reaction occurring within the last 10 years: No If all of the above answers are "NO", then may proceed with Cephalosporin use.     Metabolic Disorder Labs: No results found for: HGBA1C, MPG Lab Results  Component Value Date    PROLACTIN 6.9 01/11/2018   No results found for: CHOL, TRIG, HDL, CHOLHDL, VLDL, LDLCALC No results found for: TSH  Therapeutic Level Labs: Lab Results  Component Value Date   LITHIUM 0.1 (L) 02/01/2018   LITHIUM 0.55 (L) 09/01/2017   Lab Results  Component Value Date   VALPROATE 58 02/01/2018   VALPROATE 62 01/11/2018   No components found for:  CBMZ  Current Medications: Current Outpatient Medications  Medication Sig Dispense Refill  . divalproex (DEPAKOTE ER) 500 MG 24 hr tablet Take 2 tablets (1,000 mg total) by mouth at bedtime. 180 tablet 0  . hydrOXYzine (VISTARIL) 25 MG capsule Take 1 capsule (25 mg total) by mouth 3 (three) times daily as needed. 90 capsule 1  . lithium 300 MG tablet Take 1 tablet (300 mg total) by mouth daily with breakfast. 90 tablet 1  . medroxyPROGESTERone (DEPO-PROVERA) 150 MG/ML injection every 3 (three) months    . OLANZapine zydis (ZYPREXA ZYDIS) 10 MG disintegrating tablet Take 1 tablet (10 mg total) by mouth at bedtime. Mood 90 tablet 0   No current facility-administered medications for this visit.      Musculoskeletal: Strength & Muscle Tone: within normal limits Gait & Station: normal Patient leans: N/A  Psychiatric Specialty Exam: Review of Systems  Psychiatric/Behavioral: The patient is nervous/anxious.   All other systems reviewed and are negative.   Blood pressure (!) 163/73, pulse 93, temperature 98.9 F (37.2 C), temperature source Oral, weight 264 lb 9.6 oz (120 kg).Body mass index is 45.42 kg/m.  General Appearance: Casual  Eye Contact:  Fair  Speech:  Clear and Coherent  Volume:  Normal  Mood:  Anxiousimproving  Affect:  Appropriate  Thought Process:  Goal Directed and Descriptions of Associations: Intact  Orientation:  Full (Time, Place, and Person)  Thought Content: Logical   Suicidal Thoughts:  No  Homicidal Thoughts:  No  Memory:  Immediate;   Fair Recent;   Fair Remote;   Fair  Judgement:  Fair  Insight:   Fair  Psychomotor Activity:  Normal  Concentration:  Concentration: Fair and Attention Span: Fair  Recall:  [employer] of Knowledge: Fair  Language: Fair  Akathisia:  No  Handed:  Right  AIMS (if indicated):0  Assets:  Communication Skills Desire for Improvement Social Support  ADL's:  Intact  Cognition: WNL  Sleep:  Fair   Screenings:   Assessment and Plan: Destina is a 38 year old Caucasian female, single, lives in [place] with her mother, has a history of bipolar disorder, PTSD, social anxiety disorder, presented to clinic today for a follow-up visit.  Patient is biologically predisposed given her history of trauma, family history of mental health problems.  She also has psychosocial stressors of dealing with chronic mental health problems.  Patient reports she is currently making progress on the current medication regimen.  Plan as noted below.  Plan For bipolar disorder-improving Lithium 300 mg p.o. daily with breakfast Depakote 1000 mg p.o. daily Depakote level-therapeutic-58 dated 02/02/2018. Lithium level subtherapeutic at 0.1 dated 02/02/2018. We will continue same dosage for now. Zyprexa Zydis 10 mg p.o. nightly.  For PTSD-improving Patient was referred for CBT.  For anxiety-social-improving Continue hydroxyzine 25 mg p.o. twice daily as needed.  Pt with elevated blood pressure reading-its a one time reading and reported to keep a log and follow-up with her primary medical doctor.  Follow-up in clinic in 1 month or sooner if needed  I have spent atleast 15 minutes face to face with patient today. More than 50 % of the time was spent for psychoeducation and supportive psychotherapy and care coordination.  This note was generated in part or whole with voice recognition software. Voice recognition is usually quite accurate but there are transcription errors that can and very often do occur. I apologize for any typographical errors that were not detected and  corrected.      Jomarie LongsSaramma Gurjot Brisco, MD 04/16/2018, 1:04 PM

## 2018-04-16 ENCOUNTER — Encounter: Payer: Self-pay | Admitting: Psychiatry

## 2018-05-17 ENCOUNTER — Ambulatory Visit: Payer: BLUE CROSS/BLUE SHIELD | Admitting: Psychiatry

## 2018-06-01 ENCOUNTER — Ambulatory Visit (INDEPENDENT_AMBULATORY_CARE_PROVIDER_SITE_OTHER): Payer: BLUE CROSS/BLUE SHIELD | Admitting: Psychiatry

## 2018-06-01 ENCOUNTER — Encounter: Payer: Self-pay | Admitting: Psychiatry

## 2018-06-01 ENCOUNTER — Other Ambulatory Visit: Payer: Self-pay

## 2018-06-01 DIAGNOSIS — F4010 Social phobia, unspecified: Secondary | ICD-10-CM | POA: Diagnosis not present

## 2018-06-01 DIAGNOSIS — F4312 Post-traumatic stress disorder, chronic: Secondary | ICD-10-CM | POA: Diagnosis not present

## 2018-06-01 DIAGNOSIS — F3162 Bipolar disorder, current episode mixed, moderate: Secondary | ICD-10-CM | POA: Diagnosis not present

## 2018-06-01 MED ORDER — OLANZAPINE 10 MG PO TBDP: 10.0000 mg | ORAL_TABLET | Freq: Every day | ORAL | 0 refills | Status: DC

## 2018-06-01 MED ORDER — HYDROXYZINE PAMOATE 25 MG PO CAPS: 25.0000 mg | ORAL_CAPSULE | Freq: Three times a day (TID) | ORAL | 1 refills | Status: DC | PRN

## 2018-06-01 NOTE — Progress Notes (Signed)
Virtual Visit via Telephone Note  I connected with Pamela Rasmussen on 06/01/18 at 10:15 AM EDT by telephone and verified that I am speaking with the correct person using two identifiers.   I discussed the limitations, risks, security and privacy concerns of performing an evaluation and management service by telephone and the availability of in person appointments. I also discussed with the patient that there may be a patient responsible charge related to this service. The patient expressed understanding and agreed to proceed.   I discussed the assessment and treatment plan with the patient. The patient was provided an opportunity to ask questions and all were answered. The patient agreed with the plan and demonstrated an understanding of the instructions.   The patient was advised to call back or seek an in-person evaluation if the symptoms worsen or if the condition fails to improve as anticipated.   BH MD OP Progress Note  06/01/2018 5:33 PM [name]  MRN:  [id number]  Chief Complaint:  Chief Complaint    Follow-up     HPI: Pamela Rasmussen is a 38 year old Caucasian female, single, lives in [place] with her mother, has a history of bipolar disorder, PTSD, social anxiety disorder was evaluated by phone today.  Patient reports she continues to be able to go to work.  Her work is considered essential.  She reports she continues to have transportation since her neighbor has been helping out.  She and her mother are currently staying safe from the COVID-19.  She is coping okay so far.  She reports she does not have any significant mood lability at this time.  She reports sleep is good.  She reports appetite is fair.  Patient denies any suicidality, homicidality or perceptual disturbances.  Patient is compliant on her medications.  She denies side effects.  Patient denies any other concerns today. Visit Diagnosis:    ICD-10-CM   1. Bipolar 1 disorder, mixed, moderate  (HCC) F31.62 hydrOXYzine (VISTARIL) 25 MG capsule    OLANZapine zydis (ZYPREXA ZYDIS) 10 MG disintegrating tablet  2. Chronic post-traumatic stress disorder (PTSD) F43.12 hydrOXYzine (VISTARIL) 25 MG capsule  3. Social anxiety disorder F40.10 hydrOXYzine (VISTARIL) 25 MG capsule    Past Psychiatric History: Reviewed past psychiatric history from my progress note on 01/04/2018.  Past trials of Celexa, Lexapro, olanzapine, lithium, Wellbutrin, Depakote, risperidone.  Past Medical History:  Past Medical History:  Diagnosis Date  . Anxiety   . Bipolar 1 disorder (HCC)   . Vertigo     Past Surgical History:  Procedure Laterality Date  . CHOLECYSTECTOMY      Family Psychiatric History: Reviewed family psychiatric history from my progress note on 01/04/2018.  Family History:  Family History  Problem Relation Age of Onset  . Bipolar disorder Mother   . Schizophrenia Maternal Aunt   . Bipolar disorder Paternal Grandmother   . Schizophrenia Paternal Grandmother     Social History: Reviewed social history from my progress note on 01/04/2018. Social History   Socioeconomic History  . Marital status: Significant Other    Spouse name: Not on file  . Number of children: 9  . Years of education: Not on file  . Highest education level: Associate degree: occupational, Scientist, product/process development, or vocational program  Occupational History    Comment: not employed  Social Needs  . Financial resource strain: Not hard at all  . Food insecurity:    Worry: Never true    Inability: Never true  . Transportation needs:  Medical: Yes    Non-medical: Yes  Tobacco Use  . Smoking status: Never Smoker  . Smokeless tobacco: Never Used  Substance and Sexual Activity  . Alcohol use: No  . Drug use: No  . Sexual activity: Yes  Lifestyle  . Physical activity:    Days per week: 0 days    Minutes per session: 0 min  . Stress: Rather much  Relationships  . Social connections:    Talks on phone: Not on file     Gets together: Not on file    Attends religious service: 1 to 4 times per year    Active member of club or organization: No    Attends meetings of clubs or organizations: Never    Relationship status: Never married  Other Topics Concern  . Not on file  Social History Narrative  . Not on file    Allergies:  Allergies  Allergen Reactions  . Levaquin [Levofloxacin In D5w] Itching  . Penicillins Rash    Has patient had a PCN reaction causing immediate rash, facial/tongue/throat swelling, SOB or lightheadedness with hypotension: No Has patient had a PCN reaction causing severe rash involving mucus membranes or skin necrosis: No Has patient had a PCN reaction that required hospitalization: No Has patient had a PCN reaction occurring within the last 10 years: No If all of the above answers are "NO", then may proceed with Cephalosporin use.     Metabolic Disorder Labs: No results found for: HGBA1C, MPG Lab Results  Component Value Date   PROLACTIN 6.9 01/11/2018   No results found for: CHOL, TRIG, HDL, CHOLHDL, VLDL, LDLCALC No results found for: TSH  Therapeutic Level Labs: Lab Results  Component Value Date   LITHIUM 0.1 (L) 02/01/2018   LITHIUM 0.55 (L) 09/01/2017   Lab Results  Component Value Date   VALPROATE 58 02/01/2018   VALPROATE 62 01/11/2018   No components found for:  CBMZ  Current Medications: Current Outpatient Medications  Medication Sig Dispense Refill  . divalproex (DEPAKOTE ER) 500 MG 24 hr tablet Take 2 tablets (1,000 mg total) by mouth at bedtime. 180 tablet 0  . hydrOXYzine (VISTARIL) 25 MG capsule Take 1 capsule (25 mg total) by mouth 3 (three) times daily as needed. 90 capsule 1  . lithium 300 MG tablet Take 1 tablet (300 mg total) by mouth daily with breakfast. 90 tablet 1  . medroxyPROGESTERone (DEPO-PROVERA) 150 MG/ML injection every 3 (three) months    . OLANZapine zydis (ZYPREXA ZYDIS) 10 MG disintegrating tablet Take 1 tablet (10 mg total) by  mouth at bedtime. Mood 90 tablet 0   No current facility-administered medications for this visit.      Musculoskeletal: Strength & Muscle Tone: UTA Gait & Station: UTA Patient leans: N/A  Psychiatric Specialty Exam: Review of Systems  Psychiatric/Behavioral: The patient is nervous/anxious.   All other systems reviewed and are negative.   There were no vitals taken for this visit.There is no height or weight on file to calculate BMI.  General Appearance: UTA  Eye Contact:  UTA  Speech:  Normal Rate  Volume:  Normal  Mood:  Anxious  Affect:  UTA  Thought Process:  Goal Directed and Descriptions of Associations: Intact  Orientation:  Full (Time, Place, and Person)  Thought Content: Logical   Suicidal Thoughts:  No  Homicidal Thoughts:  No  Memory:  Immediate;   Fair Recent;   Fair Remote;   Fair  Judgement:  Fair  Insight:  Fair  Psychomotor Activity:  UTA  Concentration:  Concentration: Fair and Attention Span: Fair  Recall:  [employer] of Knowledge: Fair  Language: Fair  Akathisia:  No  Handed:  Right  AIMS (if indicated): UTA  Assets:  Communication Skills Desire for Improvement Social Support  ADL's:  Intact  Cognition: WNL  Sleep:  Fair   Screenings:   Assessment and Plan: Pamela Rasmussen is a 38 year old Caucasian female, single, lives in [place] with her mother, has a history of bipolar disorder, PTSD, social anxiety disorder was evaluated by phone today.  Patient is biologically predisposed given her history of trauma, family history of mental health problems.  She also has psychosocial stressors of dealing with chronic mental health problems.  Patient currently is making progress on the current medication regimen.  Plan as noted below.  Plan For bipolar disorder-improving Lithium 300 mg p.o. daily with breakfast Depakote 1000 mg p.o. daily. Depakote level-therapeutic-58 dated 02/02/2018. Lithium level subtherapeutic at 0.1 dated 02/02/2018.   Zyprexa Zydis 10  mg p.o. nightly.   PTSD-improving Patient was referred for CBT.  For anxiety disorder-improving Continue hydroxyzine 25 mg p.o. twice daily as needed  Follow-up in clinic in 4 to 6 weeks or sooner if needed.  I have spent atleast 15 minutes non face to face with patient today. More than 50 % of the time was spent for psychoeducation and supportive psychotherapy and care coordination.  This note was generated in part or whole with voice recognition software. Voice recognition is usually quite accurate but there are transcription errors that can and very often do occur. I apologize for any typographical errors that were not detected and corrected.       Jomarie Longs, MD 06/01/2018, 5:33 PM

## 2018-07-13 ENCOUNTER — Other Ambulatory Visit: Payer: Self-pay

## 2018-07-13 ENCOUNTER — Ambulatory Visit (INDEPENDENT_AMBULATORY_CARE_PROVIDER_SITE_OTHER): Payer: BLUE CROSS/BLUE SHIELD | Admitting: Psychiatry

## 2018-07-13 ENCOUNTER — Encounter: Payer: Self-pay | Admitting: Psychiatry

## 2018-07-13 DIAGNOSIS — F4312 Post-traumatic stress disorder, chronic: Secondary | ICD-10-CM

## 2018-07-13 DIAGNOSIS — F3162 Bipolar disorder, current episode mixed, moderate: Secondary | ICD-10-CM | POA: Diagnosis not present

## 2018-07-13 DIAGNOSIS — F4010 Social phobia, unspecified: Secondary | ICD-10-CM | POA: Diagnosis not present

## 2018-07-13 MED ORDER — DIVALPROEX SODIUM ER 500 MG PO TB24: 1000.0000 mg | ORAL_TABLET | Freq: Every day | ORAL | 1 refills | Status: DC

## 2018-07-13 MED ORDER — OLANZAPINE 10 MG PO TBDP: 10.0000 mg | ORAL_TABLET | Freq: Every day | ORAL | 1 refills | Status: DC

## 2018-07-13 MED ORDER — LITHIUM CARBONATE 300 MG PO TABS: 300.0000 mg | ORAL_TABLET | Freq: Every day | ORAL | 1 refills | Status: DC

## 2018-07-13 NOTE — Progress Notes (Signed)
Virtual Visit via Video Note  I connected with Pamela Rasmussen on 07/13/18 at 11:30 AM EDT by a video enabled telemedicine application and verified that I am speaking with the correct person using two identifiers.   I discussed the limitations of evaluation and management by telemedicine and the availability of in person appointments. The patient expressed understanding and agreed to proceed.  I discussed the assessment and treatment plan with the patient. The patient was provided an opportunity to ask questions and all were answered. The patient agreed with the plan and demonstrated an understanding of the instructions.   The patient was advised to call back or seek an in-person evaluation if the symptoms worsen or if the condition fails to improve as anticipated.   [facility] MD OP Progress Note  07/13/2018 12:52 PM Pamela Rasmussen  MRN:  [id number]  Chief Complaint:  Chief Complaint    Follow-up     HPI: Pamela Rasmussen is a 38 year old Caucasian female, single, lives in [place] with her mother, has a history of bipolar disorder, PTSD, social anxiety disorder was evaluated by telemedicine today.  A video consult was initiated however due to connection problem it had to be changed to a phone call during the session.  Patient today reports she is currently doing well on the current medication regimen.  She denies any problems with her medications.  She reports her mood symptoms are stable.  She is sleeping okay.  She denies any suicidality, homicidality or perceptual disturbances.  Patient denies any significant anxiety symptoms at this time.  She reports work is going well and she stays busy doing so.  She is excited about the fact that she is going to get a new car next week.  Patient continues to live with her mother.  They support each other.  Patient denies any other concerns today.   Visit Diagnosis:    ICD-10-CM   1. Bipolar 1 disorder, mixed, moderate (HCC) F31.62 lithium 300  MG tablet   improving  2. Chronic post-traumatic stress disorder (PTSD) F43.12 divalproex (DEPAKOTE ER) 500 MG 24 hr tablet  3. Social anxiety disorder F40.10 divalproex (DEPAKOTE ER) 500 MG 24 hr tablet    OLANZapine zydis (ZYPREXA ZYDIS) 10 MG disintegrating tablet    Past Psychiatric History: Reviewed past psychiatric history from my progress note on 01/04/2018.  Past trials of Celexa, Lexapro, olanzapine, lithium, Wellbutrin, Depakote, risperidone  Past Medical History:  Past Medical History:  Diagnosis Date  . Anxiety   . Bipolar 1 disorder ([place])   . Vertigo     Past Surgical History:  Procedure Laterality Date  . CHOLECYSTECTOMY      Family Psychiatric History: Reviewed family psychiatric history from my progress note on 01/04/2018.  Family History:  Family History  Problem Relation Age of Onset  . Bipolar disorder Mother   . Schizophrenia Maternal Aunt   . Bipolar disorder Paternal Grandmother   . Schizophrenia Paternal Grandmother     Social History: Reviewed social history from my progress note on 01/04/2018. Social History   Socioeconomic History  . Marital status: Significant Other    Spouse name: Not on file  . Number of children: 66  . Years of education: Not on file  . Highest education level: Associate degree: occupational, Hotel manager, or vocational program  Occupational History    Comment: not employed  Social Needs  . Financial resource strain: Not hard at all  . Food insecurity:    Worry: Never true    Inability:  Never true  . Transportation needs:    Medical: Yes    Non-medical: Yes  Tobacco Use  . Smoking status: Never Smoker  . Smokeless tobacco: Never Used  Substance and Sexual Activity  . Alcohol use: No  . Drug use: No  . Sexual activity: Yes  Lifestyle  . Physical activity:    Days per week: 0 days    Minutes per session: 0 min  . Stress: Rather much  Relationships  . Social connections:    Talks on phone: Not on file    Gets  together: Not on file    Attends religious service: 1 to 4 times per year    Active member of club or organization: No    Attends meetings of clubs or organizations: Never    Relationship status: Never married  Other Topics Concern  . Not on file  Social History Narrative  . Not on file    Allergies:  Allergies  Allergen Reactions  . Levaquin [Levofloxacin In D5w] Itching  . Penicillins Rash    Has patient had a PCN reaction causing immediate rash, facial/tongue/throat swelling, SOB or lightheadedness with hypotension: No Has patient had a PCN reaction causing severe rash involving mucus membranes or skin necrosis: No Has patient had a PCN reaction that required hospitalization: No Has patient had a PCN reaction occurring within the last 10 years: No If all of the above answers are "NO", then may proceed with Cephalosporin use.     Metabolic Disorder Labs: No results found for: HGBA1C, MPG Lab Results  Component Value Date   PROLACTIN 6.9 01/11/2018   No results found for: CHOL, TRIG, HDL, CHOLHDL, VLDL, LDLCALC No results found for: TSH  Therapeutic Level Labs: Lab Results  Component Value Date   LITHIUM 0.1 (L) 02/01/2018   LITHIUM 0.55 (L) 09/01/2017   Lab Results  Component Value Date   VALPROATE 58 02/01/2018   VALPROATE 62 01/11/2018   No components found for:  CBMZ  Current Medications: Current Outpatient Medications  Medication Sig Dispense Refill  . diclofenac (VOLTAREN) 75 MG EC tablet     . divalproex (DEPAKOTE ER) 500 MG 24 hr tablet Take 2 tablets (1,000 mg total) by mouth at bedtime. 180 tablet 1  . hydrOXYzine (VISTARIL) 25 MG capsule Take 1 capsule (25 mg total) by mouth 3 (three) times daily as needed. 90 capsule 1  . lithium 300 MG tablet Take 1 tablet (300 mg total) by mouth daily with breakfast. 90 tablet 1  . medroxyPROGESTERone (DEPO-PROVERA) 150 MG/ML injection every 3 (three) months    . OLANZapine zydis (ZYPREXA ZYDIS) 10 MG  disintegrating tablet Take 1 tablet (10 mg total) by mouth at bedtime. Mood 90 tablet 1   No current facility-administered medications for this visit.      Musculoskeletal: Strength & Muscle Tone: reports as WNL Gait & Station: reports as WNL Patient leans: N/A  Psychiatric Specialty Exam: Review of Systems  Psychiatric/Behavioral: The patient is not nervous/anxious.   All other systems reviewed and are negative.   There were no vitals taken for this visit.There is no height or weight on file to calculate BMI.  General Appearance: UTA  Eye Contact:  UTA  Speech:  Clear and Coherent  Volume:  Normal  Mood:  Euthymic  Affect:  UTA  Thought Process:  Goal Directed and Descriptions of Associations: Intact  Orientation:  Full (Time, Place, and Person)  Thought Content: Logical   Suicidal Thoughts:  No  Homicidal  Thoughts:  No  Memory:  Immediate;   Fair Recent;   Fair Remote;   Fair  Judgement:  Fair  Insight:  Fair  Psychomotor Activity:  UTA  Concentration:  Concentration: Fair and Attention Span: Fair  Recall:  [employer]Fair  Fund of Knowledge: Fair  Language: Fair  Akathisia:  No  Handed:  Right  AIMS (if indicated): Denies tremors, rigidity  Assets:  Communication Skills Desire for Improvement Housing Social Support  ADL's:  Intact  Cognition: WNL  Sleep:  Fair   Screenings:   Assessment and Plan: Zella BallRobin is a 38 year old Caucasian female, single, lives in [place] with her mother, has a history of bipolar disorder, PTSD, social anxiety disorder was evaluated today by telemedicine.  Patient is biologically predisposed given her history of trauma, family history of mental health problems.  She also has psychosocial stressors of dealing with chronic mental health problems.  Patient currently is doing well on the current medication regimen.  Plan as noted below.  Plan For bipolar disorder-improving Lithium 300 mg p.o. daily with breakfast Depakote 1000 mg p.o.  daily Depakote-therapeutic-58 dated 02/02/2018 Lithium level subtherapeutic at 0.1 dated 02/02/2018 Zyprexa Zydis 10 mg p.o. nightly.  PTSD- improving Patient was referred for CBT  For anxiety disorder-improving Hydroxyzine 25 mg p.o. twice daily as needed.  We will order labs-Depakote level and lithium level.  Lab slip will be mailed to her today.  Follow-up in clinic in 4 to 6 weeks or sooner if needed.  I have spent atleast 15 minutes non face to face with patient today. More than 50 % of the time was spent for psychoeducation and supportive psychotherapy and care coordination.  This note was generated in part or whole with voice recognition software. Voice recognition is usually quite accurate but there are transcription errors that can and very often do occur. I apologize for any typographical errors that were not detected and corrected.        Jomarie LongsSaramma Chrstopher Malenfant, MD 07/13/2018, 12:52 PM

## 2018-07-23 ENCOUNTER — Other Ambulatory Visit: Payer: Self-pay | Admitting: Psychiatry

## 2018-07-24 LAB — VALPROIC ACID LEVEL: Valproic Acid Lvl: 41 ug/mL — ABNORMAL LOW (ref 50–100)

## 2018-07-24 LAB — LITHIUM LEVEL: Lithium Lvl: 0.3 mmol/L — ABNORMAL LOW (ref 0.6–1.2)

## 2018-09-02 ENCOUNTER — Ambulatory Visit: Payer: BLUE CROSS/BLUE SHIELD | Admitting: Psychiatry

## 2018-10-05 ENCOUNTER — Other Ambulatory Visit: Payer: Self-pay

## 2018-10-05 ENCOUNTER — Encounter: Payer: Self-pay | Admitting: Psychiatry

## 2018-10-05 ENCOUNTER — Ambulatory Visit (INDEPENDENT_AMBULATORY_CARE_PROVIDER_SITE_OTHER): Payer: BLUE CROSS/BLUE SHIELD | Admitting: Psychiatry

## 2018-10-05 DIAGNOSIS — F4312 Post-traumatic stress disorder, chronic: Secondary | ICD-10-CM | POA: Diagnosis not present

## 2018-10-05 DIAGNOSIS — F4010 Social phobia, unspecified: Secondary | ICD-10-CM | POA: Insufficient documentation

## 2018-10-05 DIAGNOSIS — F3162 Bipolar disorder, current episode mixed, moderate: Secondary | ICD-10-CM | POA: Insufficient documentation

## 2018-10-05 MED ORDER — OLANZAPINE 15 MG PO TBDP: 15.0000 mg | ORAL_TABLET | Freq: Every day | ORAL | 1 refills | Status: DC

## 2018-10-05 NOTE — Progress Notes (Signed)
Virtual Visit via Telephone Note  I connected with Pamela Rasmussen on 10/05/18 at  9:15 AM EDT by telephone and verified that I am speaking with the correct person using two identifiers.   I discussed the limitations, risks, security and privacy concerns of performing an evaluation and management service by telephone and the availability of in person appointments. I also discussed with the patient that there may be a patient responsible charge related to this service. The patient expressed understanding and agreed to proceed.   I discussed the assessment and treatment plan with the patient. The patient was provided an opportunity to ask questions and all were answered. The patient agreed with the plan and demonstrated an understanding of the instructions.   The patient was advised to call back or seek an in-person evaluation if the symptoms worsen or if the condition fails to improve as anticipated.  [facility] MD OP Progress Note  10/05/2018 12:24 PM Pamela Rasmussen  MRN:  [id number]  Chief Complaint:  Chief Complaint    Follow-up     HPI: Pamela Rasmussen is a 38 year old Caucasian female, employed, single, lives in [place], has a history of bipolar disorder, PTSD, social anxiety disorder was evaluated by phone today.  Patient preferred to do a phone call.  Patient had difficulty connecting by video this a.m.  Patient today reports that she has been feeling anxious and slightly agitated on and off the past few weeks.  She reports she is worried about the current pandemic.  She reports sleep is okay.  She denies any suicidality, homicidality or perceptual disturbances.  She also reports feeling depressed on and off however is able to cope.  She reports she has been able to focus on her work and her mood symptoms does not affect her work at this time.  She continues to have good support system from her mother.  She is compliant on her medications as prescribed.  Discussed readjusting  her medications and she is interested in increasing her olanzapine.  We will continue Depakote and lithium at the same dosage.  Reviewed and discussed Depakote and lithium level resulted on 07/23/2018- discussed with patient that Depakote as well as lithium came back subtherapeutic.  Patient however was on lithium for several years at higher dosage and Depakote and Zyprexa was added since the lithium was ineffective.  Discussed with her that the Zyprexa dosage can be readjusted today and when she returns we can continue to work on the Depakote dosage if she continues to have mood symptoms. Visit Diagnosis:    ICD-10-CM   1. Bipolar 1 disorder, mixed, moderate (HCC)  F31.62 olanzapine zydis (ZYPREXA ZYDIS) 15 MG disintegrating tablet  2. Chronic post-traumatic stress disorder (PTSD)  F43.12   3. Social anxiety disorder  F40.10     Past Psychiatric History: I have reviewed past psychiatric history from my progress note on 01/04/2018.  Past trials of Celexa, Lexapro, olanzapine, lithium, Wellbutrin, Depakote, risperidone  Past Medical History:  Past Medical History:  Diagnosis Date  . Anxiety   . Bipolar 1 disorder ([place])   . Vertigo     Past Surgical History:  Procedure Laterality Date  . CHOLECYSTECTOMY      Family Psychiatric History: I have reviewed family psychiatric history from my progress note on 01/04/2018.  Family History:  Family History  Problem Relation Age of Onset  . Bipolar disorder Mother   . Schizophrenia Maternal Aunt   . Bipolar disorder Paternal Grandmother   . Schizophrenia Paternal Grandmother  Social History: Reviewed social history from my progress note on 01/04/2018. Social History   Socioeconomic History  . Marital status: Significant Other    Spouse name: Not on file  . Number of children: 9  . Years of education: Not on file  . Highest education level: Associate degree: occupational, Scientist, product/process developmenttechnical, or vocational program  Occupational History     Comment: not employed  Social Needs  . Financial resource strain: Not hard at all  . Food insecurity    Worry: Never true    Inability: Never true  . Transportation needs    Medical: Yes    Non-medical: Yes  Tobacco Use  . Smoking status: Never Smoker  . Smokeless tobacco: Never Used  Substance and Sexual Activity  . Alcohol use: No  . Drug use: No  . Sexual activity: Yes  Lifestyle  . Physical activity    Days per week: 0 days    Minutes per session: 0 min  . Stress: Rather much  Relationships  . Social Musicianconnections    Talks on phone: Not on file    Gets together: Not on file    Attends religious service: 1 to 4 times per year    Active member of club or organization: No    Attends meetings of clubs or organizations: Never    Relationship status: Never married  Other Topics Concern  . Not on file  Social History Narrative  . Not on file    Allergies:  Allergies  Allergen Reactions  . Levaquin [Levofloxacin In D5w] Itching  . Penicillins Rash    Has patient had a PCN reaction causing immediate rash, facial/tongue/throat swelling, SOB or lightheadedness with hypotension: No Has patient had a PCN reaction causing severe rash involving mucus membranes or skin necrosis: No Has patient had a PCN reaction that required hospitalization: No Has patient had a PCN reaction occurring within the last 10 years: No If all of the above answers are "NO", then may proceed with Cephalosporin use.     Metabolic Disorder Labs: No results found for: HGBA1C, MPG Lab Results  Component Value Date   PROLACTIN 6.9 01/11/2018   No results found for: CHOL, TRIG, HDL, CHOLHDL, VLDL, LDLCALC No results found for: TSH  Therapeutic Level Labs: Lab Results  Component Value Date   LITHIUM 0.3 (L) 07/23/2018   LITHIUM 0.1 (L) 02/01/2018   Lab Results  Component Value Date   VALPROATE 41 (L) 07/23/2018   VALPROATE 58 02/01/2018   No components found for:  CBMZ  Current  Medications: Current Outpatient Medications  Medication Sig Dispense Refill  . diclofenac (VOLTAREN) 75 MG EC tablet     . divalproex (DEPAKOTE ER) 500 MG 24 hr tablet Take 2 tablets (1,000 mg total) by mouth at bedtime. 180 tablet 1  . hydrOXYzine (VISTARIL) 25 MG capsule Take 1 capsule (25 mg total) by mouth 3 (three) times daily as needed. 90 capsule 1  . lithium 300 MG tablet Take 1 tablet (300 mg total) by mouth daily with breakfast. 90 tablet 1  . medroxyPROGESTERone (DEPO-PROVERA) 150 MG/ML injection every 3 (three) months    . olanzapine zydis (ZYPREXA ZYDIS) 15 MG disintegrating tablet Take 1 tablet (15 mg total) by mouth at bedtime. 30 tablet 1   No current facility-administered medications for this visit.      Musculoskeletal: Strength & Muscle Tone: UTA Gait & Station: Reports as normal Patient leans: N/A  Psychiatric Specialty Exam: Review of Systems  Psychiatric/Behavioral: Positive for  depression. The patient is nervous/anxious.   All other systems reviewed and are negative.   There were no vitals taken for this visit.There is no height or weight on file to calculate BMI.  General Appearance: UTA  Eye Contact:  UTA  Speech:  Clear and Coherent  Volume:  Normal  Mood:  Anxious and Depressed  Affect:  UTA  Thought Process:  Goal Directed and Descriptions of Associations: Intact  Orientation:  Full (Time, Place, and Person)  Thought Content: Logical   Suicidal Thoughts:  No  Homicidal Thoughts:  No  Memory:  Immediate;   Fair Recent;   Fair Remote;   Fair  Judgement:  Fair  Insight:  Fair  Psychomotor Activity:  UTA  Concentration:  Concentration: Fair and Attention Span: Fair  Recall:  [employer] of Knowledge: Fair  Language: Fair  Akathisia:  No  Handed:  Right  AIMS (if indicated): Denies tremors, rigidity  Assets:  Communication Skills Desire for Improvement Housing  ADL's:  Intact  Cognition: WNL  Sleep:  Fair   Screenings:   Assessment and  Plan: Pamela Rasmussen is a 38 year old Caucasian female, single, lives in [place] with her mother, has a history of bipolar disorder, PTSD, social anxiety disorder was evaluated by telemedicine today.  Patient is biologically predisposed given her history of trauma, family history of mental health problems.  She also has psychosocial stressors of dealing with chronic mental health problems.  Patient is also worried about the current pandemic at this time.  She is currently struggling with anxiety, agitation and mild depression.  Will benefit from the following medication readjustment.  Plan Bipolar disorder-some progress Continue lithium 300 mg p.o. daily with breakfast Reviewed and discussed Lithium level-subtherapeutic on 07/23/2018-0.3. Depakote 1000 mg p.o. daily. Depakote-subtherapeutic-41-6 /19 /2020. Increase Zyprexa Zydis to 15 mg p.o. nightly  PTSD-stable Patient was referred for CBT.  Anxiety disorder-improving Hydroxyzine 25 mg p.o. twice daily as needed  Follow-up in clinic in 2 to 3 weeks or sooner if needed.  September 22 at 11:30 AM  I have spent atleast 15 minutes non face to face with patient today. More than 50 % of the time was spent for psychoeducation and supportive psychotherapy and care coordination. This note was generated in part or whole with voice recognition software. Voice recognition is usually quite accurate but there are transcription errors that can and very often do occur. I apologize for any typographical errors that were not detected and corrected.       Jomarie Longs, MD 10/05/2018, 12:24 PM

## 2018-10-18 IMAGING — DX DG ELBOW COMPLETE 3+V*L*
4 series · 4 of 4 positions shown · non-contrast
Comparison: None.

CLINICAL DATA: Left lateral elbow pain for 1 month. No known
injury.

EXAM:
LEFT ELBOW - COMPLETE 3+ VIEW

[elbow ap]
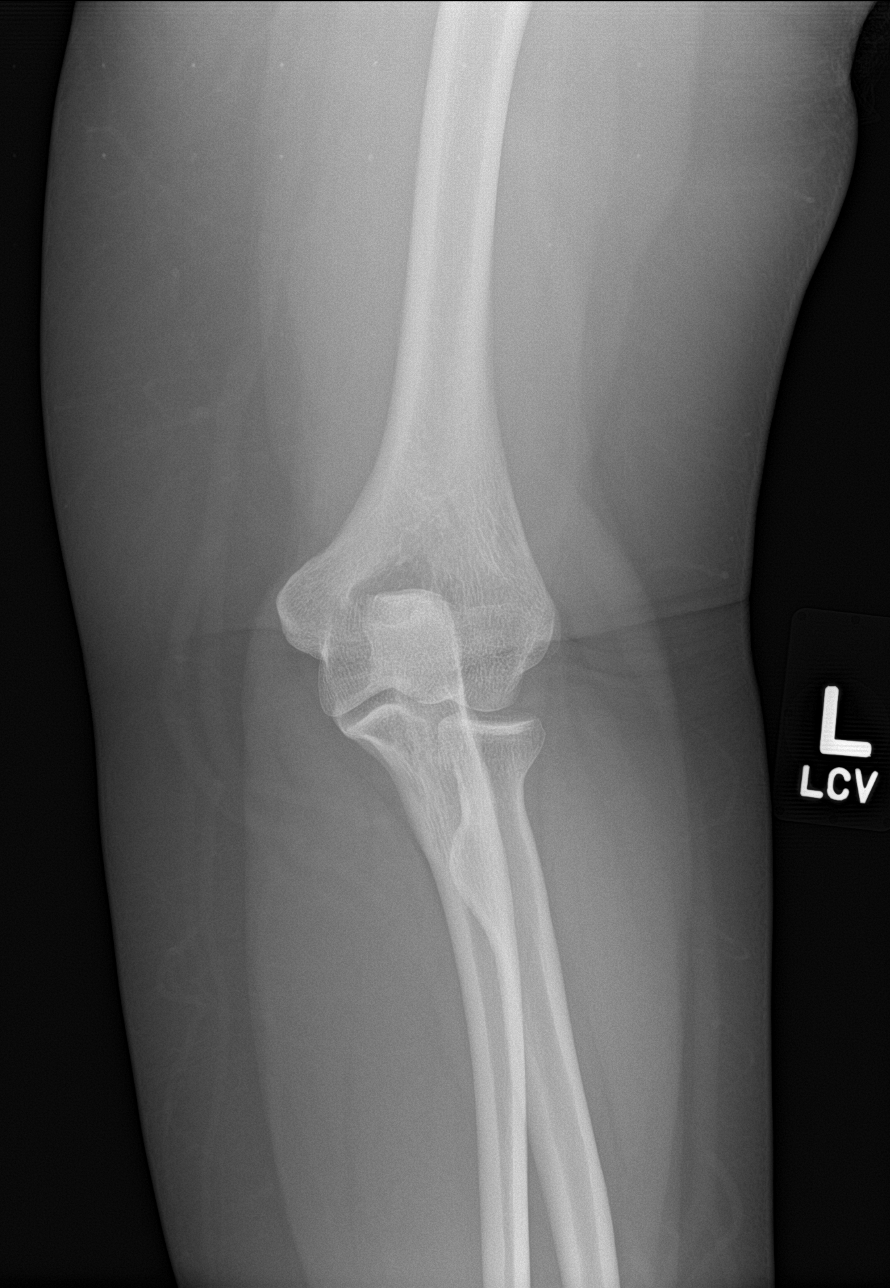

[elbow obl (1 of 2)]
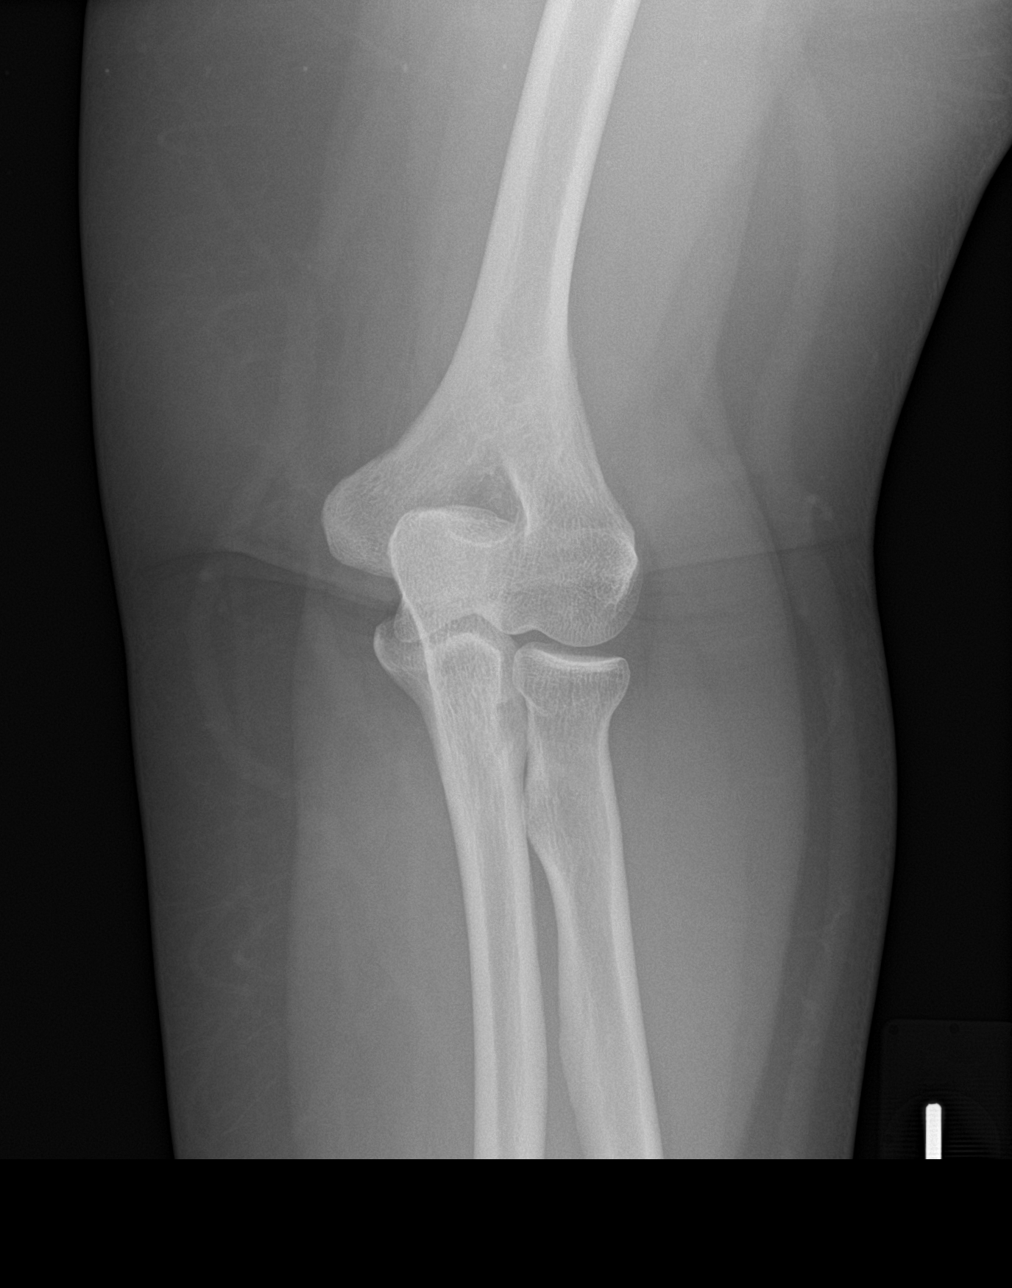

[elbow obl (2 of 2)]
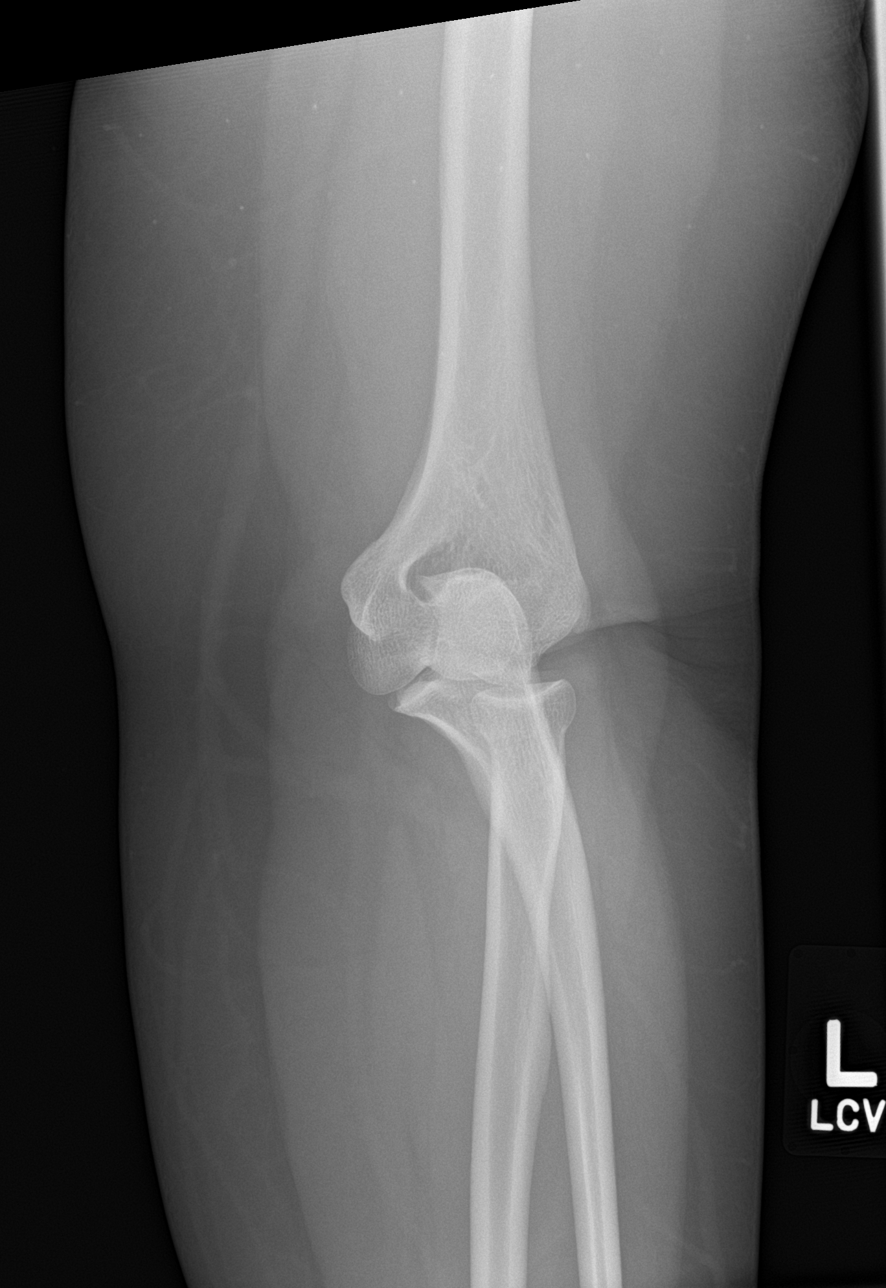

[elbow lat]
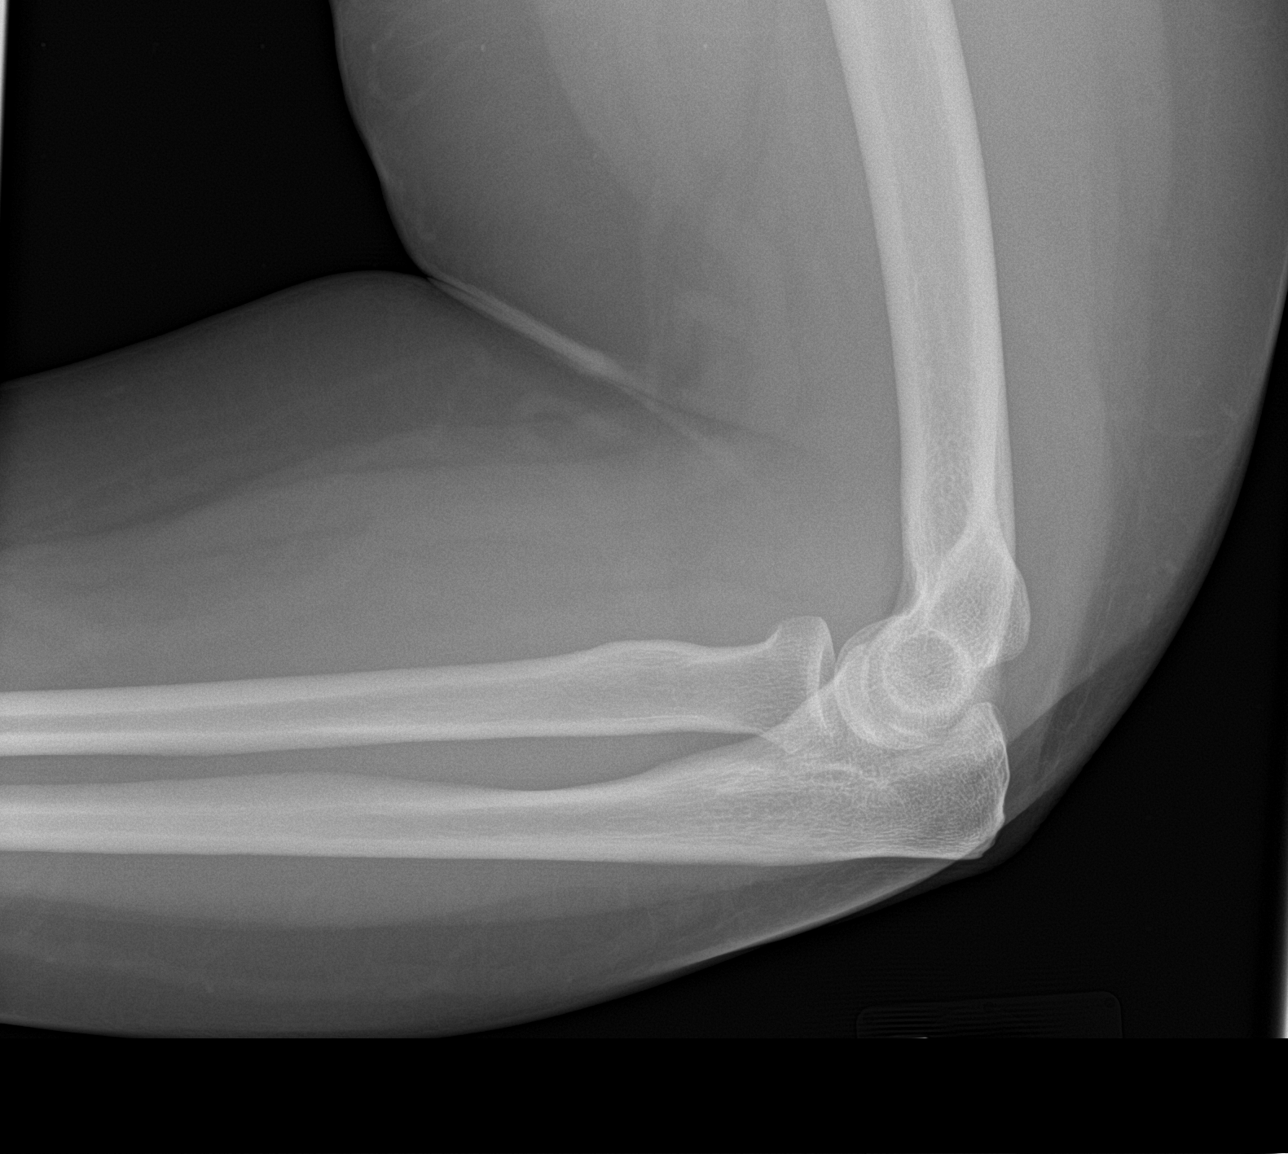

[4 of 4 positions shown; findings below may reference images not displayed]

FINDINGS: There is no evidence of fracture, dislocation, or joint effusion.
There is no evidence of arthropathy or other focal bone abnormality.
Soft tissues are unremarkable.
IMPRESSION: Negative.

## 2018-10-23 ENCOUNTER — Other Ambulatory Visit: Payer: Self-pay

## 2018-10-23 ENCOUNTER — Encounter: Payer: Self-pay | Admitting: Emergency Medicine

## 2018-10-23 ENCOUNTER — Emergency Department
Admission: EM | Admit: 2018-10-23 | Discharge: 2018-10-23 | Disposition: A | Discharge status: Home / Self Care | Payer: BLUE CROSS/BLUE SHIELD | Attending: Emergency Medicine | Admitting: Emergency Medicine

## 2018-10-23 DIAGNOSIS — K6289 Other specified diseases of anus and rectum: Secondary | ICD-10-CM | POA: Insufficient documentation

## 2018-10-23 NOTE — Discharge Instructions (Signed)
Call make an appointment with Dr. Vira Agar for further evaluation and also to schedule a colonoscopy since it is been 7 years since your last one and they did find polyps at that visit.  On today's visit there was no hemorrhoids or fissures noted.  There was a small skin tag present.  You can try sitz bath's to see if this helps with your rectal pain.  Return to the emergency department if any rectal bleeding or worsening of your rectal pain.

## 2018-10-23 NOTE — ED Notes (Addendum)
Pt st rectal pain for 4x days. C/o of pain when having BM and when sitting, denies bleeding. Pt st "it feels like a lump in there". Per pt, no hx of hemorrhoids.

## 2018-10-23 NOTE — ED Provider Notes (Signed)
[facility] Emergency Department Provider Note   ____________________________________________   First MD Initiated Contact with Patient 10/23/18 1732     (approximate)  I have reviewed the triage vital signs and the nursing notes.   HISTORY  Chief Complaint Rectal Pain   HPI Pamela Rasmussen is a 38 y.o. female presents to the ED with complaint of 4 days of rectal pain mostly when she is having a BM or sitting.  Patient denies any bleeding or constipation.  Patient does not have a history of hemorrhoids.  Patient states that she had a colonoscopy approximately 6 or 7 years ago in which polyps were removed.  She is also been using some over-the-counter hemorrhoid cream without any relief.  She rates her pain as an 8 out of 10.       Past Medical History:  Diagnosis Date  . Anxiety   . Bipolar 1 disorder ([place])   . Vertigo     Patient Active Problem List   Diagnosis Date Noted  . Bipolar 1 disorder, mixed, moderate ([place]) 10/05/2018  . Chronic post-traumatic stress disorder (PTSD) 10/05/2018  . Social anxiety disorder 10/05/2018  . Bipolar depression ([place]) 05/05/2017  . BMI 45.0-49.9, adult ([place]) 05/05/2017    Past Surgical History:  Procedure Laterality Date  . CHOLECYSTECTOMY      Prior to Admission medications   Medication Sig Start Date End Date Taking? Authorizing Provider  diclofenac (VOLTAREN) 75 MG EC tablet  07/12/18   [provider]  divalproex (DEPAKOTE ER) 500 MG 24 hr tablet Take 2 tablets (1,000 mg total) by mouth at bedtime. 07/13/18   Ursula Alert, MD  hydrOXYzine (VISTARIL) 25 MG capsule Take 1 capsule (25 mg total) by mouth 3 (three) times daily as needed. 06/01/18   Ursula Alert, MD  lithium 300 MG tablet Take 1 tablet (300 mg total) by mouth daily with breakfast. 07/13/18   Ursula Alert, MD  medroxyPROGESTERone (DEPO-PROVERA) 150 MG/ML injection every 3 (three) months    [provider]  olanzapine  zydis (ZYPREXA ZYDIS) 15 MG disintegrating tablet Take 1 tablet (15 mg total) by mouth at bedtime. 10/05/18   Ursula Alert, MD    Allergies Levaquin [levofloxacin in d5w] and Penicillins  Family History  Problem Relation Age of Onset  . Bipolar disorder Mother   . Schizophrenia Maternal Aunt   . Bipolar disorder Paternal Grandmother   . Schizophrenia Paternal Grandmother     Social History Social History   Tobacco Use  . Smoking status: Never Smoker  . Smokeless tobacco: Never Used  Substance Use Topics  . Alcohol use: No  . Drug use: No    Review of Systems Constitutional: No fever/chills Cardiovascular: Denies chest pain. Respiratory: Denies shortness of breath. Gastrointestinal: No abdominal pain.  No nausea, no vomiting.  Positive rectal pain. Genitourinary: Negative for dysuria. Musculoskeletal: Negative for back pain. Skin: Negative for rash. Neurological: Negative for headaches, focal weakness or numbness. ___________________________________________   PHYSICAL EXAM:  VITAL SIGNS: ED Triage Vitals  Enc Vitals Group     BP 10/23/18 1706 135/88     Pulse Rate 10/23/18 1706 100     Resp 10/23/18 1706 18     Temp 10/23/18 1706 99 F (37.2 C)     Temp Source 10/23/18 1706 Oral     SpO2 10/23/18 1706 94 %     Weight 10/23/18 1706 280 lb (127 kg)     Height 10/23/18 1706 5\' 4"  (1.626 m)  Head Circumference --      Peak Flow --      Pain Score 10/23/18 1713 8     Pain Loc --      Pain Edu? --      Excl. in GC? --    Constitutional: Alert and oriented. Well appearing and in no acute distress.  Obese Eyes: Conjunctivae are normal.  Head: Atraumatic. Nose: No congestion/rhinnorhea. Neck: No stridor.   Cardiovascular: Normal rate, regular rhythm. Grossly normal heart sounds.  Good peripheral circulation. Respiratory: Normal respiratory effort.  No retractions. Lungs CTAB. Gastrointestinal: On rectal exam there is no external hemorrhoids or fissures seen.   There is a very small skin tag noted posterior portion.  There is some tenderness on palpation.  No melena. Musculoskeletal: Moves upper and lower extremities without any difficulty.  Normal gait was noted. Neurologic:  Normal speech and language. No gross focal neurologic deficits are appreciated. No gait instability. Skin:  Skin is warm, dry and intact. No rash noted. Psychiatric: Mood and affect are normal. Speech and behavior are normal.  ____________________________________________   LABS (all labs ordered are listed, but only abnormal results are displayed)  Labs Reviewed - No data to display  PROCEDURES  Procedure(s) performed (including Critical Care):  Procedures   ____________________________________________   INITIAL IMPRESSION / ASSESSMENT AND PLAN / ED COURSE  As part of my medical decision making, I reviewed the following data within the electronic MEDICAL RECORD NUMBER Notes from prior ED visits and [place] Controlled Substance Database  38 year old female presents to the ED with complaint of rectal pain for the last 4 days.  She denies any blood either on the stool, toilet or on the tissue after wiping.  She denies any history of constipation recently.  Patient does have a history of polyps 7 years ago and has not been to see her GI doctor since that time.  Currently she is using over-the-counter hemorrhoid cream without relief.  Physical exam shows a skin tag but no external hemorrhoids were seen.  No fissures.  There was tenderness on palpation but no abscess was noted.  Patient is to try sitz bath's.  There is also noted that approximately 7 years ago she was seen by Dr. Mechele CollinElliott and had a colonoscopy.  She was instructed to call and make an appointment for her rectal pain.  Return to the emergency department if any severe worsening of her symptoms.  ____________________________________________   FINAL CLINICAL IMPRESSION(S) / ED DIAGNOSES  Final diagnoses:  Rectal pain      ED Discharge Orders    None       Note:  This document was prepared using Dragon voice recognition software and may include unintentional dictation errors.    Tommi RumpsSummers,  L, PA-C 10/23/18 Nathanial Rancher1935    Jessup, Charles, MD 10/24/18 [phone]

## 2018-10-23 NOTE — ED Triage Notes (Signed)
Rectal pain x 4 days. Denies injury.

## 2018-10-26 ENCOUNTER — Encounter: Payer: Self-pay | Admitting: Psychiatry

## 2018-10-26 ENCOUNTER — Ambulatory Visit (INDEPENDENT_AMBULATORY_CARE_PROVIDER_SITE_OTHER): Payer: BLUE CROSS/BLUE SHIELD | Admitting: Psychiatry

## 2018-10-26 ENCOUNTER — Other Ambulatory Visit: Payer: Self-pay

## 2018-10-26 ENCOUNTER — Telehealth: Payer: Self-pay

## 2018-10-26 DIAGNOSIS — F4010 Social phobia, unspecified: Secondary | ICD-10-CM

## 2018-10-26 DIAGNOSIS — F3162 Bipolar disorder, current episode mixed, moderate: Secondary | ICD-10-CM | POA: Diagnosis not present

## 2018-10-26 DIAGNOSIS — Z79899 Other long term (current) drug therapy: Secondary | ICD-10-CM | POA: Diagnosis not present

## 2018-10-26 DIAGNOSIS — F4312 Post-traumatic stress disorder, chronic: Secondary | ICD-10-CM | POA: Diagnosis not present

## 2018-10-26 NOTE — Progress Notes (Signed)
Virtual Visit via Video Note  I connected with Pamela Rasmussen on 10/26/18 at 11:15 AM EDT by a video enabled telemedicine application and verified that I am speaking with the correct person using two identifiers.   I discussed the limitations of evaluation and management by telemedicine and the availability of in person appointments. The patient expressed understanding and agreed to proceed.   I discussed the assessment and treatment plan with the patient. The patient was provided an opportunity to ask questions and all were answered. The patient agreed with the plan and demonstrated an understanding of the instructions.   The patient was advised to call back or seek an in-person evaluation if the symptoms worsen or if the condition fails to improve as anticipated.   [place] MD OP Progress Note  10/26/2018 5:50 PM Joli Koob  MRN:  [id number]  Chief Complaint:  Chief Complaint    Follow-up     HPI: Pamela Rasmussen is a 38 year old Caucasian female, employed, single, lives in [place], has a history of bipolar disorder, PTSD, social anxiety disorder was evaluated by telemedicine today.  A video call was initiated however due to connection problem it had to be changed to a phone call.  Patient today reports she is currently doing well on the current medication regimen.  Patient denies any suicidality, homicidality or perceptual disturbances.  Patient reports she is compliant on medications and denies side effects.  She reports sleep is good.  She reports appetite is fair.  She continues to have good support system from her mother.  She reports work is going well, she is able to focus.  She denies any other concerns today. Visit Diagnosis:    ICD-10-CM   1. Bipolar 1 disorder, mixed, moderate (HCC)  F31.62 Lithium level    Valproic acid level    Basic metabolic panel    TSH  2. Chronic post-traumatic stress disorder (PTSD)  F43.12   3. Social anxiety disorder  F40.10    4. High risk medication use  Z79.899 Lithium level    Valproic acid level    Basic metabolic panel    TSH    Past Psychiatric History: I have reviewed past psychiatric history from my progress note on 01/04/2018.  Past trials of Celexa, Lexapro, olanzapine, lithium, Wellbutrin, Depakote, risperidone  Past Medical History:  Past Medical History:  Diagnosis Date  . Anxiety   . Bipolar 1 disorder ([place])   . Vertigo     Past Surgical History:  Procedure Laterality Date  . CHOLECYSTECTOMY      Family Psychiatric History: I have reviewed family psychiatric history from my progress note on 01/04/2018  Family History:  Family History  Problem Relation Age of Onset  . Bipolar disorder Mother   . Schizophrenia Maternal Aunt   . Bipolar disorder Paternal Grandmother   . Schizophrenia Paternal Grandmother     Social History: I have reviewed social history from my progress note on 01/04/2018 Social History   Socioeconomic History  . Marital status: Significant Other    Spouse name: Not on file  . Number of children: 58  . Years of education: Not on file  . Highest education level: Associate degree: occupational, Hotel manager, or vocational program  Occupational History    Comment: not employed  Social Needs  . Financial resource strain: Not hard at all  . Food insecurity    Worry: Never true    Inability: Never true  . Transportation needs    Medical: Yes  Non-medical: Yes  Tobacco Use  . Smoking status: Never Smoker  . Smokeless tobacco: Never Used  Substance and Sexual Activity  . Alcohol use: No  . Drug use: No  . Sexual activity: Yes  Lifestyle  . Physical activity    Days per week: 0 days    Minutes per session: 0 min  . Stress: Rather much  Relationships  . Social Musician on phone: Not on file    Gets together: Not on file    Attends religious service: 1 to 4 times per year    Active member of club or organization: No    Attends meetings of clubs  or organizations: Never    Relationship status: Never married  Other Topics Concern  . Not on file  Social History Narrative  . Not on file    Allergies:  Allergies  Allergen Reactions  . Levaquin [Levofloxacin In D5w] Itching  . Penicillins Rash    Has patient had a PCN reaction causing immediate rash, facial/tongue/throat swelling, SOB or lightheadedness with hypotension: No Has patient had a PCN reaction causing severe rash involving mucus membranes or skin necrosis: No Has patient had a PCN reaction that required hospitalization: No Has patient had a PCN reaction occurring within the last 10 years: No If all of the above answers are "NO", then may proceed with Cephalosporin use.     Metabolic Disorder Labs: No results found for: HGBA1C, MPG Lab Results  Component Value Date   PROLACTIN 6.9 01/11/2018   No results found for: CHOL, TRIG, HDL, CHOLHDL, VLDL, LDLCALC No results found for: TSH  Therapeutic Level Labs: Lab Results  Component Value Date   LITHIUM 0.3 (L) 07/23/2018   LITHIUM 0.1 (L) 02/01/2018   Lab Results  Component Value Date   VALPROATE 41 (L) 07/23/2018   VALPROATE 58 02/01/2018   No components found for:  CBMZ  Current Medications: Current Outpatient Medications  Medication Sig Dispense Refill  . diclofenac (VOLTAREN) 75 MG EC tablet     . divalproex (DEPAKOTE ER) 500 MG 24 hr tablet Take 2 tablets (1,000 mg total) by mouth at bedtime. 180 tablet 1  . hydrOXYzine (VISTARIL) 25 MG capsule Take 1 capsule (25 mg total) by mouth 3 (three) times daily as needed. 90 capsule 1  . lithium 300 MG tablet Take 1 tablet (300 mg total) by mouth daily with breakfast. 90 tablet 1  . medroxyPROGESTERone (DEPO-PROVERA) 150 MG/ML injection every 3 (three) months    . olanzapine zydis (ZYPREXA ZYDIS) 15 MG disintegrating tablet Take 1 tablet (15 mg total) by mouth at bedtime. 30 tablet 1   No current facility-administered medications for this visit.       Musculoskeletal: Strength & Muscle Tone: UTA Gait & Station: normal Patient leans: N/A  Psychiatric Specialty Exam: Review of Systems  Psychiatric/Behavioral: Negative for hallucinations, substance abuse and suicidal ideas. The patient is not nervous/anxious and does not have insomnia.   All other systems reviewed and are negative.   Last menstrual period 09/26/2018.There is no height or weight on file to calculate BMI.  General Appearance: Casual  Eye Contact:  Fair  Speech:  Clear and Coherent  Volume:  Normal  Mood:  Euthymic  Affect:  Appropriate  Thought Process:  Goal Directed and Descriptions of Associations: Intact  Orientation:  Full (Time, Place, and Person)  Thought Content: Logical   Suicidal Thoughts:  No  Homicidal Thoughts:  No  Memory:  Immediate;  Fair Recent;   Fair Remote;   Fair  Judgement:  Good  Insight:  Fair  Psychomotor Activity:  Normal  Concentration:  Concentration: Fair and Attention Span: Fair  Recall:  [employer] of Knowledge: Fair  Language: Fair  Akathisia:  No  Handed:  Right  AIMS (if indicated): Denies tremors, rigidity  Assets:  Communication Skills Desire for Improvement Housing Social Support  ADL's:  Intact  Cognition: WNL  Sleep:  Fair   Screenings:   Assessment and Plan: Pamela Rasmussen is a 38 year old Caucasian female, single, lives in [place] with her mother, has a history of bipolar disorder, PTSD, social anxiety disorder was evaluated by telemedicine today.  Patient is biologically predisposed given her history of trauma, family history of mental health problems.  Patient is currently making progress on the current medication regimen.  Plan as noted below.  Plan Bipolar disorder-stable Lithium 300 mg p.o. daily with breakfast Lithium level-subtherapeutic on 07/23/2018-0.3 Depakote 1000 mg p.o. daily Depakote-subtherapeutic-41 on 07/23/2018. Zyprexa 15 mg p.o. nightly  We will order the following labs-Depakote  level, lithium level, BMP, TSH.  She will go to nearest [facility].  PTSD-stable We will monitor closely.  Anxiety disorder-stable Hydroxyzine 25 mg p.o. twice daily PRN  Follow-up in clinic in 2 to 3 months or sooner if needed.  December 3 at 10:30 AM  I have spent atleast 25 minutes non face to face with patient today. More than 50 % of the time was spent for psychoeducation and supportive psychotherapy and care coordination. This note was generated in part or whole with voice recognition software. Voice recognition is usually quite accurate but there are transcription errors that can and very often do occur. I apologize for any typographical errors that were not detected and corrected.       Jomarie Longs, MD 10/26/2018, 5:50 PM

## 2018-10-26 NOTE — Telephone Encounter (Signed)
labwork order mailed out 

## 2018-11-06 LAB — VALPROIC ACID LEVEL: Valproic Acid Lvl: 67 ug/mL (ref 50–100)

## 2018-11-06 LAB — TSH: TSH: 2.53 u[IU]/mL (ref 0.450–4.500)

## 2018-11-22 ENCOUNTER — Encounter: Payer: Self-pay | Admitting: Psychiatry

## 2018-11-22 ENCOUNTER — Other Ambulatory Visit: Payer: Self-pay

## 2018-11-22 ENCOUNTER — Ambulatory Visit (INDEPENDENT_AMBULATORY_CARE_PROVIDER_SITE_OTHER): Payer: BLUE CROSS/BLUE SHIELD | Admitting: Psychiatry

## 2018-11-22 DIAGNOSIS — F4010 Social phobia, unspecified: Secondary | ICD-10-CM

## 2018-11-22 DIAGNOSIS — F3162 Bipolar disorder, current episode mixed, moderate: Secondary | ICD-10-CM

## 2018-11-22 DIAGNOSIS — F4312 Post-traumatic stress disorder, chronic: Secondary | ICD-10-CM | POA: Diagnosis not present

## 2018-11-22 MED ORDER — BUPROPION HCL 75 MG PO TABS: 75.0000 mg | ORAL_TABLET | Freq: Every morning | ORAL | 1 refills | Status: DC

## 2018-11-22 NOTE — Progress Notes (Signed)
Virtual Visit via Video Note  I connected with Pamela Rasmussen on 11/22/18 at 10:20 AM EDT by a video enabled telemedicine application and verified that I am speaking with the correct person using two identifiers.   I discussed the limitations of evaluation and management by telemedicine and the availability of in person appointments. The patient expressed understanding and agreed to proceed.   I discussed the assessment and treatment plan with the patient. The patient was provided an opportunity to ask questions and all were answered. The patient agreed with the plan and demonstrated an understanding of the instructions.   The patient was advised to call back or seek an in-person evaluation if the symptoms worsen or if the condition fails to improve as anticipated.  [facility] MD OP Progress Note  11/22/2018 12:30 PM [name]  MRN:  [id number]  Chief Complaint:  Chief Complaint    Follow-up     HPI: Pamela Rasmussen is a 38 year old Caucasian female, employed, single, lives in [place], has a history of bipolar disorder, PTSD, social anxiety disorder was evaluated by telemedicine today.  A video call was initiated however due to connection problem it had to be changed to a phone call.   Patient today reports that she has been struggling with depression since the past several weeks.  She reports she lost her job and that may have triggered it.  She reports she may have not been working even when she came to [employer] last visit however was not truthful about it.  She reports she struggles with sadness, lack of motivation, anhedonia as well as passive suicidal thoughts.  Patient reports she had suicidal thoughts this morning however she was able to distract herself.  She denies any plan.  Patient reports sleep is good.  She is compliant on her medications.  Collateral information was obtained from her mother-Pamela Rasmussen with whom patient lives.  Per mother patient needs help with getting  her out of bed every day, prompting her to eat, prompting her to take a shower and so on.  Mother believes she has been getting more and more depressed the past few weeks.  She has been taking her medications as prescribed.  Mother is aware that patient does have some suicidality and is agreeable to take her to the nearest emergency department if her suicidal ideation returns or her depressive symptoms get worse.  Per mother she is home with her all day and night and will make sure she supports her and supervise her and monitor her for suicidality.    Visit Diagnosis:    ICD-10-CM   1. Bipolar 1 disorder, mixed, moderate (HCC)  F31.62 buPROPion (WELLBUTRIN) 75 MG tablet  2. Chronic post-traumatic stress disorder (PTSD)  F43.12   3. Social anxiety disorder  F40.10     Past Psychiatric History: I have reviewed past psychiatric history from my progress note on 01/04/2018.  Past trials of Celexa, Lexapro, olanzapine, lithium, Wellbutrin, Depakote, risperidone.  Past Medical History:  Past Medical History:  Diagnosis Date  . Anxiety   . Bipolar 1 disorder (HCC)   . Vertigo     Past Surgical History:  Procedure Laterality Date  . CHOLECYSTECTOMY      Family Psychiatric History: I have reviewed family psychiatric history from my progress note on 01/04/2018.  Family History:  Family History  Problem Relation Age of Onset  . Bipolar disorder Mother   . Schizophrenia Maternal Aunt   . Bipolar disorder Paternal Grandmother   . Schizophrenia  Paternal Grandmother     Social History: I have reviewed social history from my progress note on 01/04/2018. Social History   Socioeconomic History  . Marital status: Significant Other    Spouse name: Not on file  . Number of children: 16  . Years of education: Not on file  . Highest education level: Associate degree: occupational, Hotel manager, or vocational program  Occupational History    Comment: not employed  Social Needs  . Financial resource  strain: Not hard at all  . Food insecurity    Worry: Never true    Inability: Never true  . Transportation needs    Medical: Yes    Non-medical: Yes  Tobacco Use  . Smoking status: Never Smoker  . Smokeless tobacco: Never Used  Substance and Sexual Activity  . Alcohol use: No  . Drug use: No  . Sexual activity: Yes  Lifestyle  . Physical activity    Days per week: 0 days    Minutes per session: 0 min  . Stress: Rather much  Relationships  . Social Herbalist on phone: Not on file    Gets together: Not on file    Attends religious service: 1 to 4 times per year    Active member of club or organization: No    Attends meetings of clubs or organizations: Never    Relationship status: Never married  Other Topics Concern  . Not on file  Social History Narrative  . Not on file    Allergies:  Allergies  Allergen Reactions  . Levaquin [Levofloxacin In D5w] Itching  . Penicillins Rash    Has patient had a PCN reaction causing immediate rash, facial/tongue/throat swelling, SOB or lightheadedness with hypotension: No Has patient had a PCN reaction causing severe rash involving mucus membranes or skin necrosis: No Has patient had a PCN reaction that required hospitalization: No Has patient had a PCN reaction occurring within the last 10 years: No If all of the above answers are "NO", then may proceed with Cephalosporin use.     Metabolic Disorder Labs: No results found for: HGBA1C, MPG Lab Results  Component Value Date   PROLACTIN 6.9 01/11/2018   No results found for: CHOL, TRIG, HDL, CHOLHDL, VLDL, LDLCALC Lab Results  Component Value Date   TSH 2.530 11/05/2018    Therapeutic Level Labs: Lab Results  Component Value Date   LITHIUM 0.3 (L) 07/23/2018   LITHIUM 0.1 (L) 02/01/2018   Lab Results  Component Value Date   VALPROATE 67 11/05/2018   VALPROATE 41 (L) 07/23/2018   No components found for:  CBMZ  Current Medications: Current Outpatient  Medications  Medication Sig Dispense Refill  . buPROPion (WELLBUTRIN) 75 MG tablet Take 1 tablet (75 mg total) by mouth every morning. FOR DEPRESSION 30 tablet 1  . diclofenac (VOLTAREN) 75 MG EC tablet     . divalproex (DEPAKOTE ER) 500 MG 24 hr tablet Take 2 tablets (1,000 mg total) by mouth at bedtime. 180 tablet 1  . hydrOXYzine (VISTARIL) 25 MG capsule Take 1 capsule (25 mg total) by mouth 3 (three) times daily as needed. 90 capsule 1  . lithium 300 MG tablet Take 1 tablet (300 mg total) by mouth daily with breakfast. 90 tablet 1  . medroxyPROGESTERone (DEPO-PROVERA) 150 MG/ML injection every 3 (three) months    . olanzapine zydis (ZYPREXA ZYDIS) 15 MG disintegrating tablet Take 1 tablet (15 mg total) by mouth at bedtime. 30 tablet 1  No current facility-administered medications for this visit.      Musculoskeletal: Strength & Muscle Tone: UTA Gait & Station: Reports as WNL Patient leans: N/A  Psychiatric Specialty Exam: Review of Systems  Psychiatric/Behavioral: Positive for depression.  All other systems reviewed and are negative.   There were no vitals taken for this visit.There is no height or weight on file to calculate BMI.  General Appearance: UTA  Eye Contact:  UTA  Speech:  Clear and Coherent  Volume:  Normal  Mood:  Depressed  Affect:  UTA  Thought Process:  Goal Directed and Descriptions of Associations: Intact  Orientation:  Full (Time, Place, and Person)  Thought Content: Logical   Suicidal Thoughts:  No  Homicidal Thoughts:  No  Memory:  Immediate;   Fair Recent;   Fair Remote;   Fair  Judgement:  Fair  Insight:  Fair  Psychomotor Activity:  UTA  Concentration:  Concentration: Fair and Attention Span: Fair  Recall:  [employer]  Fund of Knowledge: Fair  Language: Fair  Akathisia:  No  Handed:  Right  AIMS (if indicated): Denies tremors, rigidity  Assets:  Communication Skills Desire for Improvement Housing Social Support  ADL's:  Intact  Cognition:  WNL  Sleep:  Fair   Screenings:   Assessment and Plan: Pamela Rasmussen is a 38 year old Caucasian female, lives in [place] with her mother, has a history of bipolar disorder, PTSD, social anxiety disorder was evaluated by telemedicine today.  Patient is biologically predisposed given her history of trauma, family history of mental health problems.  Patient continues to struggle with depressive symptoms and will benefit from medication readjustment. Patient has the following risk factors for suicide-chronic mental health problems currently not stable on medications, recent suicidal thoughts, history of trauma, being single, unemployed, does have a family history of suicide. She does have the following protective factors-she lives with her mother who is supportive, she is motivated to stay on treatment and is compliant with medications, denies suicide attempts, denies substance abuse problems. Acute risk for suicide is low.  Plan Bipolar disorder-unstable Lithium 300 mg p.o. daily with breakfast Lithium level-subtherapeutic on 07/23/2018-0.3 Depakote 1000 mg p.o. daily Depakote- therapeutic-67- 11/05/2018 Zyprexa 15 mg p.o. nightly Start Wellbutrin 75 mg p.o. daily  PTSD-stable We will monitor closely  Anxiety disorder-stable Hydroxyzine 25 mg p.o. twice daily as needed  Reviewed the following labs dated 11/05/2018-TSH-2.53-within normal limits, valproic acid-67-therapeutic.  Pending labs-lithium level, BMP-will order again.  Will mail it to her mailing address again.  Collateral information was obtained from mother- Neta MendsMarci -who agrees to take her to the nearest emergency department or call 911 if her symptoms worsen.  Patient as well as mother wants to wait and get these medication changes a trial before going to the nearest emergency department.  Follow-up in clinic in 1 week or sooner if needed.  October 30 at 8:45 AM  I have spent atleast 25 minutes non face to face with patient today. More  than 50 % of the time was spent for psychoeducation and supportive psychotherapy and care coordination. This note was generated in part or whole with voice recognition software. Voice recognition is usually quite accurate but there are transcription errors that can and very often do occur. I apologize for any typographical errors that were not detected and corrected.       Jomarie LongsSaramma Constance Whittle, MD 11/22/2018, 12:30 PM

## 2018-12-03 ENCOUNTER — Other Ambulatory Visit: Payer: Self-pay

## 2018-12-03 ENCOUNTER — Ambulatory Visit (INDEPENDENT_AMBULATORY_CARE_PROVIDER_SITE_OTHER): Payer: BLUE CROSS/BLUE SHIELD | Admitting: Psychiatry

## 2018-12-03 ENCOUNTER — Other Ambulatory Visit: Payer: Self-pay | Admitting: Psychiatry

## 2018-12-03 ENCOUNTER — Encounter: Payer: Self-pay | Admitting: Psychiatry

## 2018-12-03 ENCOUNTER — Telehealth: Payer: Self-pay

## 2018-12-03 DIAGNOSIS — F3162 Bipolar disorder, current episode mixed, moderate: Secondary | ICD-10-CM

## 2018-12-03 DIAGNOSIS — F4312 Post-traumatic stress disorder, chronic: Secondary | ICD-10-CM | POA: Diagnosis not present

## 2018-12-03 DIAGNOSIS — F4010 Social phobia, unspecified: Secondary | ICD-10-CM

## 2018-12-03 MED ORDER — OLANZAPINE 15 MG PO TBDP: 15.0000 mg | ORAL_TABLET | Freq: Every day | ORAL | 1 refills | Status: DC

## 2018-12-03 MED ORDER — BUPROPION HCL 100 MG PO TABS: 100.0000 mg | ORAL_TABLET | Freq: Every day | ORAL | 1 refills | Status: DC

## 2018-12-03 NOTE — Telephone Encounter (Signed)
spoke with patient to confirmed her address to mail labwork orders too and she states that she received in todays mail.

## 2018-12-03 NOTE — Progress Notes (Signed)
Virtual Visit via Telephone Note  I connected with Pamela Rasmussen on 12/03/18 at  8:30 AM EDT by telephone and verified that I am speaking with the correct person using two identifiers.   I discussed the limitations, risks, security and privacy concerns of performing an evaluation and management service by telephone and the availability of in person appointments. I also discussed with the patient that there may be a patient responsible charge related to this service. The patient expressed understanding and agreed to proceed.   I discussed the assessment and treatment plan with the patient. The patient was provided an opportunity to ask questions and all were answered. The patient agreed with the plan and demonstrated an understanding of the instructions.   The patient was advised to call back or seek an in-person evaluation if the symptoms worsen or if the condition fails to improve as anticipated.  [facility] MD OP Progress Note  12/03/2018 10:22 AM Pamela Rasmussen  MRN:  [id number]  Chief Complaint:  Chief Complaint    Follow-up     HPI: Pamela Rasmussen is a 38 year old Caucasian female, employed, single, lives in [place], has a history of bipolar disorder, PTSD, social anxiety disorder was evaluated by phone today.  Patient reported to a phone call.  Patient today reports she has noticed improvement in her depressive symptoms.  She reports that Wellbutrin is effective.  She however is interested in dosage increase.  She denies any side effects to the Wellbutrin at this time.  Patient reports she currently does not have any suicidal thoughts.  She denies any homicidality or perceptual disturbances.  She reports sleep is okay.  She continues to take Zyprexa which helps with her mood as well as sleep.  Patient reports she has not been able to get the lithium level as well as BMP which were ordered last visit-2 weeks ago.  Encourage patient to do so.  Discussed with her that lab slip can be  mailed to her again today.  She reports her mother continues to be supportive.  Patient denies any other concerns today. Visit Diagnosis:    ICD-10-CM   1. Bipolar 1 disorder, mixed, moderate (HCC)  F31.62 buPROPion (WELLBUTRIN) 100 MG tablet    olanzapine zydis (ZYPREXA ZYDIS) 15 MG disintegrating tablet  2. Chronic post-traumatic stress disorder (PTSD)  F43.12   3. Social anxiety disorder  F40.10     Past Psychiatric History: Reviewed past psychiatric history from my progress note on 01/04/2018.  Past trials of Celexa, Lexapro, olanzapine, and then, Wellbutrin, Depakote, risperidone  Past Medical History:  Past Medical History:  Diagnosis Date  . Anxiety   . Bipolar 1 disorder ([place])   . Vertigo     Past Surgical History:  Procedure Laterality Date  . CHOLECYSTECTOMY      Family Psychiatric History: Reviewed family psychiatric history from my progress note on 01/04/2018.  Family History:  Family History  Problem Relation Age of Onset  . Bipolar disorder Mother   . Schizophrenia Maternal Aunt   . Bipolar disorder Paternal Grandmother   . Schizophrenia Paternal Grandmother     Social History: Reviewed social history from my progress note on 01/04/2018. Social History   Socioeconomic History  . Marital status: Significant Other    Spouse name: Not on file  . Number of children: 70  . Years of education: Not on file  . Highest education level: Associate degree: occupational, Hotel manager, or vocational program  Occupational History    Comment: not employed  Social Needs  . Financial resource strain: Not hard at all  . Food insecurity    Worry: Never true    Inability: Never true  . Transportation needs    Medical: Yes    Non-medical: Yes  Tobacco Use  . Smoking status: Never Smoker  . Smokeless tobacco: Never Used  Substance and Sexual Activity  . Alcohol use: No  . Drug use: No  . Sexual activity: Yes  Lifestyle  . Physical activity    Days per week: 0 days     Minutes per session: 0 min  . Stress: Rather much  Relationships  . Social Musician on phone: Not on file    Gets together: Not on file    Attends religious service: 1 to 4 times per year    Active member of club or organization: No    Attends meetings of clubs or organizations: Never    Relationship status: Never married  Other Topics Concern  . Not on file  Social History Narrative  . Not on file    Allergies:  Allergies  Allergen Reactions  . Levaquin [Levofloxacin In D5w] Itching  . Penicillins Rash    Has patient had a PCN reaction causing immediate rash, facial/tongue/throat swelling, SOB or lightheadedness with hypotension: No Has patient had a PCN reaction causing severe rash involving mucus membranes or skin necrosis: No Has patient had a PCN reaction that required hospitalization: No Has patient had a PCN reaction occurring within the last 10 years: No If all of the above answers are "NO", then may proceed with Cephalosporin use.     Metabolic Disorder Labs: No results found for: HGBA1C, MPG Lab Results  Component Value Date   PROLACTIN 6.9 01/11/2018   No results found for: CHOL, TRIG, HDL, CHOLHDL, VLDL, LDLCALC Lab Results  Component Value Date   TSH 2.530 11/05/2018    Therapeutic Level Labs: Lab Results  Component Value Date   LITHIUM 0.3 (L) 07/23/2018   LITHIUM 0.1 (L) 02/01/2018   Lab Results  Component Value Date   VALPROATE 67 11/05/2018   VALPROATE 41 (L) 07/23/2018   No components found for:  CBMZ  Current Medications: Current Outpatient Medications  Medication Sig Dispense Refill  . buPROPion (WELLBUTRIN) 100 MG tablet Take 1 tablet (100 mg total) by mouth daily with breakfast. mood 30 tablet 1  . diclofenac (VOLTAREN) 75 MG EC tablet     . divalproex (DEPAKOTE ER) 500 MG 24 hr tablet Take 2 tablets (1,000 mg total) by mouth at bedtime. 180 tablet 1  . hydrOXYzine (VISTARIL) 25 MG capsule Take 1 capsule (25 mg total) by  mouth 3 (three) times daily as needed. 90 capsule 1  . lithium 300 MG tablet Take 1 tablet (300 mg total) by mouth daily with breakfast. 90 tablet 1  . medroxyPROGESTERone (DEPO-PROVERA) 150 MG/ML injection every 3 (three) months    . olanzapine zydis (ZYPREXA ZYDIS) 15 MG disintegrating tablet Take 1 tablet (15 mg total) by mouth at bedtime. 30 tablet 1   No current facility-administered medications for this visit.      Musculoskeletal: Strength & Muscle Tone: UTA Gait & Station: Reports as WNL Patient leans: N/A  Psychiatric Specialty Exam: Review of Systems  Psychiatric/Behavioral: Positive for depression.  All other systems reviewed and are negative.   There were no vitals taken for this visit.There is no height or weight on file to calculate BMI.  General Appearance: UTA  Eye Contact:  UTA  Speech:  Clear and Coherent  Volume:  Normal  Mood:  Depressed  Affect:  UTA  Thought Process:  Goal Directed and Descriptions of Associations: Intact  Orientation:  Full (Time, Place, and Person)  Thought Content: Logical   Suicidal Thoughts:  No  Homicidal Thoughts:  No  Memory:  Immediate;   Fair Recent;   Fair Remote;   Fair  Judgement:  Fair  Insight:  Fair  Psychomotor Activity:  UTA  Concentration:  Concentration: Fair and Attention Span: Fair  Recall:  [employer]  Fund of Knowledge: Fair  Language: Fair  Akathisia:  No  Handed:  Right  AIMS (if indicated): Denies tremors, rigidity  Assets:  Communication Skills Desire for Improvement Housing Social Support  ADL's:  Intact  Cognition: WNL  Sleep:  Fair   Screenings:   Assessment and Plan: Zella BallRobin is a 38 year old Caucasian female, lives in [place] with her mother, has a history of bipolar disorder, PTSD, social anxiety disorder was evaluated by phone today.  She is biologically predisposed given her history of trauma, family history of mental health problems.  She continues to struggle with depressive symptoms although  progressing on the current medication regimen.  We will continue to make medication readjustment.  Plan Bipolar disorder-improving Lithium 300 mg p.o. daily with breakfast Lithium level-subtherapeutic on 07/23/2018-0.3 Depakote 1000 mg p.o. daily Depakote-therapeutic-67 on 11/05/2018 Zyprexa 15 mg p.o. nightly Increase Wellbutrin to 100 mg p.o. daily Pending labs-lithium level and BMP-which we have mailed to patient last visit.  Patient however reports she has not received it yet.  Will mail it again today.  PTSD-stable We will continue to monitor closely  Anxiety disorder-stable Hydroxyzine 25 mg p.o. twice daily as needed  Follow-up in clinic in 3 to 4 weeks or sooner if needed.  November 19 at 3:40 PM  I have spent atleast 15 minutes non  face to face with patient today. More than 50 % of the time was spent for psychoeducation and supportive psychotherapy and care coordination. This note was generated in part or whole with voice recognition software. Voice recognition is usually quite accurate but there are transcription errors that can and very often do occur. I apologize for any typographical errors that were not detected and corrected.       Jomarie LongsSaramma Noeli Lavery, MD 12/03/2018, 10:22 AM

## 2018-12-08 ENCOUNTER — Other Ambulatory Visit: Payer: Self-pay | Admitting: Psychiatry

## 2018-12-09 LAB — BASIC METABOLIC PANEL
BUN/Creatinine Ratio: 13 (ref 9–23)
BUN: 12 mg/dL (ref 6–20)
CO2: 20 mmol/L (ref 20–29)
Calcium: 9.7 mg/dL (ref 8.7–10.2)
Chloride: 102 mmol/L (ref 96–106)
Creatinine, Ser: 0.89 mg/dL (ref 0.57–1.00)
GFR calc Af Amer: 96 mL/min/{1.73_m2} (ref >59)
GFR calc non Af Amer: 83 mL/min/{1.73_m2} (ref >59)
Glucose: 138 mg/dL — ABNORMAL HIGH (ref 65–99)
Potassium: 4.2 mmol/L (ref 3.5–5.2)
Sodium: 139 mmol/L (ref 134–144)

## 2018-12-09 LAB — LITHIUM LEVEL: Lithium Lvl: 0.2 mmol/L — ABNORMAL LOW (ref 0.6–1.2)

## 2018-12-23 ENCOUNTER — Ambulatory Visit (INDEPENDENT_AMBULATORY_CARE_PROVIDER_SITE_OTHER): Payer: BLUE CROSS/BLUE SHIELD | Admitting: Psychiatry

## 2018-12-23 ENCOUNTER — Encounter: Payer: Self-pay | Admitting: Psychiatry

## 2018-12-23 ENCOUNTER — Other Ambulatory Visit: Payer: Self-pay

## 2018-12-23 DIAGNOSIS — F4312 Post-traumatic stress disorder, chronic: Secondary | ICD-10-CM

## 2018-12-23 DIAGNOSIS — F4010 Social phobia, unspecified: Secondary | ICD-10-CM | POA: Diagnosis not present

## 2018-12-23 DIAGNOSIS — F3177 Bipolar disorder, in partial remission, most recent episode mixed: Secondary | ICD-10-CM | POA: Diagnosis not present

## 2018-12-23 MED ORDER — LITHIUM CARBONATE 300 MG PO TABS: 300.0000 mg | ORAL_TABLET | Freq: Every day | ORAL | 1 refills | Status: DC

## 2018-12-23 MED ORDER — DIVALPROEX SODIUM ER 500 MG PO TB24: 1000.0000 mg | ORAL_TABLET | Freq: Every day | ORAL | 1 refills | Status: DC

## 2018-12-23 NOTE — Progress Notes (Signed)
Virtual Visit via Telephone Note  I connected with Pamela Rasmussen on 12/23/18 at  3:40 PM EST by telephone and verified that I am speaking with the correct person using two identifiers.   I discussed the limitations, risks, security and privacy concerns of performing an evaluation and management service by telephone and the availability of in person appointments. I also discussed with the patient that there may be a patient responsible charge related to this service. The patient expressed understanding and agreed to proceed.     I discussed the assessment and treatment plan with the patient. The patient was provided an opportunity to ask questions and all were answered. The patient agreed with the plan and demonstrated an understanding of the instructions.   The patient was advised to call back or seek an in-person evaluation if the symptoms worsen or if the condition fails to improve as anticipated.  [facility] MD OP Progress Note  12/23/2018 4:59 PM Pamela Rasmussen  MRN:  [id number]  Chief Complaint:  Chief Complaint    Follow-up     HPI: Pamela Rasmussen is a 38 year old Caucasian female, employed, single, lives in [place], has a history of bipolar disorder, PTSD, social anxiety disorder was evaluated by phone today.  Patient preferred to do a phone call.  Patient today reports she has noticed improvement in her depressive symptoms.  She reports she is doing well on the current combination of medications.  She denies any significant mood lability.  She reports sleep is good.  She reports her anxiety symptoms is under control.  Patient denies any suicidality, homicidality or perceptual disturbances.  She reports she is compliant on medications and denies side effects.  Patient reports she has upcoming appointment for an interview with [employer] tomorrow.  She is hoping she will get this job.  She continues to have good support system from her mother.  Patient denies any other  concerns today. Visit Diagnosis:    ICD-10-CM   1. Bipolar disorder, in partial remission, most recent episode mixed (HCC)  F31.77 lithium 300 MG tablet   improving  2. Chronic post-traumatic stress disorder (PTSD)  F43.12 divalproex (DEPAKOTE ER) 500 MG 24 hr tablet  3. Social anxiety disorder  F40.10 divalproex (DEPAKOTE ER) 500 MG 24 hr tablet    Past Psychiatric History: I have reviewed past psychiatric history from my progress note on 01/04/2018.  Past trials of Celexa, Lexapro, olanzapine, Wellbutrin, Depakote, risperidone.  Past Medical History:  Past Medical History:  Diagnosis Date  . Anxiety   . Bipolar 1 disorder ([place])   . Vertigo     Past Surgical History:  Procedure Laterality Date  . CHOLECYSTECTOMY      Family Psychiatric History: Reviewed family psychiatric history from my progress note on 01/04/2018.  Family History:  Family History  Problem Relation Age of Onset  . Bipolar disorder Mother   . Schizophrenia Maternal Aunt   . Bipolar disorder Paternal Grandmother   . Schizophrenia Paternal Grandmother     Social History: Reviewed social history from my progress note on 01/04/2018. Social History   Socioeconomic History  . Marital status: Significant Other    Spouse name: Not on file  . Number of children: 90  . Years of education: Not on file  . Highest education level: Associate degree: occupational, Hotel manager, or vocational program  Occupational History    Comment: not employed  Social Needs  . Financial resource strain: Not hard at all  . Food insecurity  Worry: Never true    Inability: Never true  . Transportation needs    Medical: Yes    Non-medical: Yes  Tobacco Use  . Smoking status: Never Smoker  . Smokeless tobacco: Never Used  Substance and Sexual Activity  . Alcohol use: No  . Drug use: No  . Sexual activity: Yes  Lifestyle  . Physical activity    Days per week: 0 days    Minutes per session: 0 min  . Stress: Rather much   Relationships  . Social Musician on phone: Not on file    Gets together: Not on file    Attends religious service: 1 to 4 times per year    Active member of club or organization: No    Attends meetings of clubs or organizations: Never    Relationship status: Never married  Other Topics Concern  . Not on file  Social History Narrative  . Not on file    Allergies:  Allergies  Allergen Reactions  . Levaquin [Levofloxacin In D5w] Itching  . Penicillins Rash    Has patient had a PCN reaction causing immediate rash, facial/tongue/throat swelling, SOB or lightheadedness with hypotension: No Has patient had a PCN reaction causing severe rash involving mucus membranes or skin necrosis: No Has patient had a PCN reaction that required hospitalization: No Has patient had a PCN reaction occurring within the last 10 years: No If all of the above answers are "NO", then may proceed with Cephalosporin use.     Metabolic Disorder Labs: No results found for: HGBA1C, MPG Lab Results  Component Value Date   PROLACTIN 6.9 01/11/2018   No results found for: CHOL, TRIG, HDL, CHOLHDL, VLDL, LDLCALC Lab Results  Component Value Date   TSH 2.530 11/05/2018    Therapeutic Level Labs: Lab Results  Component Value Date   LITHIUM 0.2 (L) 12/08/2018   LITHIUM 0.3 (L) 07/23/2018   Lab Results  Component Value Date   VALPROATE 67 11/05/2018   VALPROATE 41 (L) 07/23/2018   No components found for:  CBMZ  Current Medications: Current Outpatient Medications  Medication Sig Dispense Refill  . buPROPion (WELLBUTRIN) 100 MG tablet Take 1 tablet (100 mg total) by mouth daily with breakfast. mood 30 tablet 1  . diclofenac (VOLTAREN) 75 MG EC tablet     . divalproex (DEPAKOTE ER) 500 MG 24 hr tablet Take 2 tablets (1,000 mg total) by mouth at bedtime. 180 tablet 1  . hydrOXYzine (VISTARIL) 25 MG capsule Take 1 capsule (25 mg total) by mouth 3 (three) times daily as needed. 90 capsule 1   . lithium 300 MG tablet Take 1 tablet (300 mg total) by mouth daily with breakfast. 90 tablet 1  . medroxyPROGESTERone (DEPO-PROVERA) 150 MG/ML injection every 3 (three) months    . olanzapine zydis (ZYPREXA ZYDIS) 15 MG disintegrating tablet Take 1 tablet (15 mg total) by mouth at bedtime. 30 tablet 1   No current facility-administered medications for this visit.      Musculoskeletal: Strength & Muscle Tone: UTA Gait & Station: Reports as WNL Patient leans: N/A  Psychiatric Specialty Exam: Review of Systems  Psychiatric/Behavioral: Negative for depression, hallucinations, substance abuse and suicidal ideas. The patient is not nervous/anxious and does not have insomnia.   All other systems reviewed and are negative.   There were no vitals taken for this visit.There is no height or weight on file to calculate BMI.  General Appearance: UTA  Eye Contact:  UTA  Speech:  Clear and Coherent  Volume:  Normal  Mood:  Euthymic  Affect:  UTA  Thought Process:  Goal Directed and Descriptions of Associations: Intact  Orientation:  Full (Time, Place, and Person)  Thought Content: Logical   Suicidal Thoughts:  No  Homicidal Thoughts:  No  Memory:  Immediate;   Fair Recent;   Fair Remote;   Fair  Judgement:  Fair  Insight:  Fair  Psychomotor Activity:  UTA  Concentration:  Concentration: Fair and Attention Span: Fair  Recall:  [employer]  Fund of Knowledge: Fair  Language: Fair  Akathisia:  No  Handed:  Right  AIMS (if indicated):Denies tremors, rigidity  Assets:  Communication Skills Desire for Improvement Housing Social Support  ADL's:  Intact  Cognition: WNL  Sleep:  Fair   Screenings:   Assessment and Plan: Pamela Rasmussen is a 38 year old Caucasian female, lives in [place] with her mother, has a history of bipolar disorder, PTSD, social anxiety disorder was evaluated by phone today.  Patient is biologically predisposed given her history of trauma, family history of mental health  problems.  Patient with psychosocial stressors of unemployment, the current pandemic is currently doing well on current medication regimen.  Plan as noted below.  Plan Bipolar disorder-in partial remission Lithium 300 mg p.o. daily with breakfast Reviewed and discussed lithium labs-dated 12/08/2018-0.2-subtherapeutic.  However will not make any readjustment today she is on multiple other medications at this time. Reviewed and discussed BMP-glucose elevated at 138 otherwise within normal limits-she will continue to work with her primary care provider for the same. Depakote 1000 mg p.o. daily Depakote level-therapeutic at 67 on 11/05/2018 Zyprexa 15 mg p.o. nightly Wellbutrin 100 mg p.o. daily  PTSD-stable We will continue to monitor closely  Anxiety disorder-stable Hydroxyzine 25 mg p.o. twice daily as needed  Follow-up in clinic in 1 month or sooner if needed.  December 22 at 3:15 PM  I have spent atleast 15 minutes non face to face with patient today. More than 50 % of the time was spent for psychoeducation and supportive psychotherapy and care coordination. This note was generated in part or whole with voice recognition software. Voice recognition is usually quite accurate but there are transcription errors that can and very often do occur. I apologize for any typographical errors that were not detected and corrected.        Jomarie LongsSaramma Criss Bartles, MD 12/23/2018, 4:59 PM

## 2019-01-06 ENCOUNTER — Encounter: Payer: Self-pay | Admitting: Psychiatry

## 2019-01-06 ENCOUNTER — Other Ambulatory Visit: Payer: Self-pay

## 2019-01-06 ENCOUNTER — Ambulatory Visit (INDEPENDENT_AMBULATORY_CARE_PROVIDER_SITE_OTHER): Payer: BLUE CROSS/BLUE SHIELD | Admitting: Psychiatry

## 2019-01-06 DIAGNOSIS — F4312 Post-traumatic stress disorder, chronic: Secondary | ICD-10-CM

## 2019-01-06 DIAGNOSIS — F3178 Bipolar disorder, in full remission, most recent episode mixed: Secondary | ICD-10-CM | POA: Diagnosis not present

## 2019-01-06 DIAGNOSIS — F4010 Social phobia, unspecified: Secondary | ICD-10-CM | POA: Diagnosis not present

## 2019-01-06 DIAGNOSIS — F3177 Bipolar disorder, in partial remission, most recent episode mixed: Secondary | ICD-10-CM | POA: Insufficient documentation

## 2019-01-06 MED ORDER — OLANZAPINE 15 MG PO TBDP: 15.0000 mg | ORAL_TABLET | Freq: Every day | ORAL | 1 refills | Status: DC

## 2019-01-06 MED ORDER — BUPROPION HCL 100 MG PO TABS: 100.0000 mg | ORAL_TABLET | Freq: Every day | ORAL | 1 refills | Status: DC

## 2019-01-06 NOTE — Progress Notes (Signed)
Virtual Visit via Telephone Note  I connected with Pamela Rasmussen on 01/06/19 at 10:15 AM EST by telephone and verified that I am speaking with the correct person using two identifiers.   I discussed the limitations, risks, security and privacy concerns of performing an evaluation and management service by telephone and the availability of in person appointments. I also discussed with the patient that there may be a patient responsible charge related to this service. The patient expressed understanding and agreed to proceed.       I discussed the assessment and treatment plan with the patient. The patient was provided an opportunity to ask questions and all were answered. The patient agreed with the plan and demonstrated an understanding of the instructions.   The patient was advised to call back or seek an in-person evaluation if the symptoms worsen or if the condition fails to improve as anticipated.   [facility] MD OP Progress Note  01/06/2019 11:27 AM Pamela Rasmussen  MRN:  [id number]  Chief Complaint:  Chief Complaint    Follow-up     HPI: Pamela Rasmussen is a 38 year old Caucasian female, employed, single, lives in [place], has a history of bipolar disorder, PTSD, social anxiety disorder was evaluated by phone today.  Patient preferred to do a phone call.  Collateral information was obtained from her mother.  Patient today reports she is doing well.  She has been unable to find a job yet.  She got connected with an agency and is trying to find something new.  She reports she believes her mood symptoms are currently stable.  She continues to be compliant on medications.  She reports sleep is good.  Patient denies any suicidality, homicidality or perceptual disturbances.  Per mother patient does miss her brother who is currently in a group home.  He has Down's syndrome and dementia.  Otherwise mother believes patient is doing well and her mood symptoms are stable on the current  combination of medications. Visit Diagnosis:    ICD-10-CM   1. Bipolar disorder, in full remission, most recent episode mixed ([place])  F31.78   2. Chronic post-traumatic stress disorder (PTSD)  F43.12 buPROPion (WELLBUTRIN) 100 MG tablet    olanzapine zydis (ZYPREXA ZYDIS) 15 MG disintegrating tablet  3. Social anxiety disorder  F40.10     Past Psychiatric History: Reviewed past psychiatric history from my progress note on 01/04/2018.  Past trials of Celexa, Lexapro, olanzapine, Wellbutrin, Depakote, risperidone.  Past Medical History:  Past Medical History:  Diagnosis Date  . Anxiety   . Bipolar 1 disorder ([place])   . Vertigo     Past Surgical History:  Procedure Laterality Date  . CHOLECYSTECTOMY      Family Psychiatric History: Reviewed family psychiatric history from my progress note on 01/04/2018.  Family History:  Family History  Problem Relation Age of Onset  . Bipolar disorder Mother   . Down syndrome Brother   . Schizophrenia Maternal Aunt   . Bipolar disorder Paternal Grandmother   . Schizophrenia Paternal Grandmother     Social History: Reviewed social history from my progress note on 01/04/2018. Social History   Socioeconomic History  . Marital status: Significant Other    Spouse name: Not on file  . Number of children: 72  . Years of education: Not on file  . Highest education level: Associate degree: occupational, Hotel manager, or vocational program  Occupational History    Comment: not employed  Social Needs  . Financial resource strain: Not hard at  all  . Food insecurity    Worry: Never true    Inability: Never true  . Transportation needs    Medical: Yes    Non-medical: Yes  Tobacco Use  . Smoking status: Never Smoker  . Smokeless tobacco: Never Used  Substance and Sexual Activity  . Alcohol use: No  . Drug use: No  . Sexual activity: Yes  Lifestyle  . Physical activity    Days per week: 0 days    Minutes per session: 0 min  . Stress: Rather much   Relationships  . Social Musicianconnections    Talks on phone: Not on file    Gets together: Not on file    Attends religious service: 1 to 4 times per year    Active member of club or organization: No    Attends meetings of clubs or organizations: Never    Relationship status: Never married  Other Topics Concern  . Not on file  Social History Narrative  . Not on file    Allergies:  Allergies  Allergen Reactions  . Levaquin [Levofloxacin In D5w] Itching  . Penicillins Rash    Has patient had a PCN reaction causing immediate rash, facial/tongue/throat swelling, SOB or lightheadedness with hypotension: No Has patient had a PCN reaction causing severe rash involving mucus membranes or skin necrosis: No Has patient had a PCN reaction that required hospitalization: No Has patient had a PCN reaction occurring within the last 10 years: No If all of the above answers are "NO", then may proceed with Cephalosporin use.     Metabolic Disorder Labs: No results found for: HGBA1C, MPG Lab Results  Component Value Date   PROLACTIN 6.9 01/11/2018   No results found for: CHOL, TRIG, HDL, CHOLHDL, VLDL, LDLCALC Lab Results  Component Value Date   TSH 2.530 11/05/2018    Therapeutic Level Labs: Lab Results  Component Value Date   LITHIUM 0.2 (L) 12/08/2018   LITHIUM 0.3 (L) 07/23/2018   Lab Results  Component Value Date   VALPROATE 67 11/05/2018   VALPROATE 41 (L) 07/23/2018   No components found for:  CBMZ  Current Medications: Current Outpatient Medications  Medication Sig Dispense Refill  . buPROPion (WELLBUTRIN) 100 MG tablet Take 1 tablet (100 mg total) by mouth daily with breakfast. mood 30 tablet 1  . diclofenac (VOLTAREN) 75 MG EC tablet     . divalproex (DEPAKOTE ER) 500 MG 24 hr tablet Take 2 tablets (1,000 mg total) by mouth at bedtime. 180 tablet 1  . hydrOXYzine (VISTARIL) 25 MG capsule Take 1 capsule (25 mg total) by mouth 3 (three) times daily as needed. 90 capsule 1   . lithium 300 MG tablet Take 1 tablet (300 mg total) by mouth daily with breakfast. 90 tablet 1  . medroxyPROGESTERone (DEPO-PROVERA) 150 MG/ML injection every 3 (three) months    . olanzapine zydis (ZYPREXA ZYDIS) 15 MG disintegrating tablet Take 1 tablet (15 mg total) by mouth at bedtime. 30 tablet 1   No current facility-administered medications for this visit.      Musculoskeletal: Strength & Muscle Tone: UTA Gait & Station: Reports as WNL Patient leans: N/A  Psychiatric Specialty Exam: Review of Systems  Psychiatric/Behavioral: Negative for depression, hallucinations, substance abuse and suicidal ideas. The patient is not nervous/anxious and does not have insomnia.   All other systems reviewed and are negative.   There were no vitals taken for this visit.There is no height or weight on file to calculate BMI.  General Appearance: UTA  Eye Contact:  UTA  Speech:  Clear and Coherent  Volume:  Normal  Mood:  Euthymic  Affect:  UTA  Thought Process:  Goal Directed and Descriptions of Associations: Intact  Orientation:  Full (Time, Place, and Person)  Thought Content: Logical   Suicidal Thoughts:  No  Homicidal Thoughts:  No  Memory:  Immediate;   Fair Recent;   Fair Remote;   Fair  Judgement:  Fair  Insight:  Fair  Psychomotor Activity:  UTA  Concentration:  Concentration: Fair and Attention Span: Fair  Recall:  [employer] of Knowledge: Fair  Language: Fair  Akathisia:  No  Handed:  Right  AIMS (if indicated): Denies tremors, rigidity  Assets:  Communication Skills Desire for Improvement Housing Social Support  ADL's:  Intact  Cognition: WNL  Sleep:  Fair   Screenings:   Assessment and Plan: Caleen is a 38 year old Caucasian female, lives in [place] with her mother, has a history of bipolar disorder, PTSD, social anxiety disorder was evaluated by phone today.  Patient is biologically predisposed given her history of trauma, family history of mental health  problems.  Patient with psychosocial stressors of unemployment, current pandemic however is currently stable on medication regimen.  Plan as noted below.  Plan Bipolar disorder in full remission Lithium 300 mg p.o. daily with breakfast Lithium level on 12/08/2018-0.2-subtherapeutic.  However she is on multiple mood stabilizers at this time and will not increase the dosage. Depakote 1000 mg p.o. daily. Depakote level-therapeutic at 67 on 11/05/2018 Zyprexa 15 mg p.o. nightly Wellbutrin 100 mg p.o. daily  PTSD-stable We will continue to monitor closely  Social anxiety disorder-stable Hydroxyzine 25 mg p.o. twice daily as needed  Collateral information was obtained from mother as summarized above.  Follow-up in clinic in 1 to 2 months or sooner if needed.  January 28 at 10:20 AM  I have spent atleast 15 minutes non face to face with patient today. More than 50 % of the time was spent for psychoeducation and supportive psychotherapy and care coordination. This note was generated in part or whole with voice recognition software. Voice recognition is usually quite accurate but there are transcription errors that can and very often do occur. I apologize for any typographical errors that were not detected and corrected.       Jomarie Longs, MD 01/06/2019, 11:27 AM

## 2019-01-25 ENCOUNTER — Encounter: Payer: Self-pay | Admitting: Psychiatry

## 2019-01-25 ENCOUNTER — Ambulatory Visit (INDEPENDENT_AMBULATORY_CARE_PROVIDER_SITE_OTHER): Payer: BLUE CROSS/BLUE SHIELD | Admitting: Psychiatry

## 2019-01-25 ENCOUNTER — Other Ambulatory Visit: Payer: Self-pay

## 2019-01-25 DIAGNOSIS — F3178 Bipolar disorder, in full remission, most recent episode mixed: Secondary | ICD-10-CM | POA: Diagnosis not present

## 2019-01-25 DIAGNOSIS — F4312 Post-traumatic stress disorder, chronic: Secondary | ICD-10-CM | POA: Diagnosis not present

## 2019-01-25 DIAGNOSIS — F4010 Social phobia, unspecified: Secondary | ICD-10-CM | POA: Diagnosis not present

## 2019-01-25 NOTE — Progress Notes (Signed)
Virtual Visit via Telephone Note  I connected with Pamela Rasmussen on 01/25/19 at  3:15 PM EST by telephone and verified that I am speaking with the correct person using two identifiers.   I discussed the limitations, risks, security and privacy concerns of performing an evaluation and management service by telephone and the availability of in person appointments. I also discussed with the patient that there may be a patient responsible charge related to this service. The patient expressed understanding and agreed to proceed.     I discussed the assessment and treatment plan with the patient. The patient was provided an opportunity to ask questions and all were answered. The patient agreed with the plan and demonstrated an understanding of the instructions.   The patient was advised to call back or seek an in-person evaluation if the symptoms worsen or if the condition fails to improve as anticipated.   BH MD OP Progress Note  01/25/2019 5:47 PM Pamela Rasmussen  MRN:  [id number]  Chief Complaint:  Chief Complaint    Follow-up     HPI: Pamela Rasmussen is a 38 year old Caucasian female, employed, single, lives in [place], has a history of bipolar disorder, PTSD, social anxiety disorder was evaluated by phone today.  Patient preferred to do a phone call.  Patient today reports she is currently waiting for our drug test for a new job that she will be able to start soon.  She looks forward to that.  She reports mood wise she is doing okay.  She denies any significant mood swings.  She denies any psychosis.  Patient reports sleep and appetite is good.  Patient denies any suicidality, homicidality or perceptual disturbances.  Patient denies any other concerns today. Visit Diagnosis:    ICD-10-CM   1. Bipolar disorder, in full remission, most recent episode mixed (HCC)  F31.78   2. Chronic post-traumatic stress disorder (PTSD)  F43.12   3. Social anxiety disorder  F40.10      Past Psychiatric History: I have reviewed past psychiatric history from my progress note on 01/04/2018.  Past trials of Celexa, Lexapro, olanzapine, Wellbutrin, Depakote, risperidone.  Past Medical History:  Past Medical History:  Diagnosis Date  . Anxiety   . Bipolar 1 disorder (HCC)   . Vertigo     Past Surgical History:  Procedure Laterality Date  . CHOLECYSTECTOMY      Family Psychiatric History: I have reviewed past psychiatric history from my progress note on 01/04/2018.    Family History:  Family History  Problem Relation Age of Onset  . Bipolar disorder Mother   . Down syndrome Brother   . Schizophrenia Maternal Aunt   . Bipolar disorder Paternal Grandmother   . Schizophrenia Paternal Grandmother     Social History: I have reviewed social history from my progress note on 01/04/2018. Social History   Socioeconomic History  . Marital status: Significant Other    Spouse name: Not on file  . Number of children: 9  . Years of education: Not on file  . Highest education level: Associate degree: occupational, Scientist, product/process development, or vocational program  Occupational History    Comment: not employed  Tobacco Use  . Smoking status: Never Smoker  . Smokeless tobacco: Never Used  Substance and Sexual Activity  . Alcohol use: No  . Drug use: No  . Sexual activity: Yes  Other Topics Concern  . Not on file  Social History Narrative  . Not on file   Social Determinants of Health  Financial Resource Strain:   . Difficulty of Paying Living Expenses: Not on file  Food Insecurity:   . Worried About Charity fundraiser in the Last Year: Not on file  . Ran Out of Food in the Last Year: Not on file  Transportation Needs:   . Lack of Transportation (Medical): Not on file  . Lack of Transportation (Non-Medical): Not on file  Physical Activity:   . Days of Exercise per Week: Not on file  . Minutes of Exercise per Session: Not on file  Stress:   . Feeling of Stress : Not on file   Social Connections:   . Frequency of Communication with Friends and Family: Not on file  . Frequency of Social Gatherings with Friends and Family: Not on file  . Attends Religious Services: Not on file  . Active Member of Clubs or Organizations: Not on file  . Attends Archivist Meetings: Not on file  . Marital Status: Not on file    Allergies:  Allergies  Allergen Reactions  . Levaquin [Levofloxacin In D5w] Itching  . Penicillins Rash    Has patient had a PCN reaction causing immediate rash, facial/tongue/throat swelling, SOB or lightheadedness with hypotension: No Has patient had a PCN reaction causing severe rash involving mucus membranes or skin necrosis: No Has patient had a PCN reaction that required hospitalization: No Has patient had a PCN reaction occurring within the last 10 years: No If all of the above answers are "NO", then may proceed with Cephalosporin use.     Metabolic Disorder Labs: No results found for: HGBA1C, MPG Lab Results  Component Value Date   PROLACTIN 6.9 01/11/2018   No results found for: CHOL, TRIG, HDL, CHOLHDL, VLDL, LDLCALC Lab Results  Component Value Date   TSH 2.530 11/05/2018    Therapeutic Level Labs: Lab Results  Component Value Date   LITHIUM 0.2 (L) 12/08/2018   LITHIUM 0.3 (L) 07/23/2018   Lab Results  Component Value Date   VALPROATE 67 11/05/2018   VALPROATE 41 (L) 07/23/2018   No components found for:  CBMZ  Current Medications: Current Outpatient Medications  Medication Sig Dispense Refill  . buPROPion (WELLBUTRIN) 100 MG tablet Take 1 tablet (100 mg total) by mouth daily with breakfast. mood 30 tablet 1  . diclofenac (VOLTAREN) 75 MG EC tablet     . divalproex (DEPAKOTE ER) 500 MG 24 hr tablet Take 2 tablets (1,000 mg total) by mouth at bedtime. 180 tablet 1  . hydrOXYzine (VISTARIL) 25 MG capsule Take 1 capsule (25 mg total) by mouth 3 (three) times daily as needed. 90 capsule 1  . lithium 300 MG tablet  Take 1 tablet (300 mg total) by mouth daily with breakfast. 90 tablet 1  . medroxyPROGESTERone (DEPO-PROVERA) 150 MG/ML injection every 3 (three) months    . olanzapine zydis (ZYPREXA ZYDIS) 15 MG disintegrating tablet Take 1 tablet (15 mg total) by mouth at bedtime. 30 tablet 1   No current facility-administered medications for this visit.     Musculoskeletal: Strength & Muscle Tone: UTA Gait & Station: UTA Patient leans: N/A  Psychiatric Specialty Exam: Review of Systems  Psychiatric/Behavioral: Negative for agitation, behavioral problems, confusion, decreased concentration, dysphoric mood, hallucinations, self-injury and sleep disturbance. The patient is not nervous/anxious and is not hyperactive.   All other systems reviewed and are negative.   There were no vitals taken for this visit.There is no height or weight on file to calculate BMI.  General Appearance:  UTA  Eye Contact:  UTA  Speech:  Clear and Coherent  Volume:  Normal  Mood:  Euthymic  Affect:  UTA  Thought Process:  Goal Directed and Descriptions of Associations: Intact  Orientation:  Full (Time, Place, and Person)  Thought Content: Logical   Suicidal Thoughts:  No  Homicidal Thoughts:  No  Memory:  Immediate;   Fair Recent;   Fair Remote;   Fair  Judgement:  Fair  Insight:  Fair  Psychomotor Activity:  UTA  Concentration:  Concentration: Fair and Attention Span: Fair  Recall:  [employer]Fair  Fund of Knowledge: Fair  Language: Fair  Akathisia:  No  Handed:  Right  AIMS (if indicated): Denies tremors, rigidity  Assets:  Communication Skills Desire for Improvement Housing Social Support  ADL's:  Intact  Cognition: WNL  Sleep:  Fair   Screenings:   Assessment and Plan: Zella BallRobin is a 38 year old Caucasian female, lives in [place] with her mother, has a history of bipolar disorder, PTSD, social anxiety disorder was evaluated by phone today.  Patient is biologically predisposed given her history of trauma, family  history of mental health problems.  Patient with psychosocial stressors of unemployment, current pandemic however is currently making progress on current medication regimen.  Plan as noted below.  Plan Bipolar disorder in full remission Lithium 300 mg p.o. daily with breakfast Lithium level on 12/08/2018-0.2-subtherapeutic.  She is on multiple mood stabilizers at this time and will not increase the dosage. Depakote 1000 mg p.o. daily. Depakote level-therapeutic at 67 on 11/05/2018. Zyprexa 15 mg p.o. nightly Wellbutrin 100 mg p.o. daily  PTSD-stable Will continue to monitor closely  Social anxiety disorder-stable Hydroxyzine 25 mg p.o. twice daily as needed  Follow-up in clinic in 1 month or sooner if needed.  February 1 at 2 PM  I have spent atleast 15 minutes non face to face with patient today. More than 50 % of the time was spent for psychoeducation and supportive psychotherapy and care coordination. This note was generated in part or whole with voice recognition software. Voice recognition is usually quite accurate but there are transcription errors that can and very often do occur. I apologize for any typographical errors that were not detected and corrected.       Jomarie LongsSaramma Mantaj Chamberlin, MD 01/25/2019, 5:47 PM

## 2019-03-03 ENCOUNTER — Ambulatory Visit: Payer: BLUE CROSS/BLUE SHIELD | Admitting: Psychiatry

## 2019-03-07 ENCOUNTER — Ambulatory Visit (INDEPENDENT_AMBULATORY_CARE_PROVIDER_SITE_OTHER): Payer: BLUE CROSS/BLUE SHIELD | Admitting: Psychiatry

## 2019-03-07 ENCOUNTER — Other Ambulatory Visit: Payer: Self-pay

## 2019-03-07 ENCOUNTER — Encounter: Payer: Self-pay | Admitting: Psychiatry

## 2019-03-07 DIAGNOSIS — F4010 Social phobia, unspecified: Secondary | ICD-10-CM | POA: Diagnosis not present

## 2019-03-07 DIAGNOSIS — F4312 Post-traumatic stress disorder, chronic: Secondary | ICD-10-CM

## 2019-03-07 DIAGNOSIS — F3178 Bipolar disorder, in full remission, most recent episode mixed: Secondary | ICD-10-CM

## 2019-03-07 NOTE — Progress Notes (Signed)
Provider Location : [facility] Patient Location : Home   Virtual Visit via Telephone Note  I connected with Pamela Rasmussen on 03/07/19 at  2:00 PM EST by telephone and verified that I am speaking with the correct person using two identifiers.   I discussed the limitations, risks, security and privacy concerns of performing an evaluation and management service by telephone and the availability of in person appointments. I also discussed with the patient that there may be a patient responsible charge related to this service. The patient expressed understanding and agreed to proceed.    I discussed the assessment and treatment plan with the patient. The patient was provided an opportunity to ask questions and all were answered. The patient agreed with the plan and demonstrated an understanding of the instructions.   The patient was advised to call back or seek an in-person evaluation if the symptoms worsen or if the condition fails to improve as anticipated.   BH MD OP Progress Note  03/07/2019 2:15 PM Pamela Rasmussen  MRN:  [id number]  Chief Complaint:  Chief Complaint    Follow-up     HPI: Pamela Rasmussen is a 39 year old Caucasian female,  single, lives in [place], has a history of bipolar disorder, PTSD, social anxiety disorder was evaluated by phone today.  Patient preferred to do a phone call.  Patient today reports she is currently doing well on the current medication regimen.  Patient reports she is compliant on all her medications.  She denies side effects.  She reports sleep and appetite as good.  She denies any suicidality, homicidality or perceptual disturbances.  Patient denies any other concerns today. Visit Diagnosis:    ICD-10-CM   1. Bipolar disorder, in full remission, most recent episode mixed (HCC)  F31.78   2. Chronic post-traumatic stress disorder (PTSD)  F43.12   3. Social anxiety disorder  F40.10     Past Psychiatric History: Reviewed past psychiatric  history from my progress note on 01/04/2018.  Past trials of Celexa, Lexapro, olanzapine, Wellbutrin, Depakote, risperidone.  Past Medical History:  Past Medical History:  Diagnosis Date  . Anxiety   . Bipolar 1 disorder (HCC)   . Vertigo     Past Surgical History:  Procedure Laterality Date  . CHOLECYSTECTOMY      Family Psychiatric History: Reviewed family psychiatric history from my progress note on 01/04/2018.  Family History:  Family History  Problem Relation Age of Onset  . Bipolar disorder Mother   . Down syndrome Brother   . Schizophrenia Maternal Aunt   . Bipolar disorder Paternal Grandmother   . Schizophrenia Paternal Grandmother     Social History: Reviewed social history from my progress note on 01/04/2018. Social History   Socioeconomic History  . Marital status: Significant Other    Spouse name: Not on file  . Number of children: 9  . Years of education: Not on file  . Highest education level: Associate degree: occupational, Scientist, product/process development, or vocational program  Occupational History    Comment: not employed  Tobacco Use  . Smoking status: Never Smoker  . Smokeless tobacco: Never Used  Substance and Sexual Activity  . Alcohol use: No  . Drug use: No  . Sexual activity: Yes  Other Topics Concern  . Not on file  Social History Narrative  . Not on file   Social Determinants of Health   Financial Resource Strain:   . Difficulty of Paying Living Expenses: Not on file  Food Insecurity:   .  Worried About Charity fundraiser in the Last Year: Not on file  . Ran Out of Food in the Last Year: Not on file  Transportation Needs:   . Lack of Transportation (Medical): Not on file  . Lack of Transportation (Non-Medical): Not on file  Physical Activity:   . Days of Exercise per Week: Not on file  . Minutes of Exercise per Session: Not on file  Stress:   . Feeling of Stress : Not on file  Social Connections:   . Frequency of Communication with Friends and Family:  Not on file  . Frequency of Social Gatherings with Friends and Family: Not on file  . Attends Religious Services: Not on file  . Active Member of Clubs or Organizations: Not on file  . Attends Archivist Meetings: Not on file  . Marital Status: Not on file    Allergies:  Allergies  Allergen Reactions  . Levaquin [Levofloxacin In D5w] Itching  . Penicillins Rash    Has patient had a PCN reaction causing immediate rash, facial/tongue/throat swelling, SOB or lightheadedness with hypotension: No Has patient had a PCN reaction causing severe rash involving mucus membranes or skin necrosis: No Has patient had a PCN reaction that required hospitalization: No Has patient had a PCN reaction occurring within the last 10 years: No If all of the above answers are "NO", then may proceed with Cephalosporin use.     Metabolic Disorder Labs: No results found for: HGBA1C, MPG Lab Results  Component Value Date   PROLACTIN 6.9 01/11/2018   No results found for: CHOL, TRIG, HDL, CHOLHDL, VLDL, LDLCALC Lab Results  Component Value Date   TSH 2.530 11/05/2018    Therapeutic Level Labs: Lab Results  Component Value Date   LITHIUM 0.2 (L) 12/08/2018   LITHIUM 0.3 (L) 07/23/2018   Lab Results  Component Value Date   VALPROATE 67 11/05/2018   VALPROATE 41 (L) 07/23/2018   No components found for:  CBMZ  Current Medications: Current Outpatient Medications  Medication Sig Dispense Refill  . buPROPion (WELLBUTRIN) 100 MG tablet Take 1 tablet (100 mg total) by mouth daily with breakfast. mood 30 tablet 1  . diclofenac (VOLTAREN) 75 MG EC tablet     . divalproex (DEPAKOTE ER) 500 MG 24 hr tablet Take 2 tablets (1,000 mg total) by mouth at bedtime. 180 tablet 1  . hydrOXYzine (VISTARIL) 25 MG capsule Take 1 capsule (25 mg total) by mouth 3 (three) times daily as needed. 90 capsule 1  . lithium 300 MG tablet Take 1 tablet (300 mg total) by mouth daily with breakfast. 90 tablet 1  .  medroxyPROGESTERone (DEPO-PROVERA) 150 MG/ML injection every 3 (three) months    . olanzapine zydis (ZYPREXA ZYDIS) 15 MG disintegrating tablet Take 1 tablet (15 mg total) by mouth at bedtime. 30 tablet 1   No current facility-administered medications for this visit.     Musculoskeletal: Strength & Muscle Tone: UTA Gait & Station: normal Patient leans: N/A  Psychiatric Specialty Exam: Review of Systems  Psychiatric/Behavioral: Negative for agitation, behavioral problems, confusion, decreased concentration, dysphoric mood, hallucinations, self-injury, sleep disturbance and suicidal ideas. The patient is not nervous/anxious and is not hyperactive.   All other systems reviewed and are negative.   There were no vitals taken for this visit.There is no height or weight on file to calculate BMI.  General Appearance: Casual  Eye Contact:  Fair  Speech:  Clear and Coherent  Volume:  Normal  Mood:  Euthymic  Affect:  Congruent  Thought Process:  Goal Directed and Descriptions of Associations: Intact  Orientation:  Full (Time, Place, and Person)  Thought Content: Logical   Suicidal Thoughts:  No  Homicidal Thoughts:  No  Memory:  Immediate;   Fair Recent;   Fair Remote;   Fair  Judgement:  Fair  Insight:  Fair  Psychomotor Activity:  Normal  Concentration:  Concentration: Fair and Attention Span: Fair  Recall:  [employer] of Knowledge: Fair  Language: Fair  Akathisia:  No  Handed:  Right  AIMS (if indicated): Denies tremors, rigidity  Assets:  Communication Skills Desire for Improvement Housing Social Support  ADL's:  Intact  Cognition: WNL  Sleep:  Fair   Screenings:   Assessment and Plan: Sakeena is a 38 year old Caucasian female, lives in [place] with her mother, has a history of bipolar disorder, PTSD, social anxiety disorder was evaluated by phone today.  She is biologically predisposed given her history of trauma, family history of mental health problems.  Patient  with psychosocial stressors of current pandemic job related stressors.  Patient will continue to benefit from medication management.  Plan as noted below.  Plan Bipolar disorder in full remission Lithium 300 mg p.o. daily with breakfast Lithium level on 12/08/2018-0.2-subtherapeutic. Depakote 1000 mg p.o. daily Depakote level 67-therapeutic on 11/05/2018. Zyprexa 15 mg p.o. nightly. Wellbutrin 100 mg p.o. daily  PTSD-stable We will continue to monitor closely  Social anxiety disorder-stable Hydroxyzine 25 mg p.o. twice daily as needed  Follow-up in clinic in 2 months or sooner if needed.  I have spent atleast 20 minutes non face to face with patient today. More than 50 % of the time was spent for  ordering medications and test ,psychoeducation and supportive psychotherapy and care coordination,as well as documenting clinical information in electronic health record. This note was generated in part or whole with voice recognition software. Voice recognition is usually quite accurate but there are transcription errors that can and very often do occur. I apologize for any typographical errors that were not detected and corrected.       Jomarie Longs, MD 03/07/2019, 2:15 PM

## 2019-04-08 ENCOUNTER — Other Ambulatory Visit: Payer: Self-pay | Admitting: Psychiatry

## 2019-04-08 DIAGNOSIS — F4312 Post-traumatic stress disorder, chronic: Secondary | ICD-10-CM

## 2019-05-03 ENCOUNTER — Ambulatory Visit: Payer: BLUE CROSS/BLUE SHIELD | Admitting: Psychiatry

## 2019-05-19 ENCOUNTER — Other Ambulatory Visit: Payer: Self-pay | Admitting: Psychiatry

## 2019-05-19 DIAGNOSIS — F4312 Post-traumatic stress disorder, chronic: Secondary | ICD-10-CM

## 2019-05-30 ENCOUNTER — Other Ambulatory Visit: Payer: Self-pay

## 2019-05-30 ENCOUNTER — Telehealth (INDEPENDENT_AMBULATORY_CARE_PROVIDER_SITE_OTHER): Payer: BLUE CROSS/BLUE SHIELD | Admitting: Psychiatry

## 2019-05-30 ENCOUNTER — Encounter: Payer: Self-pay | Admitting: Psychiatry

## 2019-05-30 DIAGNOSIS — F4312 Post-traumatic stress disorder, chronic: Secondary | ICD-10-CM | POA: Diagnosis not present

## 2019-05-30 DIAGNOSIS — Z79899 Other long term (current) drug therapy: Secondary | ICD-10-CM | POA: Diagnosis not present

## 2019-05-30 DIAGNOSIS — F3178 Bipolar disorder, in full remission, most recent episode mixed: Secondary | ICD-10-CM

## 2019-05-30 DIAGNOSIS — F4010 Social phobia, unspecified: Secondary | ICD-10-CM

## 2019-05-30 MED ORDER — OLANZAPINE 15 MG PO TBDP: ORAL_TABLET | ORAL | 2 refills | Status: DC

## 2019-05-30 MED ORDER — DIVALPROEX SODIUM ER 500 MG PO TB24: 1000.0000 mg | ORAL_TABLET | Freq: Every day | ORAL | 1 refills | Status: DC

## 2019-05-30 MED ORDER — LITHIUM CARBONATE 300 MG PO TABS: 300.0000 mg | ORAL_TABLET | Freq: Every day | ORAL | 1 refills | Status: DC

## 2019-05-30 NOTE — Progress Notes (Signed)
Provider Location : [facility] Patient Location : [facility]  Virtual Visit via Video Note  I connected with Pamela Rasmussen on 05/30/19 at  4:30 PM EDT by a video enabled telemedicine application and verified that I am speaking with the correct person using two identifiers.   I discussed the limitations of evaluation and management by telemedicine and the availability of in person appointments. The patient expressed understanding and agreed to proceed.    I discussed the assessment and treatment plan with the patient. The patient was provided an opportunity to ask questions and all were answered. The patient agreed with the plan and demonstrated an understanding of the instructions.   The patient was advised to call back or seek an in-person evaluation if the symptoms worsen or if the condition fails to improve as anticipated.   [facility] MD OP Progress Note  05/30/2019 4:46 PM Pamela Rasmussen  MRN:  [id number]  Chief Complaint:  Chief Complaint    Follow-up     HPI: Pamela Rasmussen is a 39 year old Caucasian female, single, lives in [place], has a history of bipolar disorder, PTSD, social anxiety disorder was evaluated by telemedicine today.   Patient today reports she is currently doing well with regards to her mood.  She denies any suicidality, homicidality or perceptual disturbances.  She is sleeping good.  She reports appetite is fair.  She is compliant on her lithium, Depakote and Zyprexa.  She stopped taking the Wellbutrin since she felt it did not help much.  Patient reports she is scheduled for a colonoscopy since she has a history of polyps.  She reports she quit her job since they would not give her the day off to get her colonoscopy done.  She is currently looking for another job.  She reports she is spending time with her boyfriend.  They have been together since the past 12 years.  Patient denies any other concerns today.  Visit Diagnosis:    ICD-10-CM   1.  Bipolar disorder, in full remission, most recent episode mixed (HCC)  F31.78 lithium 300 MG tablet   improving  2. Chronic post-traumatic stress disorder (PTSD)  F43.12 divalproex (DEPAKOTE ER) 500 MG 24 hr tablet    olanzapine zydis (ZYPREXA) 15 MG disintegrating tablet  3. Social anxiety disorder  F40.10 divalproex (DEPAKOTE ER) 500 MG 24 hr tablet  4. High risk medication use  Z79.899 Lithium level    Valproic acid level    Hemoglobin A1C    CBC With Differential    Comprehensive metabolic panel    Past Psychiatric History: I have reviewed past psychiatric history from my progress note on 01/04/2018.  Past trials of Celexa, Lexapro, olanzapine, Wellbutrin, Depakote, risperidone  Past Medical History:  Past Medical History:  Diagnosis Date  . Anxiety   . Bipolar 1 disorder ([place])   . Vertigo     Past Surgical History:  Procedure Laterality Date  . CHOLECYSTECTOMY      Family Psychiatric History: I have reviewed family psychiatric history from my progress note on 01/04/2089  Family History:  Family History  Problem Relation Age of Onset  . Bipolar disorder Mother   . Down syndrome Brother   . Schizophrenia Maternal Aunt   . Bipolar disorder Paternal Grandmother   . Schizophrenia Paternal Grandmother     Social History: Reviewed social history from my progress note on 01/04/2018 Social History   Socioeconomic History  . Marital status: Significant Other    Spouse name: Not on file  .  Number of children: 9  . Years of education: Not on file  . Highest education level: Associate degree: occupational, Scientist, product/process development, or vocational program  Occupational History    Comment: not employed  Tobacco Use  . Smoking status: Never Smoker  . Smokeless tobacco: Never Used  Substance and Sexual Activity  . Alcohol use: No  . Drug use: No  . Sexual activity: Yes  Other Topics Concern  . Not on file  Social History Narrative  . Not on file   Social Determinants of Health    Financial Resource Strain:   . Difficulty of Paying Living Expenses:   Food Insecurity:   . Worried About Programme researcher, broadcasting/film/video in the Last Year:   . Barista in the Last Year:   Transportation Needs:   . Freight forwarder (Medical):   Marland Kitchen Lack of Transportation (Non-Medical):   Physical Activity:   . Days of Exercise per Week:   . Minutes of Exercise per Session:   Stress:   . Feeling of Stress :   Social Connections:   . Frequency of Communication with Friends and Family:   . Frequency of Social Gatherings with Friends and Family:   . Attends Religious Services:   . Active Member of Clubs or Organizations:   . Attends Banker Meetings:   Marland Kitchen Marital Status:     Allergies:  Allergies  Allergen Reactions  . Levaquin [Levofloxacin In D5w] Itching  . Penicillins Rash    Has patient had a PCN reaction causing immediate rash, facial/tongue/throat swelling, SOB or lightheadedness with hypotension: No Has patient had a PCN reaction causing severe rash involving mucus membranes or skin necrosis: No Has patient had a PCN reaction that required hospitalization: No Has patient had a PCN reaction occurring within the last 10 years: No If all of the above answers are "NO", then may proceed with Cephalosporin use.     Metabolic Disorder Labs: No results found for: HGBA1C, MPG Lab Results  Component Value Date   PROLACTIN 6.9 01/11/2018   No results found for: CHOL, TRIG, HDL, CHOLHDL, VLDL, LDLCALC Lab Results  Component Value Date   TSH 2.530 11/05/2018    Therapeutic Level Labs: Lab Results  Component Value Date   LITHIUM 0.2 (L) 12/08/2018   LITHIUM 0.3 (L) 07/23/2018   Lab Results  Component Value Date   VALPROATE 67 11/05/2018   VALPROATE 41 (L) 07/23/2018   No components found for:  CBMZ  Current Medications: Current Outpatient Medications  Medication Sig Dispense Refill  . diclofenac (VOLTAREN) 75 MG EC tablet     . divalproex (DEPAKOTE  ER) 500 MG 24 hr tablet Take 2 tablets (1,000 mg total) by mouth at bedtime. 180 tablet 1  . hydrOXYzine (VISTARIL) 25 MG capsule Take 1 capsule (25 mg total) by mouth 3 (three) times daily as needed. 90 capsule 1  . lithium 300 MG tablet Take 1 tablet (300 mg total) by mouth daily with breakfast. 90 tablet 1  . medroxyPROGESTERone (DEPO-PROVERA) 150 MG/ML injection every 3 (three) months    . olanzapine zydis (ZYPREXA) 15 MG disintegrating tablet DISSOLVE 1 TABLET IN MOUTH AT BEDTIME 30 tablet 2   No current facility-administered medications for this visit.     Musculoskeletal: Strength & Muscle Tone: UTA Gait & Station: normal Patient leans: N/A  Psychiatric Specialty Exam: Review of Systems  Psychiatric/Behavioral: Negative for agitation, confusion, decreased concentration, dysphoric mood, hallucinations, self-injury, sleep disturbance and suicidal ideas. The  patient is not nervous/anxious and is not hyperactive.   All other systems reviewed and are negative.   There were no vitals taken for this visit.There is no height or weight on file to calculate BMI.  General Appearance: Casual  Eye Contact:  Fair  Speech:  Clear and Coherent  Volume:  Normal  Mood:  Euthymic  Affect:  Congruent  Thought Process:  Goal Directed and Descriptions of Associations: Intact  Orientation:  Full (Time, Place, and Person)  Thought Content: Logical   Suicidal Thoughts:  No  Homicidal Thoughts:  No  Memory:  Immediate;   Fair Recent;   Fair Remote;   Fair  Judgement:  Fair  Insight:  Fair  Psychomotor Activity:  Normal  Concentration:  Concentration: Fair and Attention Span: Fair  Recall:  [employer] of Knowledge: Fair  Language: Fair  Akathisia:  No  Handed:  Right  AIMS (if indicated): UTA  Assets:  Communication Skills Desire for Improvement Housing Social Support  ADL's:  Intact  Cognition: WNL  Sleep:  Fair   Screenings:   Assessment and Plan: Pamela Rasmussen is a 39 year old  Caucasian female, lives in [place] with her mother, has a history of bipolar disorder, PTSD, social anxiety disorder was evaluated by telemedicine today.  Patient is biologically predisposed given her history of trauma, family history of mental health problems.  Patient with psychosocial stressors of the current pandemic, job loss.  She will continue to benefit from medication management.  Plan as noted below.  Plan Bipolar disorder in full remission Lithium 300 mg p.o. daily with breakfast Lithium level on 12/08/2018-0.2-subtherapeutic Depakote 1000 mg p.o. daily Depakote level 67-therapeutic on 11/05/2018 Zyprexa 15 mg p.o. nightly Discontinue Wellbutrin for noncompliance.     PTSD-stable We will monitor closely  Social anxiety disorder-stable She does have hydroxyzine available however she has not been using it.  High risk medication use-we will order the following labs-Depakote level, CMP, CBC with differential, hemoglobin A1c, lithium level.  Will mail lab slip to her.  Follow-up in clinic in 3 months or sooner if needed.  I have spent atleast 20 minutes non face to face with patient today. More than 50 % of the time was spent for preparing to see the patient ( e.g., review of test, records ), obtaining and to review and separately obtained history , ordering medications and test ,psychoeducation and supportive psychotherapy and care coordination,as well as documenting clinical information in electronic health record. This note was generated in part or whole with voice recognition software. Voice recognition is usually quite accurate but there are transcription errors that can and very often do occur. I apologize for any typographical errors that were not detected and corrected.       Jomarie Longs, MD 05/30/2019, 4:46 PM

## 2019-06-02 ENCOUNTER — Telehealth: Payer: Self-pay

## 2019-06-02 NOTE — Telephone Encounter (Signed)
labwork orders mailed  

## 2019-06-17 ENCOUNTER — Telehealth: Payer: Self-pay | Admitting: Psychiatry

## 2019-06-17 DIAGNOSIS — F4312 Post-traumatic stress disorder, chronic: Secondary | ICD-10-CM

## 2019-06-17 DIAGNOSIS — F4010 Social phobia, unspecified: Secondary | ICD-10-CM

## 2019-06-17 LAB — COMPREHENSIVE METABOLIC PANEL
ALT: 55 IU/L — ABNORMAL HIGH (ref 0–32)
AST: 38 IU/L (ref 0–40)
Albumin/Globulin Ratio: 1.5 (ref 1.2–2.2)
Albumin: 4.1 g/dL (ref 3.8–4.8)
Alkaline Phosphatase: 112 IU/L (ref 39–117)
BUN/Creatinine Ratio: 9 (ref 9–23)
BUN: 8 mg/dL (ref 6–20)
Bilirubin Total: <0.2 mg/dL (ref 0.0–1.2)
CO2: 19 mmol/L — ABNORMAL LOW (ref 20–29)
Calcium: 9.4 mg/dL (ref 8.7–10.2)
Chloride: 102 mmol/L (ref 96–106)
Creatinine, Ser: 0.91 mg/dL (ref 0.57–1.00)
GFR calc Af Amer: 93 mL/min/{1.73_m2} (ref >59)
GFR calc non Af Amer: 80 mL/min/{1.73_m2} (ref >59)
Globulin, Total: 2.7 g/dL (ref 1.5–4.5)
Glucose: 242 mg/dL — ABNORMAL HIGH (ref 65–99)
Potassium: 4.2 mmol/L (ref 3.5–5.2)
Sodium: 138 mmol/L (ref 134–144)
Total Protein: 6.8 g/dL (ref 6.0–8.5)

## 2019-06-17 LAB — CBC WITH DIFFERENTIAL
Basophils Absolute: 0.1 10*3/uL (ref 0.0–0.2)
Basos: 1 %
EOS (ABSOLUTE): 0.5 10*3/uL — ABNORMAL HIGH (ref 0.0–0.4)
Eos: 5 %
Hematocrit: 41.6 % (ref 34.0–46.6)
Hemoglobin: 14.2 g/dL (ref 11.1–15.9)
Immature Grans (Abs): 0.1 10*3/uL (ref 0.0–0.1)
Immature Granulocytes: 1 %
Lymphocytes Absolute: 2.6 10*3/uL (ref 0.7–3.1)
Lymphs: 27 %
MCH: 32.6 pg (ref 26.6–33.0)
MCHC: 34.1 g/dL (ref 31.5–35.7)
MCV: 96 fL (ref 79–97)
Monocytes Absolute: 0.8 10*3/uL (ref 0.1–0.9)
Monocytes: 8 %
Neutrophils Absolute: 5.8 10*3/uL (ref 1.4–7.0)
Neutrophils: 58 %
RBC: 4.35 x10E6/uL (ref 3.77–5.28)
RDW: 12.3 % (ref 11.7–15.4)
WBC: 9.8 10*3/uL (ref 3.4–10.8)

## 2019-06-17 LAB — HEMOGLOBIN A1C
Est. average glucose Bld gHb Est-mCnc: 189 mg/dL
Hgb A1c MFr Bld: 8.2 % — ABNORMAL HIGH (ref 4.8–5.6)

## 2019-06-17 LAB — LITHIUM LEVEL: Lithium Lvl: 0.3 mmol/L — ABNORMAL LOW (ref 0.6–1.2)

## 2019-06-17 LAB — VALPROIC ACID LEVEL: Valproic Acid Lvl: 30 ug/mL — ABNORMAL LOW (ref 50–100)

## 2019-06-17 MED ORDER — DIVALPROEX SODIUM ER 500 MG PO TB24: 500.0000 mg | ORAL_TABLET | Freq: Every day | ORAL | 1 refills | Status: DC

## 2019-06-17 NOTE — Telephone Encounter (Signed)
Called  patient.  Reviewed and discussed her most recent labs reported on 06/17/2019  CBC with differential within normal limits except for eosinophil high at 0.5  CMP-glucose high at 242, ALT-high at 55, AST-within normal limits at 38 Otherwise within normal limits  Hemoglobin A1c-8.2 high  Valproic acid level-30-subtherapeutic  Lithium level-0.3-subtherapeutic  Discussed with patient to make an appointment with primary care provider to follow-up on her most recent labs which are abnormal.  Discussed with patient her Depakote can be reduced to 500 mg at bedtime.  She is currently on 1000 mg.  We can repeat her labs-CMP level-hepatic function in a few weeks from now.  In the meantime she will follow-up with her primary care doctor for further evaluation and management.  We will also fax her most recent labs to her primary care provider.  Patient agrees to call and let us know her primary care doctors information.

## 2019-08-29 ENCOUNTER — Other Ambulatory Visit: Payer: Self-pay

## 2019-08-29 ENCOUNTER — Telehealth (INDEPENDENT_AMBULATORY_CARE_PROVIDER_SITE_OTHER): Payer: BLUE CROSS/BLUE SHIELD | Admitting: Psychiatry

## 2019-08-29 ENCOUNTER — Encounter: Payer: Self-pay | Admitting: Psychiatry

## 2019-08-29 DIAGNOSIS — Z79899 Other long term (current) drug therapy: Secondary | ICD-10-CM | POA: Diagnosis not present

## 2019-08-29 DIAGNOSIS — F4010 Social phobia, unspecified: Secondary | ICD-10-CM | POA: Diagnosis not present

## 2019-08-29 DIAGNOSIS — F3178 Bipolar disorder, in full remission, most recent episode mixed: Secondary | ICD-10-CM | POA: Diagnosis not present

## 2019-08-29 DIAGNOSIS — F4312 Post-traumatic stress disorder, chronic: Secondary | ICD-10-CM

## 2019-08-29 MED ORDER — OLANZAPINE 15 MG PO TBDP: ORAL_TABLET | ORAL | 2 refills | Status: DC

## 2019-08-29 NOTE — Progress Notes (Signed)
Provider Location : [facility] Patient Location : Home  Virtual Visit via Telephone Note  I connected with Pamela Rasmussen on 08/29/19 at 11:30 AM EDT by telephone and verified that I am speaking with the correct person using two identifiers.   I discussed the limitations, risks, security and privacy concerns of performing an evaluation and management service by telephone and the availability of in person appointments. I also discussed with the patient that there may be a patient responsible charge related to this service. The patient expressed understanding and agreed to proceed.    I discussed the assessment and treatment plan with the patient. The patient was provided an opportunity to ask questions and all were answered. The patient agreed with the plan and demonstrated an understanding of the instructions.   The patient was advised to call back or seek an in-person evaluation if the symptoms worsen or if the condition fails to improve as anticipated.     [facility] OP Progress Note  08/29/2019 12:05 PM Pamela Rasmussen  MRN:  [id number]  Chief Complaint:  Chief Complaint    Follow-up     HPI: Pamela Rasmussen is a 39 year old Caucasian female, single, lives in [place], has a history of bipolar disorder, PTSD, social anxiety disorder was evaluated by phone today.  Patient was offered a video call however she preferred to do a phone call.  Patient today reports she is doing well on the current medication regimen.  Patient denies any significant mood lability.  She is tolerating the lower dosage of Depakote.  Her Depakote recently was readjusted due to abnormal liver function tests.  She reports she has upcoming appointment with her primary care provider to get follow-up labs to evaluate her liver function.  Patient reports sleep and appetite is good.  Patient denies any suicidality, homicidality or perceptual disturbances.  She reports she has upcoming job interviews  coming up.  She looks forward to that.  She continues to have good support system from her boyfriend and her mother.  Visit Diagnosis:    ICD-10-CM   1. Bipolar disorder, in full remission, most recent episode mixed (HCC)  F31.78   2. Chronic post-traumatic stress disorder (PTSD)  F43.12 olanzapine zydis (ZYPREXA) 15 MG disintegrating tablet  3. Social anxiety disorder  F40.10   4. High risk medication use  Z79.899     Past Psychiatric History: I have reviewed past psychiatric history from my progress note on 01/04/2018.  Past trials of Celexa, Lexapro, olanzapine, Wellbutrin, Depakote, risperidone  Past Medical History:  Past Medical History:  Diagnosis Date  . Anxiety   . Bipolar 1 disorder (HCC)   . Vertigo     Past Surgical History:  Procedure Laterality Date  . CHOLECYSTECTOMY      Family Psychiatric History: I have reviewed family psychiatric history from my progress note on 01/04/2018  Family History:  Family History  Problem Relation Age of Onset  . Bipolar disorder Mother   . Down syndrome Brother   . Schizophrenia Maternal Aunt   . Bipolar disorder Paternal Grandmother   . Schizophrenia Paternal Grandmother     Social History: I have reviewed social history from my progress note on 01/04/2018 Social History   Socioeconomic History  . Marital status: Significant Other    Spouse name: Not on file  . Number of children: 9  . Years of education: Not on file  . Highest education level: Associate degree: occupational, Scientist, product/process development, or vocational program  Occupational History  Comment: not employed  Tobacco Use  . Smoking status: Never Smoker  . Smokeless tobacco: Never Used  Vaping Use  . Vaping Use: Never used  Substance and Sexual Activity  . Alcohol use: No  . Drug use: No  . Sexual activity: Yes  Other Topics Concern  . Not on file  Social History Narrative  . Not on file   Social Determinants of Health   Financial Resource Strain:   . Difficulty  of Paying Living Expenses:   Food Insecurity:   . Worried About Programme researcher, broadcasting/film/video in the Last Year:   . Barista in the Last Year:   Transportation Needs:   . Freight forwarder (Medical):   Marland Kitchen Lack of Transportation (Non-Medical):   Physical Activity:   . Days of Exercise per Week:   . Minutes of Exercise per Session:   Stress:   . Feeling of Stress :   Social Connections:   . Frequency of Communication with Friends and Family:   . Frequency of Social Gatherings with Friends and Family:   . Attends Religious Services:   . Active Member of Clubs or Organizations:   . Attends Banker Meetings:   Marland Kitchen Marital Status:     Allergies:  Allergies  Allergen Reactions  . Levaquin [Levofloxacin In D5w] Itching  . Penicillins Rash    Has patient had a PCN reaction causing immediate rash, facial/tongue/throat swelling, SOB or lightheadedness with hypotension: No Has patient had a PCN reaction causing severe rash involving mucus membranes or skin necrosis: No Has patient had a PCN reaction that required hospitalization: No Has patient had a PCN reaction occurring within the last 10 years: No If all of the above answers are "NO", then may proceed with Cephalosporin use.     Metabolic Disorder Labs: Lab Results  Component Value Date   HGBA1C 8.2 (H) 06/16/2019   Lab Results  Component Value Date   PROLACTIN 6.9 01/11/2018   No results found for: CHOL, TRIG, HDL, CHOLHDL, VLDL, LDLCALC Lab Results  Component Value Date   TSH 2.530 11/05/2018    Therapeutic Level Labs: Lab Results  Component Value Date   LITHIUM 0.3 (L) 06/16/2019   LITHIUM 0.2 (L) 12/08/2018   Lab Results  Component Value Date   VALPROATE 30 (L) 06/16/2019   VALPROATE 67 11/05/2018   No components found for:  CBMZ  Current Medications: Current Outpatient Medications  Medication Sig Dispense Refill  . diclofenac (VOLTAREN) 75 MG EC tablet     . divalproex (DEPAKOTE ER) 500 MG 24  hr tablet Take 1 tablet (500 mg total) by mouth at bedtime. 180 tablet 1  . hydrOXYzine (VISTARIL) 25 MG capsule Take 1 capsule (25 mg total) by mouth 3 (three) times daily as needed. 90 capsule 1  . lithium 300 MG tablet Take 1 tablet (300 mg total) by mouth daily with breakfast. 90 tablet 1  . medroxyPROGESTERone (DEPO-PROVERA) 150 MG/ML injection every 3 (three) months    . olanzapine zydis (ZYPREXA) 15 MG disintegrating tablet DISSOLVE 1 TABLET IN MOUTH AT BEDTIME 30 tablet 2   No current facility-administered medications for this visit.     Musculoskeletal: Strength & Muscle Tone: UTA Gait & Station: UTA Patient leans: N/A  Psychiatric Specialty Exam: Review of Systems  Psychiatric/Behavioral: Negative for agitation, behavioral problems, confusion, decreased concentration, dysphoric mood, hallucinations, self-injury, sleep disturbance and suicidal ideas. The patient is not nervous/anxious and is not hyperactive.   All  other systems reviewed and are negative.   There were no vitals taken for this visit.There is no height or weight on file to calculate BMI.  General Appearance: UTA  Eye Contact:  UTA  Speech:  Clear and Coherent  Volume:  Normal  Mood:  Euthymic  Affect:  UTA  Thought Process:  Goal Directed and Descriptions of Associations: Intact  Orientation:  Full (Time, Place, and Person)  Thought Content: Logical   Suicidal Thoughts:  No  Homicidal Thoughts:  No  Memory:  Immediate;   Fair Recent;   Fair Remote;   Fair  Judgement:  Fair  Insight:  Good  Psychomotor Activity:  UTA  Concentration:  Concentration: Fair and Attention Span: Fair  Recall:  [employer] of Knowledge: Fair  Language: Fair  Akathisia:  No  Handed:  Right  AIMS (if indicated): UTA  Assets:  Communication Skills Desire for Improvement Housing Intimacy Social Support  ADL's:  Intact  Cognition: WNL  Sleep:  Fair   Screenings:   Assessment and Plan: Pamela Rasmussen is a  39 year old Caucasian female, lives in [place] with her mother, has a history of bipolar disorder, PTSD, social anxiety disorder was evaluated by phone today.  Patient is biologically predisposed given her history of trauma, family history of mental health problems.  Patient with psychosocial stressors of current pandemic, job loss however is currently doing well.  She had recent abnormal liver function and is currently tolerating the lower dosage of Depakote however will need follow-up labs for her liver function.  Plan as noted below.  Plan Bipolar disorder in full remission Lithium 300 mg p.o. daily with breakfast Lithium level-reviewed and discussed-06/16/2019-0.3-subtherapeutic. However will not readjust the dosage since her mood symptoms are stable. Continue Depakote 500 mg p.o. daily. Depakote level-06/16/2019-30-subtherapeutic. However since her liver function test was abnormal-the dosage was reduced and have advised her to follow-up with primary care provider for further testing. Zyprexa 15 mg p.o. nightly   PTSD-stable We will monitor closely  Social anxiety disorder-stable Continue hydroxyzine as needed.  High risk medication use-patient has upcoming appointment with primary care provider for abnormal liver function test.  Discussed with patient to fax the lab report to [place] once completed.  Follow-up in clinic in 2 to 3 months or sooner if needed.  I have spent atleast 20 minutes non face to face with patient today. More than 50 % of the time was spent for preparing to see the patient ( e.g., review of test, records ),  ordering medications and test ,psychoeducation and supportive psychotherapy and care coordination,as well as documenting clinical information in electronic health record. This note was generated in part or whole with voice recognition software. Voice recognition is usually quite accurate but there are transcription errors that can and very often do occur. I  apologize for any typographical errors that were not detected and corrected.      Jomarie Longs, MD 08/29/2019, 12:05 PM

## 2019-11-03 ENCOUNTER — Other Ambulatory Visit: Payer: Self-pay

## 2019-11-03 ENCOUNTER — Encounter: Payer: Self-pay | Admitting: Psychiatry

## 2019-11-03 ENCOUNTER — Telehealth (INDEPENDENT_AMBULATORY_CARE_PROVIDER_SITE_OTHER): Payer: BLUE CROSS/BLUE SHIELD | Admitting: Psychiatry

## 2019-11-03 DIAGNOSIS — F411 Generalized anxiety disorder: Secondary | ICD-10-CM | POA: Diagnosis not present

## 2019-11-03 DIAGNOSIS — F4010 Social phobia, unspecified: Secondary | ICD-10-CM | POA: Diagnosis not present

## 2019-11-03 DIAGNOSIS — Z79899 Other long term (current) drug therapy: Secondary | ICD-10-CM

## 2019-11-03 DIAGNOSIS — F3178 Bipolar disorder, in full remission, most recent episode mixed: Secondary | ICD-10-CM

## 2019-11-03 DIAGNOSIS — F4312 Post-traumatic stress disorder, chronic: Secondary | ICD-10-CM

## 2019-11-03 MED ORDER — HYDROXYZINE PAMOATE 25 MG PO CAPS: 25.0000 mg | ORAL_CAPSULE | Freq: Three times a day (TID) | ORAL | 1 refills | Status: DC | PRN

## 2019-11-03 MED ORDER — OLANZAPINE 15 MG PO TBDP: ORAL_TABLET | ORAL | 2 refills | Status: DC

## 2019-11-03 MED ORDER — PAROXETINE HCL 10 MG PO TABS: 10.0000 mg | ORAL_TABLET | Freq: Every day | ORAL | 1 refills | Status: DC

## 2019-11-03 MED ORDER — LITHIUM CARBONATE 300 MG PO TABS: 300.0000 mg | ORAL_TABLET | Freq: Every day | ORAL | 1 refills | Status: DC

## 2019-11-03 NOTE — Progress Notes (Signed)
Provider Location : [facility] Patient Location : Home  Participants: Patient , Provider  Virtual Visit via Video Note  I connected with Pamela Rasmussen on 11/03/19 at  2:20 PM EDT by a video enabled telemedicine application and verified that I am speaking with the correct person using two identifiers.   I discussed the limitations of evaluation and management by telemedicine and the availability of in person appointments. The patient expressed understanding and agreed to proceed.    I discussed the assessment and treatment plan with the patient. The patient was provided an opportunity to ask questions and all were answered. The patient agreed with the plan and demonstrated an understanding of the instructions.   The patient was advised to call back or seek an in-person evaluation if the symptoms worsen or if the condition fails to improve as anticipated.   BH MD OP Progress Note  11/03/2019 2:31 PM Pamela Rasmussen  MRN:  [id number]  Chief Complaint:  Chief Complaint    Follow-up     HPI: Pamela Rasmussen is a 39 year old Caucasian female, single, lives in [place], has a history of bipolar disorder, PTSD, social anxiety disorder, was evaluated by telemedicine today.  Patient reports she is currently going through a lot of psychosocial stressors.  She reports her mother was diagnosed with Parkinson's disease.  She is currently unemployed.  She however is looking for new jobs.  She reports due to financial problems she and her mother are planning to move in with her sister soon.  She reports she  has a lot of anxiety at this time.  She is anxious about her mother's health.  She is anxious about starting a new job and so on.  She does feel like she is nervous and on edge often.  This has been getting worse since the past few days.  She denies any manic or depressive symptoms.  She reports sleep is good.  She denies any suicidality, homicidality or perceptual  disturbances.  Patient is compliant on her medications as prescribed.  Patient denies any other concerns today.  Visit Diagnosis:    ICD-10-CM   1. Bipolar disorder, in full remission, most recent episode mixed (HCC)  F31.78 lithium 300 MG tablet  2. Chronic post-traumatic stress disorder (PTSD)  F43.12 PARoxetine (PAXIL) 10 MG tablet    hydrOXYzine (VISTARIL) 25 MG capsule    olanzapine zydis (ZYPREXA) 15 MG disintegrating tablet  3. Social anxiety disorder  F40.10 PARoxetine (PAXIL) 10 MG tablet    hydrOXYzine (VISTARIL) 25 MG capsule  4. GAD (generalized anxiety disorder)  F41.1 PARoxetine (PAXIL) 10 MG tablet  5. High risk medication use  Z79.899     Past Psychiatric History: I have reviewed past psychiatric history from my progress note on 01/04/2018.  Past trials of Celexa, Lexapro, olanzapine, Wellbutrin, Depakote, risperidone  Past Medical History:  Past Medical History:  Diagnosis Date  . Anxiety   . Bipolar 1 disorder (HCC)   . Vertigo     Past Surgical History:  Procedure Laterality Date  . CHOLECYSTECTOMY      Family Psychiatric History: I have reviewed family psychiatric history from my progress note on 01/04/2018  Family History:  Family History  Problem Relation Age of Onset  . Bipolar disorder Mother   . Down syndrome Brother   . Schizophrenia Maternal Aunt   . Bipolar disorder Paternal Grandmother   . Schizophrenia Paternal Grandmother     Social History: Reviewed social history from my progress note on  01/04/2018 Social History   Socioeconomic History  . Marital status: Significant Other    Spouse name: Not on file  . Number of children: 9  . Years of education: Not on file  . Highest education level: Associate degree: occupational, Scientist, product/process development, or vocational program  Occupational History    Comment: not employed  Tobacco Use  . Smoking status: Never Smoker  . Smokeless tobacco: Never Used  Vaping Use  . Vaping Use: Never used  Substance and  Sexual Activity  . Alcohol use: No  . Drug use: No  . Sexual activity: Yes  Other Topics Concern  . Not on file  Social History Narrative  . Not on file   Social Determinants of Health   Financial Resource Strain:   . Difficulty of Paying Living Expenses: Not on file  Food Insecurity:   . Worried About Programme researcher, broadcasting/film/video in the Last Year: Not on file  . Ran Out of Food in the Last Year: Not on file  Transportation Needs:   . Lack of Transportation (Medical): Not on file  . Lack of Transportation (Non-Medical): Not on file  Physical Activity:   . Days of Exercise per Week: Not on file  . Minutes of Exercise per Session: Not on file  Stress:   . Feeling of Stress : Not on file  Social Connections:   . Frequency of Communication with Friends and Family: Not on file  . Frequency of Social Gatherings with Friends and Family: Not on file  . Attends Religious Services: Not on file  . Active Member of Clubs or Organizations: Not on file  . Attends Banker Meetings: Not on file  . Marital Status: Not on file    Allergies:  Allergies  Allergen Reactions  . Levaquin [Levofloxacin In D5w] Itching  . Penicillins Rash    Has patient had a PCN reaction causing immediate rash, facial/tongue/throat swelling, SOB or lightheadedness with hypotension: No Has patient had a PCN reaction causing severe rash involving mucus membranes or skin necrosis: No Has patient had a PCN reaction that required hospitalization: No Has patient had a PCN reaction occurring within the last 10 years: No If all of the above answers are "NO", then may proceed with Cephalosporin use.     Metabolic Disorder Labs: Lab Results  Component Value Date   HGBA1C 8.2 (H) 06/16/2019   Lab Results  Component Value Date   PROLACTIN 6.9 01/11/2018   No results found for: CHOL, TRIG, HDL, CHOLHDL, VLDL, LDLCALC Lab Results  Component Value Date   TSH 2.530 11/05/2018    Therapeutic Level Labs: Lab  Results  Component Value Date   LITHIUM 0.3 (L) 06/16/2019   LITHIUM 0.2 (L) 12/08/2018   Lab Results  Component Value Date   VALPROATE 30 (L) 06/16/2019   VALPROATE 67 11/05/2018   No components found for:  CBMZ  Current Medications: Current Outpatient Medications  Medication Sig Dispense Refill  . NEOMYCIN-POLYMYXIN-HYDROCORTISONE (CORTISPORIN) 1 % SOLN OTIC solution INSTILL 3 DROPS INTO RIGHT EAR 4 TIMES DAILY FOR 10 DAYS    . diclofenac (VOLTAREN) 75 MG EC tablet     . divalproex (DEPAKOTE ER) 500 MG 24 hr tablet Take 1 tablet (500 mg total) by mouth at bedtime. 180 tablet 1  . hydrOXYzine (VISTARIL) 25 MG capsule Take 1 capsule (25 mg total) by mouth 3 (three) times daily as needed. 90 capsule 1  . lithium 300 MG tablet Take 1 tablet (300 mg total)  by mouth daily with breakfast. 90 tablet 1  . medroxyPROGESTERone (DEPO-PROVERA) 150 MG/ML injection every 3 (three) months    . olanzapine zydis (ZYPREXA) 15 MG disintegrating tablet DISSOLVE 1 TABLET IN MOUTH AT BEDTIME 30 tablet 2  . PARoxetine (PAXIL) 10 MG tablet Take 1 tablet (10 mg total) by mouth daily. 30 tablet 1   No current facility-administered medications for this visit.     Musculoskeletal: Strength & Muscle Tone: UTA Gait & Station: normal Patient leans: N/A  Psychiatric Specialty Exam: Review of Systems  Psychiatric/Behavioral: The patient is nervous/anxious.   All other systems reviewed and are negative.   There were no vitals taken for this visit.There is no height or weight on file to calculate BMI.  General Appearance: Casual  Eye Contact:  Fair  Speech:  Normal Rate  Volume:  Normal  Mood:  Anxious  Affect:  Congruent  Thought Process:  Goal Directed and Descriptions of Associations: Intact  Orientation:  Full (Time, Place, and Person)  Thought Content: Logical   Suicidal Thoughts:  No  Homicidal Thoughts:  No  Memory:  Immediate;   Fair Recent;   Fair Remote;   Fair  Judgement:  Fair   Insight:  Fair  Psychomotor Activity:  Normal  Concentration:  Concentration: Fair and Attention Span: Fair  Recall:  [employer] of Knowledge: Fair  Language: Fair  Akathisia:  No  Handed:  Right  AIMS (if indicated): UTA  Assets:  Communication Skills Desire for Improvement Housing Social Support  ADL's:  Intact  Cognition: WNL  Sleep:  Fair   Screenings:   Assessment and Plan: Pamela Rasmussen is a 39 year old Caucasian female, lives in [place], has a history of bipolar disorder, PTSD, social anxiety was evaluated by telemedicine today.  She is biologically predisposed given her history of trauma, family history of mental health problems.  Patient with psychosocial stressors of the current pandemic, job related stressors, mother's health issues.  Patient will continue to benefit from medication readjustment and psychotherapy sessions.  Plan as noted below.  Plan Bipolar disorder in full remission Lithium 300 mg p.o. daily with breakfast Lithium level-06/16/2019-0.3-subtherapeutic. She will benefit from repeat lithium level. Continue Depakote 500 mg p.o. daily Depakote level-06/16/2018 21-30-subtherapeutic. Patient with abnormal liver function test in the past, Depakote dosage was reduced for the same.  We will continue the same. Zyprexa 15 mg p.o. nightly  PTSD-stable Will monitor closely  Social anxiety disorder-stable Continue hydroxyzine as needed  GAD-unstable Start Paxil 10 mg p.o. daily Continue hydroxyzine as needed Discussed referral for CBT-patient declined  High risk medication use-she will benefit from repeat lithium level.  Will fax it to [facility] lab.  Follow-up in clinic in 1 month or sooner if needed.  I have spent atleast 30 minutes face to face by video with patient today. More than 50 % of the time was spent for preparing to see the patient ( e.g., review of test, records ), obtaining and to review and separately obtained history , ordering  medications and test ,psychoeducation and supportive psychotherapy and care coordination,as well as documenting clinical information in electronic health record. This note was generated in part or whole with voice recognition software. Voice recognition is usually quite accurate but there are transcription errors that can and very often do occur. I apologize for any typographical errors that were not detected and corrected.         Jomarie Longs, MD 11/03/2019, 2:31 PM

## 2019-11-03 NOTE — Patient Instructions (Signed)
Paroxetine tablets What is this medicine? PAROXETINE (pa ROX e teen) is used to treat depression. It may also be used to treat anxiety disorders, obsessive compulsive disorder, panic attacks, post traumatic stress, and premenstrual dysphoric disorder (PMDD). This medicine may be used for other purposes; ask your health care provider or pharmacist if you have questions. COMMON BRAND NAME(S): Paxil, Pexeva What should I tell my health care provider before I take this medicine? They need to know if you have any of these conditions:  bipolar disorder or a family history of bipolar disorder  bleeding disorders  glaucoma  heart disease  kidney disease  liver disease  low levels of sodium in the blood  seizures  suicidal thoughts, plans, or attempt; a previous suicide attempt by you or a family member  take MAOIs like Carbex, Eldepryl, Marplan, Nardil, and Parnate  take medicines that treat or prevent blood clots  thyroid disease  an unusual or allergic reaction to paroxetine, other medicines, foods, dyes, or preservatives  pregnant or trying to get pregnant  breast-feeding How should I use this medicine? Take this medicine by mouth with a glass of water. Follow the directions on the prescription label. You can take it with or without food. Take your medicine at regular intervals. Do not take your medicine more often than directed. Do not stop taking this medicine suddenly except upon the advice of your doctor. Stopping this medicine too quickly may cause serious side effects or your condition may worsen. A special MedGuide will be given to you by the pharmacist with each prescription and refill. Be sure to read this information carefully each time. Talk to your pediatrician regarding the use of this medicine in children. Special care may be needed. Overdosage: If you think you have taken too much of this medicine contact a poison control center or emergency room at once. NOTE:  This medicine is only for you. Do not share this medicine with others. What if I miss a dose? If you miss a dose, take it as soon as you can. If it is almost time for your next dose, take only that dose. Do not take double or extra doses. What may interact with this medicine? Do not take this medicine with any of the following medications:  linezolid  MAOIs like Carbex, Eldepryl, Marplan, Nardil, and Parnate  methylene blue (injected into a vein)  pimozide  thioridazine This medicine may also interact with the following medications:  alcohol  amphetamines  aspirin and aspirin-like medicines  atomoxetine  certain medicines for depression, anxiety, or psychotic disturbances  certain medicines for irregular heart beat like propafenone, flecainide, encainide, and quinidine  certain medicines for migraine headache like almotriptan, eletriptan, frovatriptan, naratriptan, rizatriptan, sumatriptan, zolmitriptan  cimetidine  digoxin  diuretics  fentanyl  fosamprenavir  furazolidone  isoniazid  lithium  medicines that treat or prevent blood clots like warfarin, enoxaparin, and dalteparin  medicines for sleep  NSAIDs, medicines for pain and inflammation, like ibuprofen or naproxen  phenobarbital  phenytoin  procarbazine  rasagiline  ritonavir  supplements like St. John's wort, kava kava, valerian  tamoxifen  tramadol  tryptophan This list may not describe all possible interactions. Give your health care provider a list of all the medicines, herbs, non-prescription drugs, or dietary supplements you use. Also tell them if you smoke, drink alcohol, or use illegal drugs. Some items may interact with your medicine. What should I watch for while using this medicine? Tell your doctor if your symptoms do not   get better or if they get worse. Visit your doctor or health care professional for regular checks on your progress. Because it may take several weeks to see  the full effects of this medicine, it is important to continue your treatment as prescribed by your doctor. Patients and their families should watch out for new or worsening thoughts of suicide or depression. Also watch out for sudden changes in feelings such as feeling anxious, agitated, panicky, irritable, hostile, aggressive, impulsive, severely restless, overly excited and hyperactive, or not being able to sleep. If this happens, especially at the beginning of treatment or after a change in dose, call your health care professional. You may get drowsy or dizzy. Do not drive, use machinery, or do anything that needs mental alertness until you know how this medicine affects you. Do not stand or sit up quickly, especially if you are an older patient. This reduces the risk of dizzy or fainting spells. Alcohol may interfere with the effect of this medicine. Avoid alcoholic drinks. Your mouth may get dry. Chewing sugarless gum or sucking hard candy, and drinking plenty of water will help. Contact your doctor if the problem does not go away or is severe. What side effects may I notice from receiving this medicine? Side effects that you should report to your doctor or health care professional as soon as possible:  allergic reactions like skin rash, itching or hives, swelling of the face, lips, or tongue  anxious  black, tarry stools  changes in vision  confusion  elevated mood, decreased need for sleep, racing thoughts, impulsive behavior  eye pain  fast, irregular heartbeat  feeling faint or lightheaded, falls  feeling agitated, angry, or irritable  hallucination, loss of contact with reality  loss of balance or coordination  loss of memory  painful or prolonged erections  restlessness, pacing, inability to keep still  seizures  stiff muscles  suicidal thoughts or other mood changes  trouble sleeping  unusual bleeding or bruising  unusually weak or tired  vomiting Side  effects that usually do not require medical attention (report to your doctor or health care professional if they continue or are bothersome):  change in appetite or weight  change in sex drive or performance  diarrhea  dizziness  dry mouth  increased sweating  indigestion, nausea  tired  tremors This list may not describe all possible side effects. Call your doctor for medical advice about side effects. You may report side effects to FDA at 1-800-FDA-1088. Where should I keep my medicine? Keep out of the reach of children. Store at room temperature between 15 and 30 degrees C (59 and 86 degrees F). Keep container tightly closed. Throw away any unused medicine after the expiration date. NOTE: This sheet is a summary. It may not cover all possible information. If you have questions about this medicine, talk to your doctor, pharmacist, or health care provider.  2020 Elsevier/Gold Standard (2015-06-23 15:50:32)  

## 2019-11-09 ENCOUNTER — Telehealth: Payer: Self-pay | Admitting: Psychiatry

## 2019-11-09 NOTE — Telephone Encounter (Signed)
mailed out labwork orders for cmp and liver, and lithium levels

## 2019-11-09 NOTE — Telephone Encounter (Signed)
Printed out lithium level, BMP-we will have Shanda Bumps CMA mailed to her as requested.

## 2019-11-09 NOTE — Telephone Encounter (Signed)
Correction-printed out lithium level, CMP-liver.  Will mail it to patient.

## 2019-11-30 ENCOUNTER — Telehealth: Payer: Self-pay

## 2019-11-30 DIAGNOSIS — F411 Generalized anxiety disorder: Secondary | ICD-10-CM

## 2019-11-30 DIAGNOSIS — F3178 Bipolar disorder, in full remission, most recent episode mixed: Secondary | ICD-10-CM

## 2019-11-30 DIAGNOSIS — F4010 Social phobia, unspecified: Secondary | ICD-10-CM

## 2019-11-30 DIAGNOSIS — F4312 Post-traumatic stress disorder, chronic: Secondary | ICD-10-CM

## 2019-11-30 MED ORDER — PAROXETINE HCL 20 MG PO TABS: 20.0000 mg | ORAL_TABLET | Freq: Every day | ORAL | 1 refills | Status: DC

## 2019-11-30 NOTE — Telephone Encounter (Signed)
Returned call to patient.  She is interested in increasing her Paxil.  She is struggling with a lot of anxiety.  Will increase Paxil to 20 mg p.o. daily.  Will add hydroxyzine as needed, she has it available however has not used it.

## 2019-11-30 NOTE — Telephone Encounter (Signed)
pt called left message to see if she can increase her medication she has increased anxiety

## 2019-12-01 LAB — CMP AND LIVER
ALT: 44 IU/L — ABNORMAL HIGH (ref 0–32)
AST: 32 IU/L (ref 0–40)
Albumin: 4.4 g/dL (ref 3.8–4.8)
Alkaline Phosphatase: 101 IU/L (ref 44–121)
BUN: 9 mg/dL (ref 6–20)
Bilirubin Total: 0.4 mg/dL (ref 0.0–1.2)
Bilirubin, Direct: <0.1 mg/dL (ref 0.00–0.40)
CO2: 21 mmol/L (ref 20–29)
Calcium: 9.7 mg/dL (ref 8.7–10.2)
Chloride: 102 mmol/L (ref 96–106)
Creatinine, Ser: 1.04 mg/dL — ABNORMAL HIGH (ref 0.57–1.00)
GFR calc Af Amer: 79 mL/min/{1.73_m2} (ref >59)
GFR calc non Af Amer: 68 mL/min/{1.73_m2} (ref >59)
Glucose: 166 mg/dL — ABNORMAL HIGH (ref 65–99)
Potassium: 4 mmol/L (ref 3.5–5.2)
Sodium: 139 mmol/L (ref 134–144)
Total Protein: 7.4 g/dL (ref 6.0–8.5)

## 2019-12-01 LAB — LITHIUM LEVEL: Lithium Lvl: 0.2 mmol/L — ABNORMAL LOW (ref 0.5–1.2)

## 2019-12-02 ENCOUNTER — Telehealth: Payer: Self-pay | Admitting: Psychiatry

## 2019-12-02 NOTE — Telephone Encounter (Signed)
Called patient.  Discussed most recent labs.  ALT slightly elevated however downtrending compared to previous results. Creatinine elevated.  Advised patient she could contact her primary care provider for further management. Will not make any medication changes today.

## 2019-12-05 ENCOUNTER — Encounter: Payer: Self-pay | Admitting: Psychiatry

## 2019-12-05 ENCOUNTER — Telehealth (INDEPENDENT_AMBULATORY_CARE_PROVIDER_SITE_OTHER): Payer: BLUE CROSS/BLUE SHIELD | Admitting: Psychiatry

## 2019-12-05 ENCOUNTER — Other Ambulatory Visit: Payer: Self-pay

## 2019-12-05 ENCOUNTER — Telehealth: Payer: Self-pay

## 2019-12-05 DIAGNOSIS — F4010 Social phobia, unspecified: Secondary | ICD-10-CM | POA: Diagnosis not present

## 2019-12-05 DIAGNOSIS — F411 Generalized anxiety disorder: Secondary | ICD-10-CM | POA: Diagnosis not present

## 2019-12-05 DIAGNOSIS — F3178 Bipolar disorder, in full remission, most recent episode mixed: Secondary | ICD-10-CM | POA: Diagnosis not present

## 2019-12-05 DIAGNOSIS — F4312 Post-traumatic stress disorder, chronic: Secondary | ICD-10-CM | POA: Diagnosis not present

## 2019-12-05 MED ORDER — DIVALPROEX SODIUM ER 500 MG PO TB24: 500.0000 mg | ORAL_TABLET | Freq: Every day | ORAL | 1 refills | Status: DC

## 2019-12-05 NOTE — Progress Notes (Signed)
Virtual Visit via Telephone Note  I connected with Pamela Rasmussen on 12/05/19 at 11:30 AM EDT by telephone and verified that I am speaking with the correct person using two identifiers.  Location Provider Location : [facility] Patient Location : Home  Participants: Patient , Provider    I discussed the limitations, risks, security and privacy concerns of performing an evaluation and management service by telephone and the availability of in person appointments. I also discussed with the patient that there may be a patient responsible charge related to this service. The patient expressed understanding and agreed to proceed.     I discussed the assessment and treatment plan with the patient. The patient was provided an opportunity to ask questions and all were answered. The patient agreed with the plan and demonstrated an understanding of the instructions.   The patient was advised to call back or seek an in-person evaluation if the symptoms worsen or if the condition fails to improve as anticipated.   BH MD OP Progress Note  12/06/2019 8:29 AM Windy Dudek  MRN:  [id number]  Chief Complaint:  Chief Complaint    Follow-up     HPI: Pamela Rasmussen is a 39 year old Caucasian female, single, lives in [place], has a history of bipolar disorder, PTSD, social anxiety disorder was evaluated by phone today.  Patient today reports that she is currently struggling with anxiety symptoms.  She reports she has not been able to go up on the Paxil as discussed few days ago to 20 mg.  She just got the prescription from the pharmacy and plans to go up on the dosage.  So far she is tolerating the Paxil well.  Patient denies any significant manic or depressive symptoms at this time.  She reports sleep was going okay.  She is compliant on her other medications as prescribed and denies side effects.  Patient reports she has not been able to get in touch with her primary care  provider to discuss her elevated liver function and creatinine level however agrees to do so.  Patient denies any suicidality, homicidality or perceptual disturbances.  Patient denies any other concerns today.  Visit Diagnosis:    ICD-10-CM   1. Bipolar disorder, in full remission, most recent episode mixed (HCC)  F31.78   2. Chronic post-traumatic stress disorder (PTSD)  F43.12 divalproex (DEPAKOTE ER) 500 MG 24 hr tablet  3. Social anxiety disorder  F40.10 divalproex (DEPAKOTE ER) 500 MG 24 hr tablet  4. GAD (generalized anxiety disorder)  F41.1     Past Psychiatric History: I have reviewed past psychiatric history from my progress note on 01/04/2018.  Past trials of Celexa, Lexapro, olanzapine, Wellbutrin, Depakote, risperidone  Past Medical History:  Past Medical History:  Diagnosis Date  . Anxiety   . Bipolar 1 disorder (HCC)   . Vertigo     Past Surgical History:  Procedure Laterality Date  . CHOLECYSTECTOMY      Family Psychiatric History: I have reviewed family psychiatric history from my progress note on 01/04/2018  Family History:  Family History  Problem Relation Age of Onset  . Bipolar disorder Mother   . Down syndrome Brother   . Schizophrenia Maternal Aunt   . Bipolar disorder Paternal Grandmother   . Schizophrenia Paternal Grandmother     Social History: Reviewed social history from my progress note on 01/04/2018 Social History   Socioeconomic History  . Marital status: Significant Other    Spouse name: Not on file  .  Number of children: 9  . Years of education: Not on file  . Highest education level: Associate degree: occupational, Scientist, product/process development, or vocational program  Occupational History    Comment: not employed  Tobacco Use  . Smoking status: Never Smoker  . Smokeless tobacco: Never Used  Vaping Use  . Vaping Use: Never used  Substance and Sexual Activity  . Alcohol use: No  . Drug use: No  . Sexual activity: Yes  Other Topics Concern  . Not on  file  Social History Narrative  . Not on file   Social Determinants of Health   Financial Resource Strain:   . Difficulty of Paying Living Expenses: Not on file  Food Insecurity:   . Worried About Programme researcher, broadcasting/film/video in the Last Year: Not on file  . Ran Out of Food in the Last Year: Not on file  Transportation Needs:   . Lack of Transportation (Medical): Not on file  . Lack of Transportation (Non-Medical): Not on file  Physical Activity:   . Days of Exercise per Week: Not on file  . Minutes of Exercise per Session: Not on file  Stress:   . Feeling of Stress : Not on file  Social Connections:   . Frequency of Communication with Friends and Family: Not on file  . Frequency of Social Gatherings with Friends and Family: Not on file  . Attends Religious Services: Not on file  . Active Member of Clubs or Organizations: Not on file  . Attends Banker Meetings: Not on file  . Marital Status: Not on file    Allergies:  Allergies  Allergen Reactions  . Levaquin [Levofloxacin In D5w] Itching  . Penicillins Rash    Has patient had a PCN reaction causing immediate rash, facial/tongue/throat swelling, SOB or lightheadedness with hypotension: No Has patient had a PCN reaction causing severe rash involving mucus membranes or skin necrosis: No Has patient had a PCN reaction that required hospitalization: No Has patient had a PCN reaction occurring within the last 10 years: No If all of the above answers are "NO", then may proceed with Cephalosporin use.     Metabolic Disorder Labs: Lab Results  Component Value Date   HGBA1C 8.2 (H) 06/16/2019   Lab Results  Component Value Date   PROLACTIN 6.9 01/11/2018   No results found for: CHOL, TRIG, HDL, CHOLHDL, VLDL, LDLCALC Lab Results  Component Value Date   TSH 2.530 11/05/2018    Therapeutic Level Labs: Lab Results  Component Value Date   LITHIUM 0.2 (L) 11/30/2019   LITHIUM 0.3 (L) 06/16/2019   Lab Results   Component Value Date   VALPROATE 30 (L) 06/16/2019   VALPROATE 67 11/05/2018   No components found for:  CBMZ  Current Medications: Current Outpatient Medications  Medication Sig Dispense Refill  . diclofenac (VOLTAREN) 75 MG EC tablet     . divalproex (DEPAKOTE ER) 500 MG 24 hr tablet Take 1 tablet (500 mg total) by mouth at bedtime. 90 tablet 1  . hydrOXYzine (VISTARIL) 25 MG capsule Take 1 capsule (25 mg total) by mouth 3 (three) times daily as needed. 90 capsule 1  . lithium 300 MG tablet Take 1 tablet (300 mg total) by mouth daily with breakfast. 90 tablet 1  . medroxyPROGESTERone (DEPO-PROVERA) 150 MG/ML injection every 3 (three) months    . NEOMYCIN-POLYMYXIN-HYDROCORTISONE (CORTISPORIN) 1 % SOLN OTIC solution INSTILL 3 DROPS INTO RIGHT EAR 4 TIMES DAILY FOR 10 DAYS    .  olanzapine zydis (ZYPREXA) 15 MG disintegrating tablet DISSOLVE 1 TABLET IN MOUTH AT BEDTIME 30 tablet 2  . PARoxetine (PAXIL) 20 MG tablet Take 1 tablet (20 mg total) by mouth daily. 30 tablet 1   No current facility-administered medications for this visit.     Musculoskeletal: Strength & Muscle Tone: UTA Gait & Station: UTA Patient leans: N/A  Psychiatric Specialty Exam: Review of Systems  Psychiatric/Behavioral: The patient is nervous/anxious.   All other systems reviewed and are negative.   There were no vitals taken for this visit.There is no height or weight on file to calculate BMI.  General Appearance: UTA  Eye Contact:  UTA  Speech:  Clear and Coherent  Volume:  Normal  Mood:  Anxious  Affect:  UTA  Thought Process:  Goal Directed and Descriptions of Associations: Intact  Orientation:  Full (Time, Place, and Person)  Thought Content: Logical   Suicidal Thoughts:  No  Homicidal Thoughts:  No  Memory:  Immediate;   Fair Recent;   Fair Remote;   Fair  Judgement:  Fair  Insight:  Fair  Psychomotor Activity:  UTA  Concentration:  Concentration: Fair and Attention Span: Fair  Recall:   [employer] of Knowledge: Fair  Language: Fair  Akathisia:  No  Handed:  Right  AIMS (if indicated): UTA  Assets:  Communication Skills Desire for Improvement Housing Social Support  ADL's:  Intact  Cognition: WNL  Sleep:  Fair   Screenings:   Assessment and Plan: Pamela Rasmussen is a 39 year old Caucasian female, lives in [place], has a history of bipolar disorder, PTSD, social anxiety was evaluated by telemedicine today.  Patient is biologically predisposed given her history of trauma, family history of mental health problems.  Patient with psychosocial stressors of current pandemic, job related stressors, mother's health issues.  Patient continues to struggle with anxiety symptoms.  Plan as noted below.  Plan Bipolar disorder in full remission Lithium 300 mg p.o. daily with breakfast Lithium level on 11/30/2019-0.2-subtherapeutic. Creatinine slightly elevated at 1.04. Continue Depakote 500 mg p.o. daily. Depakote level-06/16/2019-30-subtherapeutic Depakote dosage was reduced due to elevated liver function in the past. ALT elevated at 44 dated 11/30/2019 however downtrending compared to previous levels.  AST within normal limits.  PTSD-stable We will monitor closely  Social anxiety disorder-stable Hydroxyzine as needed  GAD-unstable Increase Paxil to 20 mg p.o. daily. Patient was advised to follow-up with therapist in the past however she declined.  Patient advised to follow-up with primary care provider for elevated creatinine as well as ALT level.  Follow-up in clinic in 4 weeks or sooner if needed.  I have spent atleast 20 minutes non face to face  with patient today. More than 50 % of the time was spent for preparing to see the patient ( e.g., review of test, records ), ordering medications and test ,psychoeducation and supportive psychotherapy and care coordination,as well as documenting clinical information in electronic health record,interpreting and  communication of test results. This note was generated in part or whole with voice recognition software. Voice recognition is usually quite accurate but there are transcription errors that can and very often do occur. I apologize for any typographical errors that were not detected and corrected.       Jomarie Longs, MD 12/06/2019, 8:29 AM

## 2019-12-05 NOTE — Telephone Encounter (Signed)
I ALREADY EVALUATED HER TODAY SINCE I DID NOT GET THIS MESSAGE UNTIL NOW.SHE ALSO HAS ANOTHER APPOINTMENT FOR NEXT MONTH.

## 2019-12-05 NOTE — Telephone Encounter (Signed)
Appointment - Patient left a message over the weekend she would not be able to keep 11:30am appointment 12/05/19 and requested to be called to reschedule.

## 2020-01-04 ENCOUNTER — Encounter: Payer: Self-pay | Admitting: Psychiatry

## 2020-01-04 ENCOUNTER — Other Ambulatory Visit: Payer: Self-pay

## 2020-01-04 ENCOUNTER — Telehealth (INDEPENDENT_AMBULATORY_CARE_PROVIDER_SITE_OTHER): Payer: BLUE CROSS/BLUE SHIELD | Admitting: Psychiatry

## 2020-01-04 DIAGNOSIS — F4010 Social phobia, unspecified: Secondary | ICD-10-CM

## 2020-01-04 DIAGNOSIS — F411 Generalized anxiety disorder: Secondary | ICD-10-CM | POA: Diagnosis not present

## 2020-01-04 DIAGNOSIS — F4312 Post-traumatic stress disorder, chronic: Secondary | ICD-10-CM

## 2020-01-04 DIAGNOSIS — F3178 Bipolar disorder, in full remission, most recent episode mixed: Secondary | ICD-10-CM

## 2020-01-04 MED ORDER — LITHIUM CARBONATE 300 MG PO TABS: 300.0000 mg | ORAL_TABLET | Freq: Every day | ORAL | 1 refills | Status: DC

## 2020-01-04 MED ORDER — PAROXETINE HCL 40 MG PO TABS: 40.0000 mg | ORAL_TABLET | ORAL | 1 refills | Status: DC

## 2020-01-04 MED ORDER — HYDROXYZINE PAMOATE 25 MG PO CAPS: 25.0000 mg | ORAL_CAPSULE | Freq: Three times a day (TID) | ORAL | 1 refills | Status: DC | PRN

## 2020-01-04 NOTE — Progress Notes (Signed)
Virtual Visit via Video Note  I connected with Pamela Rasmussen on 01/04/20 at  9:00 AM EST by a video enabled telemedicine application and verified that I am speaking with the correct person using two identifiers.  Location Provider Location : [facility] Patient Location : Home  Participants: Patient , Provider   I discussed the limitations of evaluation and management by telemedicine and the availability of in person appointments. The patient expressed understanding and agreed to proceed.  I discussed the assessment and treatment plan with the patient. The patient was provided an opportunity to ask questions and all were answered. The patient agreed with the plan and demonstrated an understanding of the instructions.   The patient was advised to call back or seek an in-person evaluation if the symptoms worsen or if the condition fails to improve as anticipated.   BH MD OP Progress Note  01/04/2020 9:19 AM Pamela Rasmussen  MRN:  [id number]  Chief Complaint:  Chief Complaint    Follow-up     HPI: Pamela Rasmussen is a 39 year old Caucasian female, single, lives in [place], has a history of bipolar disorder, PTSD, social anxiety disorder was evaluated by telemedicine today.  Patient reports she continues to struggle with anxiety symptoms usually triggered by situational stressors.  She reports when she has these anxiety symptoms she feels flushed and is restless.  She reports she is currently on a higher dosage of Paxil since the past 3 weeks.  She is tolerating it well.  She also uses hydroxyzine as needed which helps to some extent.  She is currently on 25 mg.  She reports sleep as good.  Patient denies any suicidality, homicidality or perceptual disturbances.  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  She had a good Thanksgiving holiday with family.  She denies any other concerns  today.  Visit Diagnosis:    ICD-10-CM   1. Bipolar disorder, in full remission, most recent episode mixed (HCC)  F31.78 lithium 300 MG tablet  2. Chronic post-traumatic stress disorder (PTSD)  F43.12 PARoxetine (PAXIL) 40 MG tablet    hydrOXYzine (VISTARIL) 25 MG capsule  3. Social anxiety disorder  F40.10 PARoxetine (PAXIL) 40 MG tablet    hydrOXYzine (VISTARIL) 25 MG capsule  4. GAD (generalized anxiety disorder)  F41.1 PARoxetine (PAXIL) 40 MG tablet    Past Psychiatric History: I have reviewed past psychiatric history from my progress note on 01/04/2018.  Past trials of Celexa, Lexapro, olanzapine, Wellbutrin, Depakote, risperidone  Past Medical History:  Past Medical History:  Diagnosis Date  . Anxiety   . Bipolar 1 disorder (HCC)   . Vertigo     Past Surgical History:  Procedure Laterality Date  . CHOLECYSTECTOMY      Family Psychiatric History: I have reviewed family psychiatric history from my progress note on 01/04/2018  Family History:  Family History  Problem Relation Age of Onset  . Bipolar disorder Mother   . Down syndrome Brother   . Schizophrenia Maternal Aunt   . Bipolar disorder Paternal Grandmother   . Schizophrenia Paternal Grandmother     Social History: Reviewed social history from my progress note on 01/04/2018 Social History   Socioeconomic History  . Marital status: Significant Other    Spouse name: Not on file  . Number of children: 9  . Years of education: Not on file  . Highest education level: Associate degree: occupational, technical, or  vocational program  Occupational History    Comment: not employed  Tobacco Use  . Smoking status: Never Smoker  . Smokeless tobacco: Never Used  Vaping Use  . Vaping Use: Never used  Substance and Sexual Activity  . Alcohol use: No  . Drug use: No  . Sexual activity: Yes  Other Topics Concern  . Not on file  Social History Narrative  . Not on file   Social Determinants of Health   Financial  Resource Strain:   . Difficulty of Paying Living Expenses: Not on file  Food Insecurity:   . Worried About Programme researcher, broadcasting/film/video in the Last Year: Not on file  . Ran Out of Food in the Last Year: Not on file  Transportation Needs:   . Lack of Transportation (Medical): Not on file  . Lack of Transportation (Non-Medical): Not on file  Physical Activity:   . Days of Exercise per Week: Not on file  . Minutes of Exercise per Session: Not on file  Stress:   . Feeling of Stress : Not on file  Social Connections:   . Frequency of Communication with Friends and Family: Not on file  . Frequency of Social Gatherings with Friends and Family: Not on file  . Attends Religious Services: Not on file  . Active Member of Clubs or Organizations: Not on file  . Attends Banker Meetings: Not on file  . Marital Status: Not on file    Allergies:  Allergies  Allergen Reactions  . Levaquin [Levofloxacin In D5w] Itching  . Penicillins Rash    Has patient had a PCN reaction causing immediate rash, facial/tongue/throat swelling, SOB or lightheadedness with hypotension: No Has patient had a PCN reaction causing severe rash involving mucus membranes or skin necrosis: No Has patient had a PCN reaction that required hospitalization: No Has patient had a PCN reaction occurring within the last 10 years: No If all of the above answers are "NO", then may proceed with Cephalosporin use.     Metabolic Disorder Labs: Lab Results  Component Value Date   HGBA1C 8.2 (H) 06/16/2019   Lab Results  Component Value Date   PROLACTIN 6.9 01/11/2018   No results found for: CHOL, TRIG, HDL, CHOLHDL, VLDL, LDLCALC Lab Results  Component Value Date   TSH 2.530 11/05/2018    Therapeutic Level Labs: Lab Results  Component Value Date   LITHIUM 0.2 (L) 11/30/2019   LITHIUM 0.3 (L) 06/16/2019   Lab Results  Component Value Date   VALPROATE 30 (L) 06/16/2019   VALPROATE 67 11/05/2018   No components  found for:  CBMZ  Current Medications: Current Outpatient Medications  Medication Sig Dispense Refill  . diclofenac (VOLTAREN) 75 MG EC tablet     . divalproex (DEPAKOTE ER) 500 MG 24 hr tablet Take 1 tablet (500 mg total) by mouth at bedtime. 90 tablet 1  . hydrOXYzine (VISTARIL) 25 MG capsule Take 1 capsule (25 mg total) by mouth 3 (three) times daily as needed. 90 capsule 1  . lithium 300 MG tablet Take 1 tablet (300 mg total) by mouth daily with breakfast. 90 tablet 1  . medroxyPROGESTERone (DEPO-PROVERA) 150 MG/ML injection every 3 (three) months    . NEOMYCIN-POLYMYXIN-HYDROCORTISONE (CORTISPORIN) 1 % SOLN OTIC solution INSTILL 3 DROPS INTO RIGHT EAR 4 TIMES DAILY FOR 10 DAYS    . olanzapine zydis (ZYPREXA) 15 MG disintegrating tablet DISSOLVE 1 TABLET IN MOUTH AT BEDTIME 30 tablet 2  . PARoxetine (PAXIL) 40  MG tablet Take 1 tablet (40 mg total) by mouth every morning. 30 tablet 1   No current facility-administered medications for this visit.     Musculoskeletal: Strength & Muscle Tone: UTA Gait & Station: UTA Patient leans: N/A  Psychiatric Specialty Exam: Review of Systems  Musculoskeletal:       Left sided Thigh- pain  Psychiatric/Behavioral: The patient is nervous/anxious.   All other systems reviewed and are negative.   There were no vitals taken for this visit.There is no height or weight on file to calculate BMI.  General Appearance: Casual  Eye Contact:  Fair  Speech:  Clear and Coherent  Volume:  Normal  Mood:  Anxious  Affect:  Congruent  Thought Process:  Goal Directed and Descriptions of Associations: Intact  Orientation:  Full (Time, Place, and Person)  Thought Content: Logical   Suicidal Thoughts:  No  Homicidal Thoughts:  No  Memory:  Immediate;   Fair Recent;   Fair Remote;   Fair  Judgement:  Fair  Insight:  Fair  Psychomotor Activity:  Normal  Concentration:  Concentration: Fair and Attention Span: Fair  Recall:  [employer] of Knowledge: Fair   Language: Fair  Akathisia:  No  Handed:  Right  AIMS (if indicated): UTA  Assets:  Communication Skills Desire for Improvement Housing Social Support  ADL's:  Intact  Cognition: WNL  Sleep:  Fair   Screenings:   Assessment and Plan: Roann Merk is a 39 year old Caucasian female lives in [place], has a history of bipolar disorder PTSD, social anxiety was evaluated by telemedicine today.  Patient is biologically predisposed given her history of trauma, family history of mental health problems.  Patient with psychosocial stressors of current pandemic, job related stressors, mother's health issues.  Patient is currently struggling with anxiety symptoms and will benefit from the following medication changes.  Plan Bipolar disorder in full remission Lithium 300 mg p.o. daily with breakfast Lithium level on 11/30/2019-0.2-subtherapeutic Continue Depakote 500 mg p.o. daily Depakote level-06/16/2019-30-subtherapeutic Depakote dosage was reduced due to elevated liver function in the past.   PTSD-stable We will monitor closely  Social anxiety disorder-stable Hydroxyzine as needed.  GAD-unstable Increase Paxil to 40 mg p.o. daily Advised patient to make use of hydroxyzine 1 to 2 capsules as needed when she has severe anxiety symptoms Pamela Rasmussen 1 capsule as needed. Will refer for CBT.  Patient advised to continue to follow-up with her primary care provider for her lower extremity pain likely from muscle strain.  Follow-up in clinic in 4 weeks or sooner if needed.  I have spent atleast 20 minutes face to face by video with patient today. More than 50 % of the time was spent for preparing to see the patient ( e.g., review of test, records ), ordering medications and test ,psychoeducation and supportive psychotherapy and care coordination,as well as documenting clinical information in electronic health record. This note was generated in part or whole with voice recognition  software. Voice recognition is usually quite accurate but there are transcription errors that can and very often do occur. I apologize for any typographical errors that were not detected and corrected.        Jomarie Longs, MD 01/04/2020, 9:19 AM

## 2020-01-16 ENCOUNTER — Emergency Department
Admission: EM | Admit: 2020-01-16 | Discharge: 2020-01-16 | Disposition: A | Discharge status: Home / Self Care | Payer: BLUE CROSS/BLUE SHIELD | Attending: Emergency Medicine | Admitting: Emergency Medicine

## 2020-01-16 ENCOUNTER — Ambulatory Visit: Payer: BLUE CROSS/BLUE SHIELD | Admitting: Licensed Clinical Social Worker

## 2020-01-16 ENCOUNTER — Encounter: Payer: Self-pay | Admitting: Emergency Medicine

## 2020-01-16 ENCOUNTER — Other Ambulatory Visit: Payer: Self-pay

## 2020-01-16 DIAGNOSIS — M5442 Lumbago with sciatica, left side: Secondary | ICD-10-CM | POA: Insufficient documentation

## 2020-01-16 DIAGNOSIS — Z79899 Other long term (current) drug therapy: Secondary | ICD-10-CM | POA: Diagnosis not present

## 2020-01-16 DIAGNOSIS — M5432 Sciatica, left side: Secondary | ICD-10-CM

## 2020-01-16 DIAGNOSIS — M79605 Pain in left leg: Secondary | ICD-10-CM | POA: Diagnosis present

## 2020-01-16 MED ORDER — METHOCARBAMOL 500 MG PO TABS: 500.0000 mg | ORAL_TABLET | Freq: Every day | ORAL | 0 refills | Status: DC

## 2020-01-16 MED ORDER — MELOXICAM 15 MG PO TABS: 15.0000 mg | ORAL_TABLET | Freq: Every day | ORAL | 0 refills | Status: DC

## 2020-01-16 MED ORDER — KETOROLAC TROMETHAMINE 30 MG/ML IJ SOLN
30.0000 mg | Freq: Once | INTRAMUSCULAR | Status: AC
  Administered 2020-01-16: 17:00:00 30 mg via INTRAMUSCULAR
  Filled 2020-01-16: qty 1

## 2020-01-16 NOTE — ED Triage Notes (Signed)
Pt via pov from home with left leg and buttock pain. Pt states it stays in that area. Pt has not noticed any redness or swelling. She reports that sometimes her leg "goes to sleep" when she is walking and that when she tries to straighten her leg it "feels like it is pulling." pt alert & oriented, NAD Noted.

## 2020-01-16 NOTE — ED Provider Notes (Signed)
[facility] Emergency Department Provider Note  ____________________________________________  Time seen: Approximately 3:32 PM  I have reviewed the triage vital signs and the nursing notes.   HISTORY  Chief Complaint Leg Pain    HPI Pamela Rasmussen is a 39 y.o. female who presents the emergency department complaining of pain, numbness, tingling in the left buttocks that occasionally radiates into the leg.  Patient states that it is a burning sensation.  No recent trauma.  No history of chronic back issues.  She denies any back pain itself.  No bowel or bladder dysfunction, saddle esthesia or paresthesias.  Patient states that the pain, numbness and tingling involves only the buttocks radiating occasionally into the upper leg.  Patient is amatory without difficulties.  Patient states that she is taken Tylenol sporadically  for pain without relief.  This is been ongoing for approximately a month.        Past Medical History:  Diagnosis Date   Anxiety    Bipolar 1 disorder (HCC)    Vertigo     Patient Active Problem List   Diagnosis Date Noted   GAD (generalized anxiety disorder) 11/03/2019   Bipolar disorder, in full remission, most recent episode mixed (HCC) 01/25/2019   Bipolar disorder, in partial remission, most recent episode mixed (HCC) 01/06/2019   Morbid (severe) obesity due to excess calories (HCC) 12/23/2018   High risk medication use 10/26/2018   Bipolar 1 disorder, mixed, moderate (HCC) 10/05/2018   Chronic post-traumatic stress disorder (PTSD) 10/05/2018   Social anxiety disorder 10/05/2018   Bipolar depression (HCC) 05/05/2017   BMI 45.0-49.9, adult (HCC) 05/05/2017    Past Surgical History:  Procedure Laterality Date   CHOLECYSTECTOMY      Prior to Admission medications   Medication Sig Start Date End Date Taking? Authorizing Provider  diclofenac (VOLTAREN) 75 MG EC tablet  07/12/18   [provider]   divalproex (DEPAKOTE ER) 500 MG 24 hr tablet Take 1 tablet (500 mg total) by mouth at bedtime. 12/05/19   Jomarie Longs, MD  hydrOXYzine (VISTARIL) 25 MG capsule Take 1 capsule (25 mg total) by mouth 3 (three) times daily as needed. 01/04/20   Jomarie Longs, MD  lithium 300 MG tablet Take 1 tablet (300 mg total) by mouth daily with breakfast. 01/04/20   Jomarie Longs, MD  medroxyPROGESTERone (DEPO-PROVERA) 150 MG/ML injection every 3 (three) months    [provider]  meloxicam (MOBIC) 15 MG tablet Take 1 tablet (15 mg total) by mouth daily. 01/16/20   Mitcheal Sweetin, Delorise Royals, PA-C  methocarbamol (ROBAXIN) 500 MG tablet Take 1 tablet (500 mg total) by mouth at bedtime. 01/16/20   Jarmal Lewelling, Delorise Royals, PA-C  NEOMYCIN-POLYMYXIN-HYDROCORTISONE (CORTISPORIN) 1 % SOLN OTIC solution INSTILL 3 DROPS INTO RIGHT EAR 4 TIMES DAILY FOR 10 DAYS 08/16/19   [provider]  olanzapine zydis (ZYPREXA) 15 MG disintegrating tablet DISSOLVE 1 TABLET IN MOUTH AT BEDTIME 11/03/19   Jomarie Longs, MD  PARoxetine (PAXIL) 40 MG tablet Take 1 tablet (40 mg total) by mouth every morning. 01/04/20   Jomarie Longs, MD    Allergies Levaquin [levofloxacin in d5w] and Penicillins  Family History  Problem Relation Age of Onset   Bipolar disorder Mother    Down syndrome Brother    Schizophrenia Maternal Aunt    Bipolar disorder Paternal Grandmother    Schizophrenia Paternal Grandmother     Social History Social History   Tobacco Use   Smoking status: Never Smoker  Smokeless tobacco: Never Used  Vaping Use   Vaping Use: Never used  Substance Use Topics   Alcohol use: No   Drug use: No     Review of Systems  Constitutional: No fever/chills Eyes: No visual changes. No discharge ENT: No upper respiratory complaints. Cardiovascular: no chest pain. Respiratory: no cough. No SOB. Gastrointestinal: No abdominal pain.  No nausea, no vomiting.  No diarrhea.  No  constipation. Genitourinary: Negative for dysuria. No hematuria Musculoskeletal: Positive for pain in the left buttocks described as a burning sensation Skin: Negative for rash, abrasions, lacerations, ecchymosis. Neurological: Negative for headaches, focal weakness or numbness.  10 System ROS otherwise negative.  ____________________________________________   PHYSICAL EXAM:  VITAL SIGNS: ED Triage Vitals  Enc Vitals Group     BP 01/16/20 1444 135/85     Pulse Rate 01/16/20 1444 96     Resp 01/16/20 1444 18     Temp 01/16/20 1444 98 F (36.7 C)     Temp Source 01/16/20 1444 Oral     SpO2 01/16/20 1444 97 %     Weight 01/16/20 1451 278 lb (126.1 kg)     Height 01/16/20 1451 5\' 4"  (1.626 m)     Head Circumference --      Peak Flow --      Pain Score 01/16/20 1451 8     Pain Loc --      Pain Edu? --      Excl. in GC? --      Constitutional: Alert and oriented. Well appearing and in no acute distress. Eyes: Conjunctivae are normal. PERRL. EOMI. Head: Atraumatic. ENT:      Ears:       Nose: No congestion/rhinnorhea.      Mouth/Throat: Mucous membranes are moist.  Neck: No stridor.    Cardiovascular: Normal rate, regular rhythm. Normal S1 and S2.  Good peripheral circulation. Respiratory: Normal respiratory effort without tachypnea or retractions. Lungs CTAB. Good air entry to the bases with no decreased or absent breath sounds. Gastrointestinal: Bowel sounds 4 quadrants. Soft and nontender to palpation. No guarding or rigidity. No palpable masses. No distention. No CVA tenderness. Musculoskeletal: Full range of motion to all extremities. No gross deformities appreciated.  Visualization of the back, hip, leg is unremarkable.  No edema, erythema, ecchymosis to any region.  Full range of motion to the lumbar spine with flexion, extension, rotation.  Full range of motion to the hip, knee and ankle joints.  Nontender to palpation throughout the lumbar spine, left hip, left femur,  left knee and lower leg.  Dorsalis pedis pulses sensation intact distally. Neurologic:  Normal speech and language. No gross focal neurologic deficits are appreciated.  Skin:  Skin is warm, dry and intact. No rash noted. Psychiatric: Mood and affect are normal. Speech and behavior are normal. Patient exhibits appropriate insight and judgement.   ____________________________________________   LABS (all labs ordered are listed, but only abnormal results are displayed)  Labs Reviewed - No data to display ____________________________________________  EKG   ____________________________________________  RADIOLOGY   No results found.  ____________________________________________    PROCEDURES  Procedure(s) performed:    Procedures    Medications  ketorolac (TORADOL) 30 MG/ML injection 30 mg (has no administration in time range)     ____________________________________________   INITIAL IMPRESSION / ASSESSMENT AND PLAN / ED COURSE  Pertinent labs & imaging results that were available during my care of the patient were reviewed by me and considered in my medical  decision making (see chart for details).  Review of the [place] CSRS was performed in accordance of the [facility] prior to dispensing any controlled drugs.           Patient's diagnosis is consistent with sciatica.  Patient presents emergency department with intermittent pain, described as a burning sensation and numbness and tingling in the left leg.  Patient states this is only going x1 month.  No reported trauma.  No urinary, GI complaints.  No saddle anesthesia or paresthesias.  Exam was reassuring.  Patient symptom description is most consistent with sciatica.  She will be started on anti-inflammatory muscle relaxer for same.  Patient is driving so she receives a Toradol injection but no muscle relaxer here in the emergency department.  Concerning signs and symptoms are discussed with the patient.  Differential  included lumbar strain, sciatica, bursitis, arthritis.  Follow-up with primary care as needed.  Patient is given ED precautions to return to the ED for any worsening or new symptoms.     ____________________________________________  FINAL CLINICAL IMPRESSION(S) / ED DIAGNOSES  Final diagnoses:  Sciatica of left side      NEW MEDICATIONS STARTED DURING THIS VISIT:  ED Discharge Orders         Ordered    meloxicam (MOBIC) 15 MG tablet  Daily        01/16/20 1621    methocarbamol (ROBAXIN) 500 MG tablet  Daily at bedtime        01/16/20 1621              This chart was dictated using voice recognition software/Dragon. Despite best efforts to proofread, errors can occur which can change the meaning. Any change was purely unintentional.    Racheal Patches, PA-C 01/16/20 1622    Phineas Semen, MD 01/16/20 2135

## 2020-01-16 NOTE — ED Notes (Signed)
Pt reports pain to back of left thigh and buttocks, radiates to lower thigh when ambulating. Pain began approx 3 weeks ago, worsening since then. Has taken tylenol without relief.   Pt reports tingling in foot, throbbing pain in buttock and thigh. Pt reports tightness in leg that limits her ability to straighten her leg fully.

## 2020-01-16 NOTE — ED Notes (Signed)
Pt ambulates to room easily, no problems noted. Even steady gait

## 2020-01-16 NOTE — ED Notes (Signed)
Pt reports some relief with ice/heat. Pt states [facility] and they said she might need physical therapy for her leg. Pt reports no history of injury to lumber or that leg. Denies being on steroids before

## 2020-02-10 ENCOUNTER — Telehealth: Payer: BLUE CROSS/BLUE SHIELD | Admitting: Psychiatry

## 2020-03-05 ENCOUNTER — Telehealth: Payer: BLUE CROSS/BLUE SHIELD | Admitting: Psychiatry

## 2020-03-18 ENCOUNTER — Other Ambulatory Visit: Payer: Self-pay | Admitting: Psychiatry

## 2020-03-18 DIAGNOSIS — F411 Generalized anxiety disorder: Secondary | ICD-10-CM

## 2020-03-18 DIAGNOSIS — F4312 Post-traumatic stress disorder, chronic: Secondary | ICD-10-CM

## 2020-03-18 DIAGNOSIS — F4010 Social phobia, unspecified: Secondary | ICD-10-CM

## 2020-03-21 ENCOUNTER — Telehealth: Payer: Self-pay

## 2020-03-21 ENCOUNTER — Telehealth (INDEPENDENT_AMBULATORY_CARE_PROVIDER_SITE_OTHER): Payer: BLUE CROSS/BLUE SHIELD | Admitting: Psychiatry

## 2020-03-21 ENCOUNTER — Encounter: Payer: Self-pay | Admitting: Psychiatry

## 2020-03-21 ENCOUNTER — Other Ambulatory Visit: Payer: Self-pay

## 2020-03-21 DIAGNOSIS — F3178 Bipolar disorder, in full remission, most recent episode mixed: Secondary | ICD-10-CM | POA: Diagnosis not present

## 2020-03-21 DIAGNOSIS — F4010 Social phobia, unspecified: Secondary | ICD-10-CM | POA: Diagnosis not present

## 2020-03-21 DIAGNOSIS — F4312 Post-traumatic stress disorder, chronic: Secondary | ICD-10-CM

## 2020-03-21 DIAGNOSIS — Z79899 Other long term (current) drug therapy: Secondary | ICD-10-CM

## 2020-03-21 DIAGNOSIS — F411 Generalized anxiety disorder: Secondary | ICD-10-CM

## 2020-03-21 MED ORDER — DIVALPROEX SODIUM ER 500 MG PO TB24: 500.0000 mg | ORAL_TABLET | Freq: Every day | ORAL | 1 refills | Status: DC

## 2020-03-21 MED ORDER — OLANZAPINE 15 MG PO TBDP: ORAL_TABLET | ORAL | 2 refills | Status: DC

## 2020-03-21 MED ORDER — PAROXETINE HCL 40 MG PO TABS
ORAL_TABLET | ORAL | 0 refills | Status: DC
Start: 2020-03-21 — End: 2020-07-09

## 2020-03-21 NOTE — Progress Notes (Signed)
Virtual Visit via Video Note  I connected with Pamela Rasmussen on 03/21/20 at 10:30 AM EST by a video enabled telemedicine application and verified that I am speaking with the correct person using two identifiers.  Location Provider Location : [facility] Patient Location : Home  Participants: Patient , Provider   I discussed the limitations of evaluation and management by telemedicine and the availability of in person appointments. The patient expressed understanding and agreed to proceed.   I discussed the assessment and treatment plan with the patient. The patient was provided an opportunity to ask questions and all were answered. The patient agreed with the plan and demonstrated an understanding of the instructions.   The patient was advised to call back or seek an in-person evaluation if the symptoms worsen or if the condition fails to improve as anticipated.   [facility] MD OP Progress Note  03/21/2020 11:00 AM Pamela Rasmussen  MRN:  [id number]  Chief Complaint:  Chief Complaint    Follow-up     HPI: Pamela Rasmussen is a 40 year old Caucasian female, single, lives in [place], has a history of bipolar disorder, PTSD, social anxiety disorder was evaluated by telemedicine today.  Patient today reports she is currently doing well.  She reports she enjoys her work.  She reports she was able to buy a new car.  She reports mood wise she is doing okay.  Denies any mood swings.  Denies any sadness.  Patient denies any suicidality, homicidality or perceptual disturbances.  Patient reports she is compliant on her medications.  Denies side effects.  She reports sleep and appetite is fair.  Patient denies any suicidality, homicidality or perceptual disturbances.  Reviewed and discussed most recent hemoglobin A1c, discussed with patient that it is elevated.  She reports she is currently following up with her primary care provider, currently not on any medications.  She agrees to  get in touch with her PMD for the same.  Patient will benefit from labs, this was discussed with patient and she is agreeable.  We will order the same.  Patient denies any other concerns today.  Visit Diagnosis:    ICD-10-CM   1. Bipolar disorder, in full remission, most recent episode mixed (HCC)  F31.78   2. Chronic post-traumatic stress disorder (PTSD)  F43.12 divalproex (DEPAKOTE ER) 500 MG 24 hr tablet    olanzapine zydis (ZYPREXA) 15 MG disintegrating tablet    PARoxetine (PAXIL) 40 MG tablet  3. Social anxiety disorder  F40.10 divalproex (DEPAKOTE ER) 500 MG 24 hr tablet    PARoxetine (PAXIL) 40 MG tablet  4. GAD (generalized anxiety disorder)  F41.1 PARoxetine (PAXIL) 40 MG tablet  5. High risk medication use  Z79.899 Lipid panel    Prolactin    BUN+Creat    Lithium level    TSH    Past Psychiatric History: I have reviewed past psychiatric history from my progress note on 01/04/2018.  Past trials of Celexa, Lexapro, olanzapine, Wellbutrin, Depakote, risperidone  Past Medical History:  Past Medical History:  Diagnosis Date  . Anxiety   . Bipolar 1 disorder (HCC)   . Vertigo     Past Surgical History:  Procedure Laterality Date  . CHOLECYSTECTOMY      Family Psychiatric History: I have reviewed family psychiatric history from my progress note on 01/04/2018  Family History:  Family History  Problem Relation Age of Onset  . Bipolar disorder Mother   . Down syndrome Brother   . Schizophrenia Maternal Aunt   .  Bipolar disorder Paternal Grandmother   . Schizophrenia Paternal Grandmother     Social History: Reviewed social history from my progress note on 01/04/2018 Social History   Socioeconomic History  . Marital status: Significant Other    Spouse name: Not on file  . Number of children: 9  . Years of education: Not on file  . Highest education level: Associate degree: occupational, Scientist, product/process developmenttechnical, or vocational program  Occupational History    Comment: not  employed  Tobacco Use  . Smoking status: Never Smoker  . Smokeless tobacco: Never Used  Vaping Use  . Vaping Use: Never used  Substance and Sexual Activity  . Alcohol use: No  . Drug use: No  . Sexual activity: Yes  Other Topics Concern  . Not on file  Social History Narrative  . Not on file   Social Determinants of Health   Financial Resource Strain: Not on file  Food Insecurity: Not on file  Transportation Needs: Not on file  Physical Activity: Not on file  Stress: Not on file  Social Connections: Not on file    Allergies:  Allergies  Allergen Reactions  . Levaquin [Levofloxacin In D5w] Itching  . Penicillins Rash    Has patient had a PCN reaction causing immediate rash, facial/tongue/throat swelling, SOB or lightheadedness with hypotension: No Has patient had a PCN reaction causing severe rash involving mucus membranes or skin necrosis: No Has patient had a PCN reaction that required hospitalization: No Has patient had a PCN reaction occurring within the last 10 years: No If all of the above answers are "NO", then may proceed with Cephalosporin use.     Metabolic Disorder Labs: Lab Results  Component Value Date   HGBA1C 8.2 (H) 06/16/2019   Lab Results  Component Value Date   PROLACTIN 6.9 01/11/2018   No results found for: CHOL, TRIG, HDL, CHOLHDL, VLDL, LDLCALC Lab Results  Component Value Date   TSH 2.530 11/05/2018    Therapeutic Level Labs: Lab Results  Component Value Date   LITHIUM 0.2 (L) 11/30/2019   LITHIUM 0.3 (L) 06/16/2019   Lab Results  Component Value Date   VALPROATE 30 (L) 06/16/2019   VALPROATE 67 11/05/2018   No components found for:  CBMZ  Current Medications: Current Outpatient Medications  Medication Sig Dispense Refill  . clindamycin (CLEOCIN) 300 MG capsule clindamycin HCl 300 mg capsule    . diclofenac (VOLTAREN) 75 MG EC tablet     . divalproex (DEPAKOTE ER) 500 MG 24 hr tablet Take 1 tablet (500 mg total) by mouth at  bedtime. 90 tablet 1  . hydrOXYzine (VISTARIL) 25 MG capsule Take 1 capsule (25 mg total) by mouth 3 (three) times daily as needed. 90 capsule 1  . ketoconazole (NIZORAL) 2 % cream Apply topically.    Marland Kitchen. ketoconazole (NIZORAL) 2 % cream ketoconazole 2 % topical cream  APPLY TOPICALLY TWICE DAILY FOR 10 DAYS. REPEAT FOR ADDITIONAL 10 DAYS AS NEEDED    . lithium 300 MG tablet Take 1 tablet (300 mg total) by mouth daily with breakfast. 90 tablet 1  . medroxyPROGESTERone (DEPO-PROVERA) 150 MG/ML injection every 3 (three) months    . meloxicam (MOBIC) 15 MG tablet Take 1 tablet (15 mg total) by mouth daily. 30 tablet 0  . methocarbamol (ROBAXIN) 500 MG tablet Take 1 tablet (500 mg total) by mouth at bedtime. 16 tablet 0  . mupirocin ointment (BACTROBAN) 2 % mupirocin 2 % topical ointment    . NEOMYCIN-POLYMYXIN-HYDROCORTISONE (CORTISPORIN) 1 %  SOLN OTIC solution INSTILL 3 DROPS INTO RIGHT EAR 4 TIMES DAILY FOR 10 DAYS    . olanzapine zydis (ZYPREXA) 15 MG disintegrating tablet DISSOLVE 1 TABLET IN MOUTH AT BEDTIME 30 tablet 2  . PARoxetine (PAXIL) 40 MG tablet TAKE 1 TABLET BY MOUTH ONCE DAILY IN THE MORNING 90 tablet 0   No current facility-administered medications for this visit.     Musculoskeletal: Strength & Muscle Tone: UTA Gait & Station: UTA Patient leans: N/A  Psychiatric Specialty Exam: Review of Systems  Psychiatric/Behavioral: Negative for agitation, behavioral problems, confusion, decreased concentration, dysphoric mood, hallucinations, self-injury, sleep disturbance and suicidal ideas. The patient is not nervous/anxious and is not hyperactive.   All other systems reviewed and are negative.   There were no vitals taken for this visit.There is no height or weight on file to calculate BMI.  General Appearance: Casual  Eye Contact:  Fair  Speech:  Clear and Coherent  Volume:  Normal  Mood:  Euthymic  Affect:  Congruent  Thought Process:  Goal Directed and Descriptions of  Associations: Intact  Orientation:  Full (Time, Place, and Person)  Thought Content: Logical   Suicidal Thoughts:  No  Homicidal Thoughts:  No  Memory:  Immediate;   Fair Recent;   Fair Remote;   Fair  Judgement:  Fair  Insight:  Fair  Psychomotor Activity:  Normal  Concentration:  Concentration: Fair and Attention Span: Fair  Recall:  [employer] of Knowledge: Fair  Language: Fair  Akathisia:  No  Handed:  Right  AIMS (if indicated): UTA  Assets:  Communication Skills Desire for Improvement Housing Social Support  ADL's:  Intact  Cognition: WNL  Sleep:  Fair   Screenings: PHQ2-9   Flowsheet Row Video Visit from 03/21/2020 in [facility]  PHQ-2 Total Score 0    Flowsheet Row Video Visit from 03/21/2020 in [facility] ED from 01/23/2018 in [facility]  C-SSRS RISK CATEGORY No Risk Moderate Risk       Assessment and Plan: Pamela Rasmussen is a 40 year old Caucasian female, lives in [place], has a history of bipolar disorder, PTSD, social anxiety disorder was evaluated by telemedicine today.  Patient is biologically predisposed given her history of trauma, family history of mental health problems.  Patient with psychosocial stressors of current pandemic, job related stressors, mother's health issues.  Patient is currently stable on current medication regimen.  Plan as noted below.  Plan Bipolar disorder in full remission Lithium 300 mg p.o. daily with breakfast Lithium level-11/30/2019-0.2-subtherapeutic Depakote 500 mg p.o. daily Depakote level 06/16/18 21-30-subtherapeutic Depakote dosage was reduced due to elevated liver function in the past. Continue Zyprexa 15 mg p.o. nightly.  Will consider tapering of Zyprexa gradually.  Will reevaluate in future sessions. Will not make any further medication changes at this time since she is currently stable.  PTSD-stable We  will monitor closely Continue Paxil as prescribed  Social anxiety disorder-stable Continue hydroxyzine 25 mg p.o. 3 times daily as needed Continue Paxil as prescribed  GAD-stable Paxil 40 mg p.o. daily Patient was referred for CBT in the past.  High risk medication use-patient will benefit from lithium level, BUN/creatinine, TSH, prolactin, lipid panel-she will go to [facility] lab.  Faxed over. I have reviewed and discussed most recent hemoglobin A1c-dated 03/05/2020--elevated Hemoglobin A1C 4.8 - 5.6 % 9.2High   Patient advised to follow-up with her primary care provider for management.  Follow-up in clinic in 1 month  or sooner if needed.  I have spent atleast 30 minutes face to face by video with patient today. More than 50 % of the time was spent for preparing to see the patient ( e.g., review of test, records ), obtaining and to review and separately obtained history , ordering medications and test ,psychoeducation and supportive psychotherapy and care coordination,as well as documenting clinical information in electronic health record,interpreting and communication of test results. This note was generated in part or whole with voice recognition software. Voice recognition is usually quite accurate but there are transcription errors that can and very often do occur. I apologize for any typographical errors that were not detected and corrected.       Jomarie Longs, MD 03/21/2020, 11:00 AM

## 2020-03-21 NOTE — Telephone Encounter (Signed)
faxed and confirmed labwork orders faxed to [facility] [phone].  lipid panel, prolactin, bun +Creat, tsh, lithium levels ordered dx z79.899

## 2020-03-23 ENCOUNTER — Other Ambulatory Visit
Admission: RE | Admit: 2020-03-23 | Discharge: 2020-03-23 | Disposition: A | Discharge status: Home / Self Care | Payer: BLUE CROSS/BLUE SHIELD | Attending: Psychiatry | Admitting: Psychiatry

## 2020-03-23 DIAGNOSIS — F3178 Bipolar disorder, in full remission, most recent episode mixed: Secondary | ICD-10-CM | POA: Diagnosis not present

## 2020-03-23 DIAGNOSIS — Z5181 Encounter for therapeutic drug level monitoring: Secondary | ICD-10-CM | POA: Diagnosis not present

## 2020-03-23 DIAGNOSIS — Z79899 Other long term (current) drug therapy: Secondary | ICD-10-CM | POA: Diagnosis not present

## 2020-03-23 DIAGNOSIS — F411 Generalized anxiety disorder: Secondary | ICD-10-CM | POA: Insufficient documentation

## 2020-03-23 DIAGNOSIS — F4312 Post-traumatic stress disorder, chronic: Secondary | ICD-10-CM | POA: Insufficient documentation

## 2020-03-23 DIAGNOSIS — F4010 Social phobia, unspecified: Secondary | ICD-10-CM | POA: Diagnosis not present

## 2020-03-23 LAB — LIPID PANEL
Cholesterol: 256 mg/dL — ABNORMAL HIGH (ref 0–200)
HDL: 46 mg/dL (ref >40)
LDL Cholesterol: 138 mg/dL — ABNORMAL HIGH (ref 0–99)
Total CHOL/HDL Ratio: 5.6 RATIO
Triglycerides: 360 mg/dL — ABNORMAL HIGH (ref <150)
VLDL: 72 mg/dL — ABNORMAL HIGH (ref 0–40)

## 2020-03-23 LAB — TSH: TSH: 1.039 u[IU]/mL (ref 0.350–4.500)

## 2020-03-23 LAB — BUN: BUN: 10 mg/dL (ref 6–20)

## 2020-03-23 LAB — LITHIUM LEVEL: Lithium Lvl: <0.06 mmol/L — ABNORMAL LOW (ref 0.60–1.20)

## 2020-03-24 LAB — PROLACTIN: Prolactin: 18.1 ng/mL (ref 4.8–23.3)

## 2020-04-18 ENCOUNTER — Other Ambulatory Visit: Payer: Self-pay

## 2020-04-18 ENCOUNTER — Encounter: Payer: Self-pay | Admitting: Psychiatry

## 2020-04-18 ENCOUNTER — Telehealth (INDEPENDENT_AMBULATORY_CARE_PROVIDER_SITE_OTHER): Payer: BLUE CROSS/BLUE SHIELD | Admitting: Psychiatry

## 2020-04-18 DIAGNOSIS — F4312 Post-traumatic stress disorder, chronic: Secondary | ICD-10-CM

## 2020-04-18 DIAGNOSIS — F3178 Bipolar disorder, in full remission, most recent episode mixed: Secondary | ICD-10-CM

## 2020-04-18 DIAGNOSIS — F4010 Social phobia, unspecified: Secondary | ICD-10-CM

## 2020-04-18 DIAGNOSIS — Z79899 Other long term (current) drug therapy: Secondary | ICD-10-CM

## 2020-04-18 DIAGNOSIS — F411 Generalized anxiety disorder: Secondary | ICD-10-CM

## 2020-04-18 MED ORDER — OLANZAPINE 10 MG PO TBDP: 10.0000 mg | ORAL_TABLET | Freq: Every day | ORAL | 0 refills | Status: DC

## 2020-04-18 NOTE — Progress Notes (Signed)
Virtual Visit via Video Note  I connected with Pamela Rasmussen on 04/18/20 at  9:40 AM EDT by a video enabled telemedicine application and verified that I am speaking with the correct person using two identifiers.  Location Provider Location : [facility] Patient Location : Home  Participants: Patient , Provider   I discussed the limitations of evaluation and management by telemedicine and the availability of in person appointments. The patient expressed understanding and agreed to proceed.   I discussed the assessment and treatment plan with the patient. The patient was provided an opportunity to ask questions and all were answered. The patient agreed with the plan and demonstrated an understanding of the instructions.   The patient was advised to call back or seek an in-person evaluation if the symptoms worsen or if the condition fails to improve as anticipated.   BH MD OP Progress Note  04/18/2020 10:00 AM Pamela Rasmussen  MRN:  [id number]  Chief Complaint:  Chief Complaint    Follow-up; Hallucinations     HPI: Pamela Rasmussen is a 40 year old Caucasian female, single, lives in [place], has a history of bipolar disorder, PTSD, social anxiety disorder was evaluated by telemedicine today.  Patient today reports she is currently doing well with regards to her mood.  Denies any significant depression or anxiety.  She reports work is going well.  She was able to buy a car.  She reports sleep is good.  She denies suicidality, homicidality or perceptual disturbances.  Patient reports she was recently diagnosed with diabetes and is currently taking metformin.  She reports she is also watching her diet.  Patient denies any other concerns today.  Visit Diagnosis:    ICD-10-CM   1. Bipolar disorder, in full remission, most recent episode mixed (HCC)  F31.78 OLANZapine zydis (ZYPREXA) 10 MG disintegrating tablet  2. Chronic post-traumatic stress disorder (PTSD)  F43.12    3. Social anxiety disorder  F40.10   4. GAD (generalized anxiety disorder)  F41.1   5. High risk medication use  Z79.899     Past Psychiatric History: I have reviewed past psychiatric history from my progress note on 01/04/2018.  Past trials of Celexa, Lexapro, olanzapine, Wellbutrin, Depakote, risperidone  Past Medical History:  Past Medical History:  Diagnosis Date  . Anxiety   . Bipolar 1 disorder (HCC)   . Vertigo     Past Surgical History:  Procedure Laterality Date  . CHOLECYSTECTOMY      Family Psychiatric History: I have reviewed family psychiatric history from my progress note on 01/04/2018  Family History:  Family History  Problem Relation Age of Onset  . Bipolar disorder Mother   . Down syndrome Brother   . Schizophrenia Maternal Aunt   . Bipolar disorder Paternal Grandmother   . Schizophrenia Paternal Grandmother     Social History: Reviewed social history from my progress note on 01/04/2018 Social History   Socioeconomic History  . Marital status: Significant Other    Spouse name: Not on file  . Number of children: 9  . Years of education: Not on file  . Highest education level: Associate degree: occupational, Scientist, product/process development, or vocational program  Occupational History    Comment: not employed  Tobacco Use  . Smoking status: Never Smoker  . Smokeless tobacco: Never Used  Vaping Use  . Vaping Use: Never used  Substance and Sexual Activity  . Alcohol use: No  . Drug use: No  . Sexual activity: Yes  Other Topics Concern  .  Not on file  Social History Narrative  . Not on file   Social Determinants of Health   Financial Resource Strain: Not on file  Food Insecurity: Not on file  Transportation Needs: Not on file  Physical Activity: Not on file  Stress: Not on file  Social Connections: Not on file    Allergies:  Allergies  Allergen Reactions  . Levaquin [Levofloxacin In D5w] Itching  . Penicillins Rash    Has patient had a PCN reaction causing  immediate rash, facial/tongue/throat swelling, SOB or lightheadedness with hypotension: No Has patient had a PCN reaction causing severe rash involving mucus membranes or skin necrosis: No Has patient had a PCN reaction that required hospitalization: No Has patient had a PCN reaction occurring within the last 10 years: No If all of the above answers are "NO", then may proceed with Cephalosporin use.     Metabolic Disorder Labs: Lab Results  Component Value Date   HGBA1C 8.2 (H) 06/16/2019   Lab Results  Component Value Date   PROLACTIN 18.1 03/23/2020   PROLACTIN 6.9 01/11/2018   Lab Results  Component Value Date   CHOL 256 (H) 03/23/2020   TRIG 360 (H) 03/23/2020   HDL 46 03/23/2020   CHOLHDL 5.6 03/23/2020   VLDL 72 (H) 03/23/2020   LDLCALC 138 (H) 03/23/2020   Lab Results  Component Value Date   TSH 1.039 03/23/2020   TSH 2.530 11/05/2018    Therapeutic Level Labs: Lab Results  Component Value Date   LITHIUM <0.06 (L) 03/23/2020   LITHIUM 0.2 (L) 11/30/2019   Lab Results  Component Value Date   VALPROATE 30 (L) 06/16/2019   VALPROATE 67 11/05/2018   No components found for:  CBMZ  Current Medications: Current Outpatient Medications  Medication Sig Dispense Refill  . metFORMIN (GLUCOPHAGE) 500 MG tablet Take by mouth.    . OLANZapine zydis (ZYPREXA) 10 MG disintegrating tablet Take 1 tablet (10 mg total) by mouth at bedtime. 30 tablet 0  . clindamycin (CLEOCIN) 300 MG capsule clindamycin HCl 300 mg capsule    . diclofenac (VOLTAREN) 75 MG EC tablet     . divalproex (DEPAKOTE ER) 500 MG 24 hr tablet Take 1 tablet (500 mg total) by mouth at bedtime. 90 tablet 1  . hydrOXYzine (VISTARIL) 25 MG capsule Take 1 capsule (25 mg total) by mouth 3 (three) times daily as needed. 90 capsule 1  . ketoconazole (NIZORAL) 2 % cream Apply topically.    Marland Kitchen. ketoconazole (NIZORAL) 2 % cream ketoconazole 2 % topical cream  APPLY TOPICALLY TWICE DAILY FOR 10 DAYS. REPEAT FOR  ADDITIONAL 10 DAYS AS NEEDED    . lithium 300 MG tablet Take 1 tablet (300 mg total) by mouth daily with breakfast. 90 tablet 1  . medroxyPROGESTERone (DEPO-PROVERA) 150 MG/ML injection every 3 (three) months    . meloxicam (MOBIC) 15 MG tablet Take 1 tablet (15 mg total) by mouth daily. 30 tablet 0  . methocarbamol (ROBAXIN) 500 MG tablet Take 1 tablet (500 mg total) by mouth at bedtime. 16 tablet 0  . mupirocin ointment (BACTROBAN) 2 % mupirocin 2 % topical ointment    . NEOMYCIN-POLYMYXIN-HYDROCORTISONE (CORTISPORIN) 1 % SOLN OTIC solution INSTILL 3 DROPS INTO RIGHT EAR 4 TIMES DAILY FOR 10 DAYS    . PARoxetine (PAXIL) 40 MG tablet TAKE 1 TABLET BY MOUTH ONCE DAILY IN THE MORNING 90 tablet 0   No current facility-administered medications for this visit.     Musculoskeletal: Strength &  Muscle Tone: UTA Gait & Station: UTA Patient leans: N/A  Psychiatric Specialty Exam: Review of Systems  Genitourinary: Positive for frequency.  Psychiatric/Behavioral: Negative for agitation, behavioral problems, confusion, decreased concentration, dysphoric mood, hallucinations, self-injury, sleep disturbance and suicidal ideas. The patient is not nervous/anxious and is not hyperactive.   All other systems reviewed and are negative.   There were no vitals taken for this visit.There is no height or weight on file to calculate BMI.  General Appearance: Casual  Eye Contact:  Fair  Speech:  Clear and Coherent  Volume:  Normal  Mood:  Euthymic  Affect:  Congruent  Thought Process:  Goal Directed and Descriptions of Associations: Intact  Orientation:  Full (Time, Place, and Person)  Thought Content: Logical   Suicidal Thoughts:  No  Homicidal Thoughts:  No  Memory:  Immediate;   Fair Recent;   Fair Remote;   Fair  Judgement:  Fair  Insight:  Fair  Psychomotor Activity:  Normal  Concentration:  Concentration: Fair and Attention Span: Fair  Recall:  [employer] of Knowledge: Fair  Language: Fair   Akathisia:  No  Handed:  Right  AIMS (if indicated): UTA  Assets:  Communication Skills Desire for Improvement Housing Social Support Talents/Skills  ADL's:  Intact  Cognition: WNL  Sleep:  Fair   Screenings: PHQ2-9   Flowsheet Row Video Visit from 04/18/2020 in [facility] Video Visit from 03/21/2020 in [facility]  PHQ-2 Total Score 1 0    Flowsheet Row Video Visit from 04/18/2020 in [facility] Video Visit from 03/21/2020 in [facility] ED from 01/23/2018 in [facility]  C-SSRS RISK CATEGORY Low Risk No Risk Moderate Risk       Assessment and Plan: Pamela Rasmussen is a 40 year old Caucasian female, lives in [place], has a history of bipolar disorder, PTSD, social anxiety disorder was evaluated by telemedicine today.  Patient is biologically predisposed given her history of trauma, family history of mental health problems.  Patient with psychosocial stressors of current pandemic, job related stressors, mother's health issues.  Patient is currently stable however has a recent diagnosis of diabetes and will benefit from the following plan.  Plan Bipolar disorder in full remission Lithium 300 mg p.o. daily with breakfast Depakote 500 mg p.o. daily Reduce Zyprexa to 10 mg p.o. nightly.  Discussed with patient to monitor her symptoms closely and let writer know if she notices any changes in her mood.  PTSD-stable We will monitor closely Continue Paxil 40 mg p.o. daily  Social anxiety disorder-stable Continue hydroxyzine 25 mg p.o. 3 times daily as needed Continue Paxil as prescribed  GAD-stable Paxil 40 mg p.o. daily Continue CBT  High risk medication use-reviewed and discussed the following labs-dated 03/23/2020-lipid panel-abnormal-she will talk to her primary care provider and is currently watching her diet,  prolactin-within normal limits, BUN-within normal limits, TSH-within normal limits, lithium level-0.06-subtherapeutic.  However will not make any changes with lithium dosage since her mood symptoms are stable.  Patient to continue to follow-up with primary care provider for her diabetes.  Her urinary frequency likely due to recent diagnosis of diabetes.  Follow-up in clinic in 3 to 4 weeks or sooner if needed.  This note was generated in part or whole with voice recognition software. Voice recognition is usually quite accurate but there are transcription errors that can and very often do occur. I apologize for any typographical errors that were  not detected and corrected.      Jomarie Longs, MD 04/19/2020, 8:20 AM

## 2020-05-10 ENCOUNTER — Telehealth: Payer: Self-pay

## 2020-05-10 NOTE — Telephone Encounter (Signed)
went online and submitted the prior auth - pending ?

## 2020-05-10 NOTE — Telephone Encounter (Signed)
received fax that a prior auth was needed for pt olanzapine 10mg 

## 2020-05-14 ENCOUNTER — Telehealth: Payer: Self-pay | Admitting: Psychiatry

## 2020-05-14 DIAGNOSIS — F3178 Bipolar disorder, in full remission, most recent episode mixed: Secondary | ICD-10-CM

## 2020-05-14 MED ORDER — OLANZAPINE 10 MG PO TABS
10.0000 mg | ORAL_TABLET | Freq: Every day | ORAL | 1 refills | Status: DC
Start: 2020-05-14 — End: 2020-09-04

## 2020-05-14 NOTE — Telephone Encounter (Signed)
Contacted patient to discuss that her health insurance will not be covering her Zyprexa Zydis.  We will change to Zyprexa regular 10 mg tablet.  Patient is okay with the same.

## 2020-05-16 ENCOUNTER — Telehealth: Payer: BLUE CROSS/BLUE SHIELD | Admitting: Psychiatry

## 2020-05-16 ENCOUNTER — Telehealth: Payer: Self-pay

## 2020-05-16 NOTE — Telephone Encounter (Signed)
received fax that olanzapine 10mg  was denied.

## 2020-05-31 ENCOUNTER — Telehealth (INDEPENDENT_AMBULATORY_CARE_PROVIDER_SITE_OTHER): Payer: BLUE CROSS/BLUE SHIELD | Admitting: Psychiatry

## 2020-05-31 ENCOUNTER — Other Ambulatory Visit: Payer: Self-pay

## 2020-05-31 DIAGNOSIS — Z5329 Procedure and treatment not carried out because of patient's decision for other reasons: Secondary | ICD-10-CM

## 2020-05-31 NOTE — Progress Notes (Signed)
No response to call or text or video invite  

## 2020-07-09 ENCOUNTER — Other Ambulatory Visit: Payer: Self-pay | Admitting: Psychiatry

## 2020-07-09 DIAGNOSIS — F411 Generalized anxiety disorder: Secondary | ICD-10-CM

## 2020-07-09 DIAGNOSIS — F4312 Post-traumatic stress disorder, chronic: Secondary | ICD-10-CM

## 2020-07-09 DIAGNOSIS — F4010 Social phobia, unspecified: Secondary | ICD-10-CM

## 2020-07-11 ENCOUNTER — Other Ambulatory Visit: Payer: Self-pay

## 2020-07-11 ENCOUNTER — Telehealth (INDEPENDENT_AMBULATORY_CARE_PROVIDER_SITE_OTHER): Payer: BLUE CROSS/BLUE SHIELD | Admitting: Psychiatry

## 2020-07-11 ENCOUNTER — Encounter: Payer: Self-pay | Admitting: Psychiatry

## 2020-07-11 DIAGNOSIS — F411 Generalized anxiety disorder: Secondary | ICD-10-CM

## 2020-07-11 DIAGNOSIS — F4010 Social phobia, unspecified: Secondary | ICD-10-CM | POA: Diagnosis not present

## 2020-07-11 DIAGNOSIS — F4312 Post-traumatic stress disorder, chronic: Secondary | ICD-10-CM | POA: Diagnosis not present

## 2020-07-11 DIAGNOSIS — F3178 Bipolar disorder, in full remission, most recent episode mixed: Secondary | ICD-10-CM | POA: Diagnosis not present

## 2020-07-11 DIAGNOSIS — Z79899 Other long term (current) drug therapy: Secondary | ICD-10-CM

## 2020-07-11 DIAGNOSIS — Z9189 Other specified personal risk factors, not elsewhere classified: Secondary | ICD-10-CM

## 2020-07-11 MED ORDER — LITHIUM CARBONATE 300 MG PO TABS: 300.0000 mg | ORAL_TABLET | Freq: Every day | ORAL | 1 refills | Status: DC

## 2020-07-11 NOTE — Progress Notes (Signed)
Virtual Visit via Video Note  I connected with Pamela Rasmussen on 07/11/20 at 10:40 AM EDT by a video enabled telemedicine application and verified that I am speaking with the correct person using two identifiers.  Location Provider Location : Office Patient Location : Home  Participants: Patient , Provider    I discussed the limitations of evaluation and management by telemedicine and the availability of in person appointments. The patient expressed understanding and agreed to proceed.   I discussed the assessment and treatment plan with the patient. The patient was provided an opportunity to ask questions and all were answered. The patient agreed with the plan and demonstrated an understanding of the instructions.   The patient was advised to call back or seek an in-person evaluation if the symptoms worsen or if the condition fails to improve as anticipated.   BH MD OP Progress Note  07/11/2020 12:59 PM Adriannah Steinkamp  MRN:  [id number]  Chief Complaint:  Chief Complaint    Follow-up; Depression     HPI: Pamela Rasmussen is a 40 year old Caucasian female, single, lives in [place], has a history of bipolar disorder, PTSD, social anxiety disorder was evaluated by telemedicine today.  Patient today reports she is currently employed with a new company.  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  So far she is doing well and likes her job.  She reports overall her mood symptoms as okay.  She is compliant on the olanzapine.  Patient however currently has uncontrolled diabetes melitis, metformin recently readjusted.  This is worrisome to her.  Patient denies any suicidality, homicidality or perceptual disturbances.  She reports sleep is overall okay.  Patient is compliant on medications.  She denies any other concerns today.  Visit Diagnosis:    ICD-10-CM   1. Bipolar disorder, in full remission, most recent  episode mixed (HCC)  F31.78 lithium 300 MG tablet  2. Chronic post-traumatic stress disorder (PTSD)  F43.12   3. Social anxiety disorder  F40.10   4. GAD (generalized anxiety disorder)  F41.1   5. At risk for prolonged QT interval syndrome  Z91.89 EKG 12-Lead  6. High risk medication use  Z79.899 Lithium level    Valproic acid level    TSH    CMP and Liver    CBC With Diff/Platelet    Past Psychiatric History: I have reviewed past psychiatric history from progress note on 01/04/2018.  Past trials of Celexa, Lexapro, olanzapine, Wellbutrin, Depakote, risperidone  Past Medical History:  Past Medical History:  Diagnosis Date  . Anxiety   . Bipolar 1 disorder (HCC)   . Vertigo     Past Surgical History:  Procedure Laterality Date  . CHOLECYSTECTOMY      Family Psychiatric History: Reviewed family psychiatric history from progress note on 01/04/2018  Family History:  Family History  Problem Relation Age of Onset  . Bipolar disorder Mother   . Down syndrome Brother   . Schizophrenia Maternal Aunt   . Bipolar disorder Paternal Grandmother   . Schizophrenia Paternal Grandmother     Social History: Reviewed social history from progress note on 01/04/2018 Social History   Socioeconomic History  . Marital status: Significant Other    Spouse name: Not on file  . Number of children: 9  . Years of education: Not on file  . Highest education level: Associate degree: occupational, Scientist, product/process development, or vocational program  Occupational History  Comment: not employed  Tobacco Use  . Smoking status: Never Smoker  . Smokeless tobacco: Never Used  Vaping Use  . Vaping Use: Never used  Substance and Sexual Activity  . Alcohol use: No  . Drug use: No  . Sexual activity: Yes  Other Topics Concern  . Not on file  Social History Narrative  . Not on file   Social Determinants of Health   Financial Resource Strain: Not on file  Food Insecurity: Not on file  Transportation Needs: Not on  file  Physical Activity: Not on file  Stress: Not on file  Social Connections: Not on file    Allergies:  Allergies  Allergen Reactions  . Levaquin [Levofloxacin In D5w] Itching  . Penicillins Rash    Has patient had a PCN reaction causing immediate rash, facial/tongue/throat swelling, SOB or lightheadedness with hypotension: No Has patient had a PCN reaction causing severe rash involving mucus membranes or skin necrosis: No Has patient had a PCN reaction that required hospitalization: No Has patient had a PCN reaction occurring within the last 10 years: No If all of the above answers are "NO", then may proceed with Cephalosporin use.     Metabolic Disorder Labs: Lab Results  Component Value Date   HGBA1C 8.2 (H) 06/16/2019   Lab Results  Component Value Date   PROLACTIN 18.1 03/23/2020   PROLACTIN 6.9 01/11/2018   Lab Results  Component Value Date   CHOL 256 (H) 03/23/2020   TRIG 360 (H) 03/23/2020   HDL 46 03/23/2020   CHOLHDL 5.6 03/23/2020   VLDL 72 (H) 03/23/2020   LDLCALC 138 (H) 03/23/2020   Lab Results  Component Value Date   TSH 1.039 03/23/2020   TSH 2.530 11/05/2018    Therapeutic Level Labs: Lab Results  Component Value Date   LITHIUM <0.06 (L) 03/23/2020   LITHIUM 0.2 (L) 11/30/2019   Lab Results  Component Value Date   VALPROATE 30 (L) 06/16/2019   VALPROATE 67 11/05/2018   No components found for:  CBMZ  Current Medications: Current Outpatient Medications  Medication Sig Dispense Refill  . clindamycin (CLEOCIN) 300 MG capsule clindamycin HCl 300 mg capsule    . diclofenac (VOLTAREN) 75 MG EC tablet     . divalproex (DEPAKOTE ER) 500 MG 24 hr tablet Take 1 tablet (500 mg total) by mouth at bedtime. 90 tablet 1  . hydrOXYzine (VISTARIL) 25 MG capsule Take 1 capsule (25 mg total) by mouth 3 (three) times daily as needed. 90 capsule 1  . ketoconazole (NIZORAL) 2 % cream Apply topically.    Marland Kitchen. ketoconazole (NIZORAL) 2 % cream ketoconazole 2 %  topical cream  APPLY TOPICALLY TWICE DAILY FOR 10 DAYS. REPEAT FOR ADDITIONAL 10 DAYS AS NEEDED    . lithium 300 MG tablet Take 1 tablet (300 mg total) by mouth daily with breakfast. 90 tablet 1  . medroxyPROGESTERone (DEPO-PROVERA) 150 MG/ML injection every 3 (three) months    . meloxicam (MOBIC) 15 MG tablet Take 1 tablet (15 mg total) by mouth daily. 30 tablet 0  . metFORMIN (GLUCOPHAGE) 500 MG tablet Take 1,500 mg by mouth.    . methocarbamol (ROBAXIN) 500 MG tablet Take 1 tablet (500 mg total) by mouth at bedtime. 16 tablet 0  . mupirocin ointment (BACTROBAN) 2 % mupirocin 2 % topical ointment    . NEOMYCIN-POLYMYXIN-HYDROCORTISONE (CORTISPORIN) 1 % SOLN OTIC solution INSTILL 3 DROPS INTO RIGHT EAR 4 TIMES DAILY FOR 10 DAYS    . OLANZapine (  ZYPREXA) 10 MG tablet Take 1 tablet (10 mg total) by mouth at bedtime. 30 tablet 1  . PARoxetine (PAXIL) 40 MG tablet TAKE 1 TABLET BY MOUTH ONCE DAILY IN THE MORNING 90 tablet 0  . silver sulfADIAZINE (SILVADENE) 1 % cream Apply topically.     No current facility-administered medications for this visit.     Musculoskeletal: Strength & Muscle Tone: UTA Gait & Station: UTA Patient leans: N/A  Psychiatric Specialty Exam: Review of Systems  Psychiatric/Behavioral: Negative for agitation, behavioral problems, dysphoric mood, hallucinations, sleep disturbance and suicidal ideas. The patient is not nervous/anxious and is not hyperactive.   All other systems reviewed and are negative.   There were no vitals taken for this visit.There is no height or weight on file to calculate BMI.  General Appearance: Casual  Eye Contact:  Fair  Speech:  Normal Rate  Volume:  Normal  Mood:  Euthymic  Affect:  Congruent  Thought Process:  Goal Directed and Descriptions of Associations: Intact  Orientation:  Full (Time, Place, and Person)  Thought Content: Logical   Suicidal Thoughts:  No  Homicidal Thoughts:  No  Memory:  Immediate;   Fair Recent;    Fair Remote;   Fair  Judgement:  Fair  Insight:  Fair  Psychomotor Activity:  Normal  Concentration:  Concentration: Fair and Attention Span: Fair  Recall:  [employer] of Knowledge: Fair  Language: Fair  Akathisia:  No  Handed:  Right  AIMS (if indicated): UTA  Assets:  Communication Skills Desire for Improvement Housing Social Support  ADL's:  Intact  Cognition: WNL  Sleep:  Fair   Screenings: PHQ2-9   Flowsheet Row Video Visit from 07/11/2020 in [facility] Video Visit from 04/18/2020 in [facility] Video Visit from 03/21/2020 in [facility]  PHQ-2 Total Score 0 1 0    Flowsheet Row Video Visit from 07/11/2020 in [facility] Video Visit from 04/18/2020 in [facility] Video Visit from 03/21/2020 in [facility]  C-SSRS RISK CATEGORY Low Risk Low Risk No Risk       Assessment and Plan: Pamela Rasmussen is a 40 year old Caucasian female, lives in [place], has a history of bipolar disorder, PTSD, social anxiety disorder was evaluated by telemedicine today.  Patient is currently struggling with uncontrolled diabetes melitis, she is on psychotropic medications like olanzapine which could likely have an impact on her hemoglobin A1c.  Discussed plan as noted below.  Plan Bipolar disorder in full remission Lithium 300 mg p.o. daily with breakfast Depakote 500 mg p.o. daily Discussed changing her olanzapine to Geodon.  However she will benefit from an EKG prior to making this change.  PTSD-stable Paxil 40 mg p.o. daily  Social anxiety disorder-stable Paxil as prescribed  GAD-stable Paxil 40 mg p.o. daily Continue CBT as needed  High risk medication use-will order lithium level, CMP, TSH, CBC with differential, Depakote level.  She will go to [facility].  At risk for prolonged QT syndrome-we will order  EKG.  Once I review EKG we will consider changing her olanzapine to Geodon due to her uncontrolled diabetes mellitus.  Provided medication education.  Follow-up in clinic in 4 to 5 weeks or sooner if needed.  This note was generated in part or whole with voice recognition software. Voice recognition is usually quite accurate but there are transcription errors that can and very often do occur. I apologize for any typographical errors that  were not detected and corrected.     Jomarie Longs, MD 07/11/2020, 12:59 PM

## 2020-07-23 ENCOUNTER — Other Ambulatory Visit: Payer: Self-pay | Admitting: Psychiatry

## 2020-07-23 DIAGNOSIS — F4010 Social phobia, unspecified: Secondary | ICD-10-CM

## 2020-07-23 DIAGNOSIS — F411 Generalized anxiety disorder: Secondary | ICD-10-CM

## 2020-07-23 DIAGNOSIS — F4312 Post-traumatic stress disorder, chronic: Secondary | ICD-10-CM

## 2020-07-24 LAB — CBC WITH DIFF/PLATELET
Basophils Absolute: 0.1 10*3/uL (ref 0.0–0.2)
Basos: 1 %
EOS (ABSOLUTE): 0.4 10*3/uL (ref 0.0–0.4)
Eos: 4 %
Hematocrit: 42.6 % (ref 34.0–46.6)
Hemoglobin: 14.3 g/dL (ref 11.1–15.9)
Immature Grans (Abs): 0.1 10*3/uL (ref 0.0–0.1)
Immature Granulocytes: 1 %
Lymphocytes Absolute: 2.9 10*3/uL (ref 0.7–3.1)
Lymphs: 24 %
MCH: 32 pg (ref 26.6–33.0)
MCHC: 33.6 g/dL (ref 31.5–35.7)
MCV: 95 fL (ref 79–97)
Monocytes Absolute: 1 10*3/uL — ABNORMAL HIGH (ref 0.1–0.9)
Monocytes: 8 %
Neutrophils Absolute: 7.7 10*3/uL — ABNORMAL HIGH (ref 1.4–7.0)
Neutrophils: 62 %
Platelets: 262 10*3/uL (ref 150–450)
RBC: 4.47 x10E6/uL (ref 3.77–5.28)
RDW: 12.7 % (ref 11.7–15.4)
WBC: 12.2 10*3/uL — ABNORMAL HIGH (ref 3.4–10.8)

## 2020-07-24 LAB — CMP AND LIVER
ALT: 26 IU/L (ref 0–32)
AST: 17 IU/L (ref 0–40)
Albumin: 3.9 g/dL (ref 3.8–4.8)
Alkaline Phosphatase: 108 IU/L (ref 44–121)
BUN: 8 mg/dL (ref 6–20)
Bilirubin Total: <0.2 mg/dL (ref 0.0–1.2)
Bilirubin, Direct: <0.1 mg/dL (ref 0.00–0.40)
CO2: 17 mmol/L — ABNORMAL LOW (ref 20–29)
Calcium: 9.2 mg/dL (ref 8.7–10.2)
Chloride: 101 mmol/L (ref 96–106)
Creatinine, Ser: 0.8 mg/dL (ref 0.57–1.00)
Glucose: 239 mg/dL — ABNORMAL HIGH (ref 65–99)
Potassium: 3.9 mmol/L (ref 3.5–5.2)
Sodium: 136 mmol/L (ref 134–144)
Total Protein: 6.8 g/dL (ref 6.0–8.5)
eGFR: 96 mL/min/{1.73_m2} (ref >59)

## 2020-07-24 LAB — TSH: TSH: 2.09 u[IU]/mL (ref 0.450–4.500)

## 2020-07-24 LAB — VALPROIC ACID LEVEL: Valproic Acid Lvl: 29 ug/mL — ABNORMAL LOW (ref 50–100)

## 2020-08-14 ENCOUNTER — Other Ambulatory Visit: Payer: Self-pay | Admitting: Psychiatry

## 2020-08-14 DIAGNOSIS — F4312 Post-traumatic stress disorder, chronic: Secondary | ICD-10-CM

## 2020-08-16 ENCOUNTER — Telehealth: Payer: BLUE CROSS/BLUE SHIELD | Admitting: Psychiatry

## 2020-09-04 ENCOUNTER — Telehealth: Payer: Self-pay

## 2020-09-04 DIAGNOSIS — F3178 Bipolar disorder, in full remission, most recent episode mixed: Secondary | ICD-10-CM

## 2020-09-04 MED ORDER — OLANZAPINE 10 MG PO TABS: 10.0000 mg | ORAL_TABLET | Freq: Every day | ORAL | 1 refills | Status: DC

## 2020-09-04 NOTE — Telephone Encounter (Signed)
pt left a message that she needs a refill on the zyprexa

## 2020-09-04 NOTE — Telephone Encounter (Signed)
I have sent olanzapine to pharmacy. 

## 2020-09-06 ENCOUNTER — Telehealth: Payer: BLUE CROSS/BLUE SHIELD | Admitting: Psychiatry

## 2020-09-06 ENCOUNTER — Other Ambulatory Visit: Payer: Self-pay

## 2020-10-01 ENCOUNTER — Emergency Department
Admission: EM | Admit: 2020-10-01 | Discharge: 2020-10-01 | Disposition: A | Discharge status: Home / Self Care | Payer: BLUE CROSS/BLUE SHIELD | Attending: Emergency Medicine | Admitting: Emergency Medicine

## 2020-10-01 ENCOUNTER — Other Ambulatory Visit: Payer: Self-pay

## 2020-10-01 DIAGNOSIS — E1165 Type 2 diabetes mellitus with hyperglycemia: Secondary | ICD-10-CM | POA: Diagnosis not present

## 2020-10-01 DIAGNOSIS — N3000 Acute cystitis without hematuria: Secondary | ICD-10-CM | POA: Insufficient documentation

## 2020-10-01 DIAGNOSIS — E119 Type 2 diabetes mellitus without complications: Secondary | ICD-10-CM

## 2020-10-01 DIAGNOSIS — Z7984 Long term (current) use of oral hypoglycemic drugs: Secondary | ICD-10-CM | POA: Diagnosis not present

## 2020-10-01 DIAGNOSIS — Z79899 Other long term (current) drug therapy: Secondary | ICD-10-CM | POA: Insufficient documentation

## 2020-10-01 DIAGNOSIS — R739 Hyperglycemia, unspecified: Secondary | ICD-10-CM

## 2020-10-01 DIAGNOSIS — IMO0002 Reserved for concepts with insufficient information to code with codable children: Secondary | ICD-10-CM

## 2020-10-01 LAB — BASIC METABOLIC PANEL
Anion gap: 8 (ref 5–15)
BUN: 9 mg/dL (ref 6–20)
CO2: 26 mmol/L (ref 22–32)
Calcium: 9.5 mg/dL (ref 8.9–10.3)
Chloride: 100 mmol/L (ref 98–111)
Creatinine, Ser: 0.99 mg/dL (ref 0.44–1.00)
GFR, Estimated: >60 mL/min (ref >60)
Glucose, Bld: 321 mg/dL — ABNORMAL HIGH (ref 70–99)
Potassium: 3.3 mmol/L — ABNORMAL LOW (ref 3.5–5.1)
Sodium: 134 mmol/L — ABNORMAL LOW (ref 135–145)

## 2020-10-01 LAB — URINALYSIS, COMPLETE (UACMP) WITH MICROSCOPIC
Bilirubin Urine: NEGATIVE
Glucose, UA: >=500 mg/dL — AB
Hgb urine dipstick: NEGATIVE
Ketones, ur: 5 mg/dL — AB
Nitrite: NEGATIVE
Protein, ur: NEGATIVE mg/dL
Specific Gravity, Urine: 1.021 (ref 1.005–1.030)
pH: 6 (ref 5.0–8.0)

## 2020-10-01 LAB — CBC
HCT: 40.7 % (ref 36.0–46.0)
Hemoglobin: 14.2 g/dL (ref 12.0–15.0)
MCH: 32.4 pg (ref 26.0–34.0)
MCHC: 34.9 g/dL (ref 30.0–36.0)
MCV: 92.9 fL (ref 80.0–100.0)
Platelets: 238 10*3/uL (ref 150–400)
RBC: 4.38 MIL/uL (ref 3.87–5.11)
RDW: 13 % (ref 11.5–15.5)
WBC: 12.3 10*3/uL — ABNORMAL HIGH (ref 4.0–10.5)
nRBC: 0 % (ref 0.0–0.2)

## 2020-10-01 LAB — HEPATIC FUNCTION PANEL
ALT: 54 U/L — ABNORMAL HIGH (ref 0–44)
AST: 36 U/L (ref 15–41)
Albumin: 3.9 g/dL (ref 3.5–5.0)
Alkaline Phosphatase: 94 U/L (ref 38–126)
Bilirubin, Direct: <0.1 mg/dL (ref 0.0–0.2)
Total Bilirubin: 0.6 mg/dL (ref 0.3–1.2)
Total Protein: 7.3 g/dL (ref 6.5–8.1)

## 2020-10-01 LAB — CBG MONITORING, ED
Glucose-Capillary: 364 mg/dL — ABNORMAL HIGH (ref 70–99)
Glucose-Capillary: 250 mg/dL — ABNORMAL HIGH (ref 70–99)

## 2020-10-01 LAB — PREGNANCY, URINE: Preg Test, Ur: NEGATIVE

## 2020-10-01 MED ORDER — CEPHALEXIN 500 MG PO CAPS
500.0000 mg | ORAL_CAPSULE | Freq: Once | ORAL | Status: AC
  Administered 2020-10-01: 500 mg via ORAL
  Filled 2020-10-01: qty 1

## 2020-10-01 MED ORDER — CEPHALEXIN 500 MG PO CAPS: 500.0000 mg | ORAL_CAPSULE | Freq: Two times a day (BID) | ORAL | 0 refills | Status: AC

## 2020-10-01 MED ORDER — SODIUM CHLORIDE 0.9 % IV BOLUS
1000.0000 mL | Freq: Once | INTRAVENOUS | Status: AC
  Administered 2020-10-01: 1000 mL via INTRAVENOUS

## 2020-10-01 NOTE — ED Triage Notes (Signed)
Pt to ED via POV. Pt stating she was at work and started not feeling well. Pt hx DM. PPt endorses blurry vision, dizziness, increased urination and thirst. CBG 389. VSS

## 2020-10-01 NOTE — ED Notes (Signed)
MD notified and aware of BGL

## 2020-10-01 NOTE — Discharge Instructions (Addendum)
Please follow a diabetic diet.  Follow up with primary care.  Return to the ER for symptoms of concern if unable to schedule an appointment.

## 2020-10-01 NOTE — ED Provider Notes (Signed)
[facility] Emergency Department Provider Note ____________________________________________   Event Date/Time   First MD Initiated Contact with Patient 10/01/20 1640     (approximate)  I have reviewed the triage vital signs and the nursing notes.   HISTORY  Chief Complaint Hyperglycemia  HPI Pamela Rasmussen is a 40 y.o. female with history of diabetes, anxiety, bipolar 1 and vertigo presents to the emergency department for treatment and evaluation of hyperglycemia.  While at work today, the patient states that she began to feel dizzy and as if she was "floating."  She states that as of late she has been very thirsty and has had to urinate frequency.  No dysuria.  No fever.  She is taking her metformin 3 times per day as prescribed but states that her glucose is always above 300.         Past Medical History:  Diagnosis Date   Anxiety    Bipolar 1 disorder [facility])    Vertigo     Patient Active Problem List   Diagnosis Date Noted   Diabetes type 2, uncontrolled (HCC) 10/01/2020   At risk for prolonged QT interval syndrome 07/11/2020   GAD (generalized anxiety disorder) 11/03/2019   Bipolar disorder, in full remission, most recent episode mixed (HCC) 01/25/2019   Bipolar disorder, in partial remission, most recent episode mixed (HCC) 01/06/2019   Morbid (severe) obesity due to excess calories (HCC) 12/23/2018   High risk medication use 10/26/2018   Bipolar 1 disorder, mixed, moderate (HCC) 10/05/2018   Chronic post-traumatic stress disorder (PTSD) 10/05/2018   Social anxiety disorder 10/05/2018   Bipolar depression (HCC) 05/05/2017   BMI 45.0-49.9, adult (HCC) 05/05/2017    Past Surgical History:  Procedure Laterality Date   CHOLECYSTECTOMY      Prior to Admission medications   Medication Sig Start Date End Date Taking? Authorizing Provider  cephALEXin (KEFLEX) 500 MG capsule Take 1 capsule (500 mg total) by mouth 2 (two) times daily for  7 days. 10/01/20 10/08/20 Yes Bing Duffey B, FNP  clindamycin (CLEOCIN) 300 MG capsule clindamycin HCl 300 mg capsule    [provider]  diclofenac (VOLTAREN) 75 MG EC tablet  07/12/18   [provider]  divalproex (DEPAKOTE ER) 500 MG 24 hr tablet Take 1 tablet (500 mg total) by mouth at bedtime. 03/21/20   Jomarie Longs, MD  hydrOXYzine (VISTARIL) 25 MG capsule Take 1 capsule (25 mg total) by mouth 3 (three) times daily as needed. 01/04/20   Jomarie Longs, MD  ketoconazole (NIZORAL) 2 % cream Apply topically. 03/05/20   [provider]  ketoconazole (NIZORAL) 2 % cream ketoconazole 2 % topical cream  APPLY TOPICALLY TWICE DAILY FOR 10 DAYS. REPEAT FOR ADDITIONAL 10 DAYS AS NEEDED    [provider]  lithium 300 MG tablet Take 1 tablet (300 mg total) by mouth daily with breakfast. 07/11/20   Jomarie Longs, MD  medroxyPROGESTERone (DEPO-PROVERA) 150 MG/ML injection every 3 (three) months    [provider]  meloxicam (MOBIC) 15 MG tablet Take 1 tablet (15 mg total) by mouth daily. 01/16/20   Cuthriell, Delorise Royals, PA-C  metFORMIN (GLUCOPHAGE) 500 MG tablet Take 1,500 mg by mouth. 04/04/20 04/04/21  [provider]  methocarbamol (ROBAXIN) 500 MG tablet Take 1 tablet (500 mg total) by mouth at bedtime. 01/16/20   Cuthriell, Delorise Royals, PA-C  mupirocin ointment (BACTROBAN) 2 % mupirocin 2 % topical ointment    [provider]  NEOMYCIN-POLYMYXIN-HYDROCORTISONE (CORTISPORIN) 1 %  SOLN OTIC solution INSTILL 3 DROPS INTO RIGHT EAR 4 TIMES DAILY FOR 10 DAYS 08/16/19   [provider]  OLANZapine (ZYPREXA) 10 MG tablet Take 1 tablet (10 mg total) by mouth at bedtime. 09/04/20   Jomarie Longs, MD  PARoxetine (PAXIL) 40 MG tablet TAKE 1 TABLET BY MOUTH ONCE DAILY IN THE MORNING 07/24/20   Jomarie Longs, MD  silver sulfADIAZINE (SILVADENE) 1 % cream Apply topically. 06/13/20   [provider]    Allergies Levaquin [levofloxacin in  d5w] and Penicillins  Family History  Problem Relation Age of Onset   Bipolar disorder Mother    Down syndrome Brother    Schizophrenia Maternal Aunt    Bipolar disorder Paternal Grandmother    Schizophrenia Paternal Grandmother     Social History Social History   Tobacco Use   Smoking status: Never   Smokeless tobacco: Never  Vaping Use   Vaping Use: Never used  Substance Use Topics   Alcohol use: No   Drug use: No    Review of Systems  Constitutional: No fever/chills Eyes: No visual changes. ENT: No sore throat. Cardiovascular: Denies chest pain. Respiratory: Denies shortness of breath. Gastrointestinal: No abdominal pain.  No nausea, no vomiting.  No diarrhea.  No constipation. Genitourinary: Negative for dysuria.  Positive for polyuria Musculoskeletal: Negative for back pain. Skin: Negative for rash. Neurological: Negative for headaches, focal weakness or numbness.  ____________________________________________   PHYSICAL EXAM:  VITAL SIGNS: ED Triage Vitals  Enc Vitals Group     BP 10/01/20 1521 138/80     Pulse Rate 10/01/20 1519 98     Resp 10/01/20 1519 18     Temp 10/01/20 1519 98.1 F (36.7 C)     Temp Source 10/01/20 1519 Oral     SpO2 10/01/20 1519 98 %     Weight 10/01/20 1519 259 lb (117.5 kg)     Height 10/01/20 1519 5\' 4"  (1.626 m)     Head Circumference --      Peak Flow --      Pain Score 10/01/20 1516 0     Pain Loc --      Pain Edu? --      Excl. in GC? --     Constitutional: Alert and oriented. Well appearing and in no acute distress. Eyes: Conjunctivae are normal. PERRL. EOMI. Head: Atraumatic. Nose: No congestion/rhinnorhea. Mouth/Throat: Mucous membranes are moist.  Oropharynx non-erythematous. Neck: No stridor.   Hematological/Lymphatic/Immunilogical: No cervical lymphadenopathy. Cardiovascular: Normal rate, regular rhythm. Grossly normal heart sounds.  Good peripheral circulation. Respiratory: Normal respiratory effort.   No retractions. Lungs CTAB. Gastrointestinal: Soft and nontender. No distention. No abdominal bruits. No CVA tenderness. Genitourinary:  Musculoskeletal: No lower extremity tenderness nor edema.  No joint effusions. Neurologic:  Normal speech and language. No gross focal neurologic deficits are appreciated. No gait instability. Skin:  Skin is warm, dry and intact. No rash noted. Psychiatric: Mood and affect are normal. Speech and behavior are normal.  ____________________________________________   LABS (all labs ordered are listed, but only abnormal results are displayed)  Labs Reviewed  BASIC METABOLIC PANEL - Abnormal; Notable for the following components:      Result Value   Sodium 134 (*)    Potassium 3.3 (*)    Glucose, Bld 321 (*)    All other components within normal limits  CBC - Abnormal; Notable for the following components:   WBC 12.3 (*)    All other components within normal limits  URINALYSIS, COMPLETE (UACMP) WITH MICROSCOPIC - Abnormal; Notable for the following components:   Color, Urine STRAW (*)    APPearance CLEAR (*)    Glucose, UA >=500 (*)    Ketones, ur 5 (*)    Leukocytes,Ua MODERATE (*)    Bacteria, UA RARE (*)    All other components within normal limits  HEPATIC FUNCTION PANEL - Abnormal; Notable for the following components:   ALT 54 (*)    All other components within normal limits  CBG MONITORING, ED - Abnormal; Notable for the following components:   Glucose-Capillary 364 (*)    All other components within normal limits  CBG MONITORING, ED - Abnormal; Notable for the following components:   Glucose-Capillary 250 (*)    All other components within normal limits  PREGNANCY, URINE  CBG MONITORING, ED   ____________________________________________  EKG  Not indicated ____________________________________________  RADIOLOGY  ED MD interpretation:    Not indicated.  I, Kem Boroughs, personally viewed and evaluated these images (plain  radiographs) as part of my medical decision making, as well as reviewing the written report by the radiologist.  Official radiology report(s): No results found.  ____________________________________________   PROCEDURES  Procedure(s) performed (including Critical Care):  Procedures  ____________________________________________   INITIAL IMPRESSION / ASSESSMENT AND PLAN     40 year old female presenting to the emergency department for treatment and evaluation of hyperglycemia.  See HPI for further details.  CBG in triage was 364.  Plan will be to give 1 L normal saline and await results of labs and urinalysis.  DIFFERENTIAL DIAGNOSIS  Uncontrolled DM, DKA  ED COURSE  Urinalysis shows moderate leukocytes, white cells, and rare bacteria with greater than 500 glucose.  With patient's report of dizziness and urinary frequency, will treat for mild acute cystitis with Keflex.  Initial dose provided in the emergency department tonight as a pharmacy will be closed before she can get there.  Hyperglycemia now down to 250 after 1 liter of NS. She will be advised to continue her Metformin as prescribed and adhere to a diabetic diet. She is to follow up with primary care this week as scheduled.    ___________________________________________   FINAL CLINICAL IMPRESSION(S) / ED DIAGNOSES  Final diagnoses:  Hyperglycemia  Acute cystitis without hematuria     ED Discharge Orders          Ordered    cephALEXin (KEFLEX) 500 MG capsule  2 times daily        10/01/20 1856             Pamela Rasmussen was evaluated in Emergency Department on 10/01/2020 for the symptoms described in the history of present illness. She was evaluated in the context of the global COVID-19 pandemic, which necessitated consideration that the patient might be at risk for infection with the SARS-CoV-2 virus that causes COVID-19. Institutional protocols and algorithms that pertain to the evaluation of  patients at risk for COVID-19 are in a state of rapid change based on information released by regulatory bodies including the CDC and federal and state organizations. These policies and algorithms were followed during the patient's care in the ED.   Note:  This document was prepared using Dragon voice recognition software and may include unintentional dictation errors.    Chinita Pester, FNP 10/01/20 1931    Phineas Semen, MD 10/01/20 [phone]

## 2020-10-01 NOTE — ED Notes (Signed)
Received call from lab stating that they need recollect for labs due to numbers missing and incorrect. RN Santina Evans informed at this time.

## 2020-10-01 NOTE — ED Notes (Signed)
Care transferred, report received from Catherine, RN 

## 2021-03-21 ENCOUNTER — Other Ambulatory Visit: Payer: Self-pay

## 2021-03-21 ENCOUNTER — Emergency Department
Admission: EM | Admit: 2021-03-21 | Discharge: 2021-03-21 | Disposition: A | Discharge status: Home / Self Care | Payer: BLUE CROSS/BLUE SHIELD | Attending: Emergency Medicine | Admitting: Emergency Medicine

## 2021-03-21 DIAGNOSIS — N3000 Acute cystitis without hematuria: Secondary | ICD-10-CM | POA: Insufficient documentation

## 2021-03-21 DIAGNOSIS — E860 Dehydration: Secondary | ICD-10-CM | POA: Insufficient documentation

## 2021-03-21 DIAGNOSIS — D72829 Elevated white blood cell count, unspecified: Secondary | ICD-10-CM | POA: Insufficient documentation

## 2021-03-21 DIAGNOSIS — E1165 Type 2 diabetes mellitus with hyperglycemia: Secondary | ICD-10-CM | POA: Diagnosis not present

## 2021-03-21 DIAGNOSIS — Z7984 Long term (current) use of oral hypoglycemic drugs: Secondary | ICD-10-CM | POA: Diagnosis not present

## 2021-03-21 DIAGNOSIS — R739 Hyperglycemia, unspecified: Secondary | ICD-10-CM

## 2021-03-21 LAB — BASIC METABOLIC PANEL
Anion gap: 11 (ref 5–15)
BUN: 9 mg/dL (ref 6–20)
CO2: 19 mmol/L — ABNORMAL LOW (ref 22–32)
Calcium: 8.8 mg/dL — ABNORMAL LOW (ref 8.9–10.3)
Chloride: 103 mmol/L (ref 98–111)
Creatinine, Ser: 1 mg/dL (ref 0.44–1.00)
GFR, Estimated: >60 mL/min (ref >60)
Glucose, Bld: 513 mg/dL (ref 70–99)
Potassium: 4.3 mmol/L (ref 3.5–5.1)
Sodium: 133 mmol/L — ABNORMAL LOW (ref 135–145)

## 2021-03-21 LAB — URINALYSIS, ROUTINE W REFLEX MICROSCOPIC
Bilirubin Urine: NEGATIVE
Glucose, UA: >=500 mg/dL — AB
Hgb urine dipstick: NEGATIVE
Ketones, ur: NEGATIVE mg/dL
Nitrite: NEGATIVE
Protein, ur: NEGATIVE mg/dL
Specific Gravity, Urine: 1.021 (ref 1.005–1.030)
WBC, UA: >50 WBC/hpf — ABNORMAL HIGH (ref 0–5)
pH: 6 (ref 5.0–8.0)

## 2021-03-21 LAB — CBC
HCT: 43.6 % (ref 36.0–46.0)
Hemoglobin: 14.4 g/dL (ref 12.0–15.0)
MCH: 31.6 pg (ref 26.0–34.0)
MCHC: 33 g/dL (ref 30.0–36.0)
MCV: 95.8 fL (ref 80.0–100.0)
Platelets: 244 10*3/uL (ref 150–400)
RBC: 4.55 MIL/uL (ref 3.87–5.11)
RDW: 12.6 % (ref 11.5–15.5)
WBC: 11.1 10*3/uL — ABNORMAL HIGH (ref 4.0–10.5)
nRBC: 0 % (ref 0.0–0.2)

## 2021-03-21 LAB — LITHIUM LEVEL: Lithium Lvl: <0.06 mmol/L — ABNORMAL LOW (ref 0.60–1.20)

## 2021-03-21 LAB — HEPATIC FUNCTION PANEL
ALT: 35 U/L (ref 0–44)
AST: 37 U/L (ref 15–41)
Albumin: 3.5 g/dL (ref 3.5–5.0)
Alkaline Phosphatase: 110 U/L (ref 38–126)
Bilirubin, Direct: <0.1 mg/dL (ref 0.0–0.2)
Total Bilirubin: 0.5 mg/dL (ref 0.3–1.2)
Total Protein: 6.7 g/dL (ref 6.5–8.1)

## 2021-03-21 LAB — CBG MONITORING, ED
Glucose-Capillary: 560 mg/dL (ref 70–99)
Glucose-Capillary: 250 mg/dL — ABNORMAL HIGH (ref 70–99)

## 2021-03-21 LAB — PREGNANCY, URINE: Preg Test, Ur: NEGATIVE

## 2021-03-21 LAB — LIPASE, BLOOD: Lipase: 70 U/L — ABNORMAL HIGH (ref 11–51)

## 2021-03-21 MED ORDER — ONDANSETRON 4 MG PO TBDP
4.0000 mg | ORAL_TABLET | Freq: Once | ORAL | Status: AC
  Administered 2021-03-21: 4 mg via ORAL
  Filled 2021-03-21: qty 1

## 2021-03-21 MED ORDER — LACTATED RINGERS IV BOLUS
1000.0000 mL | Freq: Once | INTRAVENOUS | Status: AC
  Administered 2021-03-21: 1000 mL via INTRAVENOUS

## 2021-03-21 MED ORDER — SODIUM CHLORIDE 0.9 % IV SOLN
1.0000 g | Freq: Once | INTRAVENOUS | Status: AC
  Administered 2021-03-21: 1 g via INTRAVENOUS
  Filled 2021-03-21: qty 10

## 2021-03-21 MED ORDER — ONDANSETRON HCL 4 MG PO TABS: 4.0000 mg | ORAL_TABLET | Freq: Three times a day (TID) | ORAL | 0 refills | Status: DC | PRN

## 2021-03-21 MED ORDER — SULFAMETHOXAZOLE-TRIMETHOPRIM 800-160 MG PO TABS: 1.0000 | ORAL_TABLET | Freq: Two times a day (BID) | ORAL | 0 refills | Status: AC

## 2021-03-21 NOTE — ED Notes (Signed)
This RN called lab to request a phlebotomist come draw the lithium level, as the one sent was hemolyzed.

## 2021-03-21 NOTE — ED Notes (Signed)
Red to tube for Lithium level sent to lab.

## 2021-03-21 NOTE — ED Provider Notes (Signed)
[facility] Provider Note    Event Date/Time   First MD Initiated Contact with Patient 03/21/21 1245     (approximate)   History   Hyperglycemia   HPI  Coryn Resendes Hett is a 41 y.o. female with a past medical history of bipolar disorder on Depakote and lithium as well as type 2 diabetes on Ozempic and metformin who presents for evaluation of approximate 2 days of some nausea and weakness.  No fevers, cough, chest pain, vomiting, diarrhea, abdominal pain, back pain rash or extremity pain.  No illicit drug use.  Patient seems to be under the impression that her Ozempic is insulin but she is not taking any other insulin or medications for this after some discussion it seems she is not actually on any insulin.  No history of DKA.      Physical Exam  Triage Vital Signs: ED Triage Vitals  Enc Vitals Group     BP 03/21/21 1058 (!) 133/93     Pulse Rate 03/21/21 1058 (!) 109     Resp 03/21/21 1058 18     Temp 03/21/21 1058 98 F (36.7 C)     Temp src --      SpO2 03/21/21 1058 96 %     Weight --      Height --      Head Circumference --      Peak Flow --      Pain Score 03/21/21 1056 4     Pain Loc --      Pain Edu? --      Excl. in [place]? --     Most recent vital signs: Vitals:   03/21/21 1058  BP: (!) 133/93  Pulse: (!) 109  Resp: 18  Temp: 98 F (36.7 C)  SpO2: 96%    General: Awake, no distress.  CV:  No murmurs.  2+ radial pulses.  Slightly tachycardic.  Prolonged capillary refill time dry mucous membranes. Resp:  Normal effort.  Clear bilaterally. Abd:  No distention.  Throughout.  Soft throughout. Other:  No CVA tenderness.   ED Results / Procedures / Treatments  Labs (all labs ordered are listed, but only abnormal results are displayed) Labs Reviewed  BASIC METABOLIC PANEL - Abnormal; Notable for the following components:      Result Value   Sodium 133 (*)    CO2 19 (*)    Glucose, Bld 513 (*)    Calcium 8.8 (*)    All  other components within normal limits  CBC - Abnormal; Notable for the following components:   WBC 11.1 (*)    All other components within normal limits  URINALYSIS, ROUTINE W REFLEX MICROSCOPIC - Abnormal; Notable for the following components:   Color, Urine STRAW (*)    APPearance HAZY (*)    Glucose, UA >=500 (*)    Leukocytes,Ua LARGE (*)    WBC, UA >50 (*)    Bacteria, UA RARE (*)    All other components within normal limits  LIPASE, BLOOD - Abnormal; Notable for the following components:   Lipase 70 (*)    All other components within normal limits  LITHIUM LEVEL - Abnormal; Notable for the following components:   Lithium Lvl <0.06 (*)    All other components within normal limits  CBG MONITORING, ED - Abnormal; Notable for the following components:   Glucose-Capillary 560 (*)    All other components within normal limits  URINE CULTURE  HEPATIC FUNCTION PANEL  PREGNANCY, URINE  CBG MONITORING, ED     EKG  ECG is remarkable sinus rhythm with a ventricular rate of 85, normal axis, unremarkable intervals without evidence of acute ischemia or significant arrhythmia   RADIOLOGY    PROCEDURES:  Critical Care performed: No  Procedures    MEDICATIONS ORDERED IN ED: Medications  lactated ringers bolus 1,000 mL (0 mLs Intravenous Stopped 03/21/21 1410)  ondansetron (ZOFRAN-ODT) disintegrating tablet 4 mg (4 mg Oral Given 03/21/21 1310)  cefTRIAXone (ROCEPHIN) 1 g in sodium chloride 0.9 % 100 mL IVPB (0 g Intravenous Stopped 03/21/21 1508)     IMPRESSION / MDM / ASSESSMENT AND PLAN / ED COURSE  I reviewed the triage vital signs and the nursing notes.                              Differential diagnosis includes, but is not limited to DKA, HHS versus other metabolic derangement, atypical presentation for ACS and acute infectious process such as cystitis.  Patient has no respiratory symptoms fever cough to suggest pneumonia.  She does not appear septic or toxic.  She does  appear mildly dehydrated.  EKG is unremarkable for any evidence of ischemia.   BMP obtained shows a glucose of 133, bicarb 19 and a glucose of 513 with an anion gap of 11.  This is not suggestive of DKA.  No other significant electrolyte or metabolic derangements.  CBC shows WBC count of 11.1 without evidence of acute anemia and normal platelets.  UA shows glucose and large leukocyte esterase with 3-50 WBCs and bacteria.  This is concerning for acute cystitis possibly precipitating patient's creased insulin resistance from baseline hyperglycemia.  Lipase is 70 and absence of any abdominal tenderness is not suggestive of pancreatitis.  [place] function panel is unremarkable for hepatitis or cholestatic process.  Pregnancy test is negative.  Lithium levels undetectable not suggestive of toxicity.  Repeat blood sugar after some fluids was 250.  Patient is feeling better and tolerating p.o. without difficulty on reassessment.  I suspect likely some hyperglycemia and dehydration in the setting of acute UTI.  She was given a dose of Rocephin and urine culture sent emergency room.  Will write for short course of Bactrim.  We will also provide some Zofran.  Emphasized importance of hydrating only with water.  Discussed returning for any new or worsening of symptoms.  Also emphasized importance of sugars recheck by PCP in 2 to 3 days.  Discharged in stable condition.  Strict return precautions advised and discussed.     FINAL CLINICAL IMPRESSION(S) / ED DIAGNOSES   Final diagnoses:  Hyperglycemia  Acute cystitis without hematuria  Dehydration     Rx / DC Orders   ED Discharge Orders          Ordered    ondansetron (ZOFRAN) 4 MG tablet  Every 8 hours PRN        03/21/21 1556    sulfamethoxazole-trimethoprim (BACTRIM DS) 800-160 MG tablet  2 times daily        03/21/21 1556             Note:  This document was prepared using Dragon voice recognition software and may include unintentional  dictation errors.   Lucrezia Starch, MD 03/21/21 [phone]

## 2021-03-21 NOTE — ED Notes (Signed)
LR is not compatible with rocephin, so rocephin is delayed until IVF bolus finished.

## 2021-03-21 NOTE — ED Notes (Signed)
Red top tube sent to lab for lithium level.

## 2021-03-21 NOTE — ED Triage Notes (Addendum)
Pt comes wit c/o hyperglycemia and abdominal pain. Pt states she checked her sugar this am and it read 5oo something. Pt is diabetic. Pt states she takes insulin once a week. Pt states 1 500 pill of metformin every day.  Pt states some nausea.

## 2021-03-21 NOTE — ED Notes (Signed)
This RN called & spoke with lab re: adding urine pregnancy test to urine already down there.

## 2021-03-23 LAB — URINE CULTURE: Culture: >=100000 — AB

## 2021-04-11 ENCOUNTER — Other Ambulatory Visit: Payer: Self-pay

## 2021-04-11 ENCOUNTER — Emergency Department
Admission: EM | Admit: 2021-04-11 | Discharge: 2021-04-11 | Disposition: A | Discharge status: Home / Self Care | Payer: BLUE CROSS/BLUE SHIELD | Attending: Emergency Medicine | Admitting: Emergency Medicine

## 2021-04-11 ENCOUNTER — Encounter: Payer: Self-pay | Admitting: Emergency Medicine

## 2021-04-11 DIAGNOSIS — R739 Hyperglycemia, unspecified: Secondary | ICD-10-CM | POA: Insufficient documentation

## 2021-04-11 LAB — CBG MONITORING, ED
Glucose-Capillary: 322 mg/dL — ABNORMAL HIGH (ref 70–99)
Glucose-Capillary: 232 mg/dL — ABNORMAL HIGH (ref 70–99)

## 2021-04-11 LAB — BASIC METABOLIC PANEL
Anion gap: 10 (ref 5–15)
BUN: 8 mg/dL (ref 6–20)
CO2: 23 mmol/L (ref 22–32)
Calcium: 9.3 mg/dL (ref 8.9–10.3)
Chloride: 101 mmol/L (ref 98–111)
Creatinine, Ser: 0.76 mg/dL (ref 0.44–1.00)
GFR, Estimated: >60 mL/min (ref >60)
Glucose, Bld: 314 mg/dL — ABNORMAL HIGH (ref 70–99)
Potassium: 3.7 mmol/L (ref 3.5–5.1)
Sodium: 134 mmol/L — ABNORMAL LOW (ref 135–145)

## 2021-04-11 LAB — URINALYSIS, ROUTINE W REFLEX MICROSCOPIC
Bilirubin Urine: NEGATIVE
Glucose, UA: >=500 mg/dL — AB
Hgb urine dipstick: NEGATIVE
Ketones, ur: NEGATIVE mg/dL
Nitrite: NEGATIVE
Protein, ur: NEGATIVE mg/dL
Specific Gravity, Urine: 1.026 (ref 1.005–1.030)
pH: 6 (ref 5.0–8.0)

## 2021-04-11 LAB — CBC
HCT: 45.2 % (ref 36.0–46.0)
Hemoglobin: 15.1 g/dL — ABNORMAL HIGH (ref 12.0–15.0)
MCH: 31.5 pg (ref 26.0–34.0)
MCHC: 33.4 g/dL (ref 30.0–36.0)
MCV: 94.4 fL (ref 80.0–100.0)
Platelets: 261 10*3/uL (ref 150–400)
RBC: 4.79 MIL/uL (ref 3.87–5.11)
RDW: 12.7 % (ref 11.5–15.5)
WBC: 9.7 10*3/uL (ref 4.0–10.5)
nRBC: 0 % (ref 0.0–0.2)

## 2021-04-11 LAB — POC URINE PREG, ED: Preg Test, Ur: NEGATIVE

## 2021-04-11 MED ORDER — LACTATED RINGERS IV BOLUS
1000.0000 mL | Freq: Once | INTRAVENOUS | Status: AC
  Administered 2021-04-11: 16:00:00 1000 mL via INTRAVENOUS

## 2021-04-11 MED ORDER — NITROFURANTOIN MONOHYD MACRO 100 MG PO CAPS: 100.0000 mg | ORAL_CAPSULE | Freq: Two times a day (BID) | ORAL | 0 refills | Status: AC

## 2021-04-11 MED ORDER — INSULIN ASPART 100 UNIT/ML IJ SOLN: 6.0000 [IU] | Freq: Once | INTRAMUSCULAR | Status: DC

## 2021-04-11 NOTE — Discharge Instructions (Signed)
As we discussed, take the antibiotic ? ?I'd recommend talking to your physician or a PCP at one of the provided numbers about ways to better control your blood sugars ?

## 2021-04-11 NOTE — ED Triage Notes (Signed)
Pt via POV from home. Pt c/o hyperglycemia, pt states she checked it 1 hour PTA and it was in the 400s. Pt has Type II DM. Pt states she just feels "off" and feels weak. Denies any pain. Denies SOB. Pt is A&OX4 and NAD.  ?

## 2021-04-11 NOTE — ED Notes (Signed)
Finishing fluids before giving insulin ? ?

## 2021-04-11 NOTE — ED Provider Notes (Signed)
? ?[facility] ?Provider Note ? ? ? Event Date/Time  ? First MD Initiated Contact with Patient 04/11/21 1417   ?  (approximate) ? ? ?History  ? ?Hyperglycemia ? ? ?HPI ? ?Pamela Rasmussen is a 41 y.o. female   here with hyperglycemia.  The patient states that over the last several weeks, she has had progressively worsening hyperglycemia.  She has been seen once for this.  She is on Ozempic and metformin but not currently on insulin.  She states that she came in because her blood sugars have been consistently over 400s and read as high today.  She states she has been running in the 400s for at least the last several weeks.  She has had increased thirst, no shortness of breath.  No nausea or vomiting.  Denies any fevers or chills.  No other significant complaints.  States that she sees her PCP, but has not seen an endocrinologist.  No other complaints ? ?  ? ? ?Physical Exam  ? ?Triage Vital Signs: ?ED Triage Vitals  ?Enc Vitals Group  ?   BP 04/11/21 1359 (!) 147/89  ?   Pulse Rate 04/11/21 1359 100  ?   Resp 04/11/21 1442 16  ?   Temp --   ?   Temp src --   ?   SpO2 04/11/21 1359 96 %  ?   Weight 04/11/21 1333 251 lb (113.9 kg)  ?   Height 04/11/21 1333 5\' 4"  (1.626 m)  ?   Head Circumference --   ?   Peak Flow --   ?   Pain Score 04/11/21 1333 0  ?   Pain Loc --   ?   Pain Edu? --   ?   Excl. in GC? --   ? ? ?Most recent vital signs: ?Vitals:  ? 04/11/21 1630 04/11/21 1700  ?BP: (!) 156/93 140/75  ?Pulse: 88 92  ?Resp: 16 16  ?SpO2: 99% 97%  ? ? ? ?General: Awake, no distress.  ?CV:  Good peripheral perfusion.  Regular rate and rhythm.  No murmurs or rubs. ?Resp:  Normal effort.  Lungs are clear to auscultation bilaterally ?Abd:  No distention.  No tenderness. ?Other:  No edema. ? ? ?ED Results / Procedures / Treatments  ? ?Labs ?(all labs ordered are listed, but only abnormal results are displayed) ?Labs Reviewed  ?BASIC METABOLIC PANEL - Abnormal; Notable for the following components:   ?    Result Value  ? Sodium 134 (*)   ? Glucose, Bld 314 (*)   ? All other components within normal limits  ?CBC - Abnormal; Notable for the following components:  ? Hemoglobin 15.1 (*)   ? All other components within normal limits  ?URINALYSIS, ROUTINE W REFLEX MICROSCOPIC - Abnormal; Notable for the following components:  ? Color, Urine YELLOW (*)   ? APPearance HAZY (*)   ? Glucose, UA >=500 (*)   ? Leukocytes,Ua MODERATE (*)   ? Bacteria, UA RARE (*)   ? All other components within normal limits  ?CBG MONITORING, ED - Abnormal; Notable for the following components:  ? Glucose-Capillary 322 (*)   ? All other components within normal limits  ?CBG MONITORING, ED - Abnormal; Notable for the following components:  ? Glucose-Capillary 232 (*)   ? All other components within normal limits  ?POC URINE PREG, ED  ? ? ? ?EKG ?Normal sinus rhythm, ventricular at 99.  PR 136, QRS 80, QTc 423.  No acute ST elevations or depressions.  No EKG evidence of acute ischemia or infarct. ? ? ?RADIOLOGY ? ? ? ?I also independently reviewed and agree wit radiologist interpretations. ? ? ?PROCEDURES: ? ?Critical Care performed: No ? ? ? ?MEDICATIONS ORDERED IN ED: ?Medications  ?lactated ringers bolus 1,000 mL (0 mLs Intravenous Stopped 04/11/21 1646)  ? ? ? ?IMPRESSION / MDM / ASSESSMENT AND PLAN / ED COURSE  ?I reviewed the triage vital signs and the nursing notes. ?             ?               ? ?MDM:  ?41 year old female here with ongoing hyperglycemia.  This seems to be a somewhat chronic issue, though she surprisingly is not on insulin.  Today, she has no evidence of DKA on BMP.  Bicarb is 23.  Anion gap is 10.  No leukocytosis.  She does appear mildly dehydrated and UA shows possible UTI, which she has had with hyperglycemia in the past.  Will treat for this.  No flank pain, vomiting, or signs of pyelonephritis clinically.  Vital signs are stable.  Abdomen is soft.  ? ?Pt given fluids with significant improvement in blood glucose.  Suspect pt may ultimately need insulin/additional diabetic management, but no signs of DKA or acute emergency at this time requiring admission. ? ? ?MEDICATIONS GIVEN IN ED: ?Medications  ?lactated ringers bolus 1,000 mL (0 mLs Intravenous Stopped 04/11/21 1646)  ? ? ? ?Consults:  ?None ? ? ?EMR reviewed  ?ED visit for hyperglycemia 2/16 ?Office Visit 02/2021 with Family Medicine for T2DM ? ? ? ? ?FINAL CLINICAL IMPRESSION(S) / ED DIAGNOSES  ? ?Final diagnoses:  ?Hyperglycemia  ? ? ? ?Rx / DC Orders  ? ?ED Discharge Orders   ? ?      Ordered  ?  nitrofurantoin, macrocrystal-monohydrate, (MACROBID) 100 MG capsule  2 times daily       ? 04/11/21 1711  ? ?  ?  ? ?  ? ? ? ?Note:  This document was prepared using Dragon voice recognition software and may include unintentional dictation errors. ?  ?Shaune Pollack, MD ?04/11/21 2254 ? ?

## 2022-03-03 ENCOUNTER — Emergency Department
Admission: EM | Admit: 2022-03-03 | Discharge: 2022-03-03 | Disposition: A | Discharge status: Home / Self Care | Payer: Self-pay | Attending: Emergency Medicine | Admitting: Emergency Medicine

## 2022-03-03 ENCOUNTER — Emergency Department: Payer: Self-pay

## 2022-03-03 DIAGNOSIS — E119 Type 2 diabetes mellitus without complications: Secondary | ICD-10-CM | POA: Insufficient documentation

## 2022-03-03 DIAGNOSIS — Y9389 Activity, other specified: Secondary | ICD-10-CM | POA: Insufficient documentation

## 2022-03-03 DIAGNOSIS — M719 Bursopathy, unspecified: Secondary | ICD-10-CM

## 2022-03-03 DIAGNOSIS — M7071 Other bursitis of hip, right hip: Secondary | ICD-10-CM | POA: Insufficient documentation

## 2022-03-03 MED ORDER — MELOXICAM 15 MG PO TABS: 15.0000 mg | ORAL_TABLET | Freq: Every day | ORAL | 2 refills | Status: AC

## 2022-03-03 NOTE — ED Triage Notes (Signed)
Pt sts that she has been having hip pain. Pt sts that it hurts to lay, sit and when she stands up she needs to wait a few min to get her balance.

## 2022-03-03 NOTE — Discharge Instructions (Signed)
Apply ice to the right hip.  Take meloxicam daily for 1 to 2 weeks.  Do not take this medication longer as you have diabetes.  You may also take Tylenol for pain.  If not improving in 1 week you will need to see orthopedics for cortisone injection.  Be sure to notify them that you also have diabetes

## 2022-03-03 NOTE — ED Provider Notes (Signed)
[facility] Provider Note    Event Date/Time   First MD Initiated Contact with Patient 03/03/22 1133     (approximate)   History   Hip Pain   HPI  Pamela Rasmussen is a 42 y.o. female with history of diabetes, bipolar presents emergency department complaining of right hip pain.  Patient states it hurts to lay on her right side, is painful when she goes from sitting to standing.  No numbness or tingling.  No falls or injuries.  No fever or chills.  No recent infections.      Physical Exam   Triage Vital Signs: ED Triage Vitals  Enc Vitals Group     BP 03/03/22 1051 132/82     Pulse Rate 03/03/22 1051 97     Resp 03/03/22 1051 18     Temp 03/03/22 1051 98.3 F (36.8 C)     Temp Source 03/03/22 1051 Oral     SpO2 03/03/22 1051 97 %     Weight 03/03/22 1052 240 lb (108.9 kg)     Height 03/03/22 1052 5\' 4"  (1.626 m)     Head Circumference --      Peak Flow --      Pain Score 03/03/22 1105 8     Pain Loc --      Pain Edu? --      Excl. in [place]? --     Most recent vital signs: Vitals:   03/03/22 1051  BP: 132/82  Pulse: 97  Resp: 18  Temp: 98.3 F (36.8 C)  SpO2: 97%     General: Awake, no distress.   CV:  Good peripheral perfusion. regular rate and  rhythm Resp:  Normal effort.  Abd:  No distention.   Other:  Right hip is tender along the trochanter bursa, patient is able to stand and walk without difficulty, neurovascular is intact   ED Results / Procedures / Treatments   Labs (all labs ordered are listed, but only abnormal results are displayed) Labs Reviewed - No data to display   EKG     RADIOLOGY     PROCEDURES:   Procedures   MEDICATIONS ORDERED IN ED: Medications - No data to display   IMPRESSION / MDM / [place] / ED COURSE  I reviewed the triage vital signs and the nursing notes.                              Differential diagnosis includes, but is not limited to, acute bursitis,  muscle strain, hip fracture  Patient's presentation is most consistent with acute, uncomplicated illness.   Hip fracture is unlikely as patient has not had a fall or any injury.  She is tender along the trochanter bursa and feel that this is more of a bursitis.  She will be treated with an anti-inflammatory and instructed to take Tylenol along with applying ice.  Follow-up with orthopedics if not improving 1 week.  Did not want to start her on a steroid or do a cortisone injection initially as she has diabetes.  Patient is in agreement treatment plan.  Discharged stable condition.      FINAL CLINICAL IMPRESSION(S) / ED DIAGNOSES   Final diagnoses:  Acute bursitis     Rx / DC Orders   ED Discharge Orders          Ordered    meloxicam (MOBIC) 15 MG tablet  Daily        03/03/22 1210    AMB Referral to Pharmacy Medication Management        Pending             Note:  This document was prepared using Dragon voice recognition software and may include unintentional dictation errors.    Versie Starks, PA-C 03/03/22 1214    Lavonia Drafts, MD 03/03/22 [phone]

## 2022-03-22 ENCOUNTER — Emergency Department
Admission: EM | Admit: 2022-03-22 | Discharge: 2022-03-22 | Disposition: A | Discharge status: Home / Self Care | Payer: No Typology Code available for payment source | Attending: Emergency Medicine | Admitting: Emergency Medicine

## 2022-03-22 ENCOUNTER — Encounter: Payer: Self-pay | Admitting: Emergency Medicine

## 2022-03-22 ENCOUNTER — Emergency Department: Payer: No Typology Code available for payment source

## 2022-03-22 ENCOUNTER — Other Ambulatory Visit: Payer: Self-pay

## 2022-03-22 DIAGNOSIS — E119 Type 2 diabetes mellitus without complications: Secondary | ICD-10-CM | POA: Diagnosis not present

## 2022-03-22 DIAGNOSIS — D72829 Elevated white blood cell count, unspecified: Secondary | ICD-10-CM | POA: Diagnosis not present

## 2022-03-22 DIAGNOSIS — K610 Anal abscess: Secondary | ICD-10-CM | POA: Diagnosis not present

## 2022-03-22 DIAGNOSIS — R109 Unspecified abdominal pain: Secondary | ICD-10-CM | POA: Diagnosis not present

## 2022-03-22 DIAGNOSIS — K760 Fatty (change of) liver, not elsewhere classified: Secondary | ICD-10-CM | POA: Diagnosis not present

## 2022-03-22 LAB — URINALYSIS, ROUTINE W REFLEX MICROSCOPIC
Bilirubin Urine: NEGATIVE
Glucose, UA: 150 mg/dL — AB
Hgb urine dipstick: NEGATIVE
Ketones, ur: NEGATIVE mg/dL
Nitrite: NEGATIVE
Protein, ur: NEGATIVE mg/dL
Specific Gravity, Urine: 1.021 (ref 1.005–1.030)
pH: 6 (ref 5.0–8.0)

## 2022-03-22 LAB — CBC WITH DIFFERENTIAL/PLATELET
Abs Immature Granulocytes: 0.08 10*3/uL — ABNORMAL HIGH (ref 0.00–0.07)
Basophils Absolute: 0.1 10*3/uL (ref 0.0–0.1)
Basophils Relative: 1 %
Eosinophils Absolute: 0.4 10*3/uL (ref 0.0–0.5)
Eosinophils Relative: 3 %
HCT: 42.7 % (ref 36.0–46.0)
Hemoglobin: 14.2 g/dL (ref 12.0–15.0)
Immature Granulocytes: 1 %
Lymphocytes Relative: 20 %
Lymphs Abs: 3.1 10*3/uL (ref 0.7–4.0)
MCH: 31.3 pg (ref 26.0–34.0)
MCHC: 33.3 g/dL (ref 30.0–36.0)
MCV: 94.3 fL (ref 80.0–100.0)
Monocytes Absolute: 1.4 10*3/uL — ABNORMAL HIGH (ref 0.1–1.0)
Monocytes Relative: 9 %
Neutro Abs: 10.3 10*3/uL — ABNORMAL HIGH (ref 1.7–7.7)
Neutrophils Relative %: 66 %
Platelets: 219 10*3/uL (ref 150–400)
RBC: 4.53 MIL/uL (ref 3.87–5.11)
RDW: 12.1 % (ref 11.5–15.5)
WBC: 15.3 10*3/uL — ABNORMAL HIGH (ref 4.0–10.5)
nRBC: 0 % (ref 0.0–0.2)

## 2022-03-22 LAB — BASIC METABOLIC PANEL
Anion gap: 10 (ref 5–15)
BUN: 9 mg/dL (ref 6–20)
CO2: 26 mmol/L (ref 22–32)
Calcium: 8.8 mg/dL — ABNORMAL LOW (ref 8.9–10.3)
Chloride: 94 mmol/L — ABNORMAL LOW (ref 98–111)
Creatinine, Ser: 0.85 mg/dL (ref 0.44–1.00)
GFR, Estimated: >60 mL/min (ref >60)
Glucose, Bld: 189 mg/dL — ABNORMAL HIGH (ref 70–99)
Potassium: 4 mmol/L (ref 3.5–5.1)
Sodium: 130 mmol/L — ABNORMAL LOW (ref 135–145)

## 2022-03-22 LAB — HCG, QUANTITATIVE, PREGNANCY: hCG, Beta Chain, Quant, S: <1 m[IU]/mL (ref <5)

## 2022-03-22 MED ORDER — OXYCODONE-ACETAMINOPHEN 5-325 MG PO TABS: 1.0000 | ORAL_TABLET | Freq: Three times a day (TID) | ORAL | 0 refills | Status: DC | PRN

## 2022-03-22 MED ORDER — METRONIDAZOLE 500 MG PO TABS: 500.0000 mg | ORAL_TABLET | Freq: Three times a day (TID) | ORAL | 0 refills | Status: AC

## 2022-03-22 MED ORDER — SODIUM CHLORIDE 0.9 % IV BOLUS
1000.0000 mL | Freq: Once | INTRAVENOUS | Status: AC
  Administered 2022-03-22: 1000 mL via INTRAVENOUS

## 2022-03-22 MED ORDER — LIDOCAINE-EPINEPHRINE (PF) 2 %-1:200000 IJ SOLN
10.0000 mL | Freq: Once | INTRAMUSCULAR | Status: AC
  Administered 2022-03-22: 10 mL
  Filled 2022-03-22: qty 20

## 2022-03-22 MED ORDER — IOHEXOL 300 MG/ML  SOLN
100.0000 mL | Freq: Once | INTRAMUSCULAR | Status: AC | PRN
  Administered 2022-03-22: 100 mL via INTRAVENOUS

## 2022-03-22 MED ORDER — CIPROFLOXACIN HCL 500 MG PO TABS: 500.0000 mg | ORAL_TABLET | Freq: Two times a day (BID) | ORAL | 0 refills | Status: AC

## 2022-03-22 NOTE — Discharge Instructions (Addendum)
-  You had a perianal abscess which was successfully drained here today.  Please take the full course of the antibiotics as prescribed.  You may continue to wash it in the shower with soap and water daily.  -These abscesses have a tendency to recur.  Please follow-up with the general surgeon listed on this page to schedule appointment for reevaluation.  -You may take oxycodone/acetaminophen as needed for pain.  Be careful though as it may make you dizzy/drowsy and can also be addicting.  -Return to the emergency department anytime if you begin to experience any new or worsening symptoms.

## 2022-03-22 NOTE — ED Triage Notes (Signed)
Pt reports possible boil on her bottom since Thursday.

## 2022-03-22 NOTE — ED Notes (Signed)
Patient declined discharge vital signs. 

## 2022-03-22 NOTE — ED Notes (Signed)
Patient given a warm blanket for comfort. Patient states she has been having loose stools lately and feels that might have contaminated the area.

## 2022-03-22 NOTE — ED Provider Notes (Signed)
[facility] Provider Note    None    (approximate)   History   Chief Complaint Abscess   HPI Pamela Rasmussen is a 42 y.o. female, history of type 2 diabetes (compliant with ozempic), GAD, bipolar disorder, social anxiety, presents to the emergency department for evaluation of suspected abscess.  She states that she first noticed it 2 days ago and has progressively worsened.  Expresses difficulty sitting down due to the pain.  This is never happened to her before.  She states that she has been having episodes of diarrhea as well recently just prior to the onset of the pain and swelling.  Denies fever/chills, allergies, chest pain, shortness of breath, abdominal pain, nausea vomiting, hematochezia, urinary symptoms, rashes, dizziness/lightheadedness, vaginal bleeding, or vaginal discharge.  History Limitations: No limitations.        Physical Exam  Triage Vital Signs: ED Triage Vitals [03/22/22 1430]  Enc Vitals Group     BP (!) 166/98     Pulse Rate (!) 102     Resp 18     Temp 99.1 F (37.3 C)     Temp Source Oral     SpO2 99 %     Weight 238 lb 1.6 oz (108 kg)     Height 5' 4"$  (1.626 m)     Head Circumference      Peak Flow      Pain Score 10     Pain Loc      Pain Edu?      Excl. in [place]?     Most recent vital signs: Vitals:   03/22/22 1430  BP: (!) 166/98  Pulse: (!) 102  Resp: 18  Temp: 99.1 F (37.3 C)  SpO2: 99%    General: Awake, NAD.  Skin: Warm, dry. No rashes or lesions.  Eyes: PERRL. Conjunctivae normal.  CV: Good peripheral perfusion.  Resp: Normal effort.  Abd: Soft, non-tender. No distention.  Neuro: At baseline. No gross neurological deficits.  Musculoskeletal: Normal ROM of all extremities.  Focused Exam: Erythema and swelling along the inferior aspect of the left buttocks with extension towards the rectum.  No obvious anal fissures or hemorrhoids.  No active bleeding or discharge.  Significant mount of  induration.  Point-of-care ultrasound shows significant cobblestoning with scattered areas of what appears to be abscess material.   Physical Exam    ED Results / Procedures / Treatments  Labs (all labs ordered are listed, but only abnormal results are displayed) Labs Reviewed  CBC WITH DIFFERENTIAL/PLATELET - Abnormal; Notable for the following components:      Result Value   WBC 15.3 (*)    Neutro Abs 10.3 (*)    Monocytes Absolute 1.4 (*)    Abs Immature Granulocytes 0.08 (*)    All other components within normal limits  BASIC METABOLIC PANEL - Abnormal; Notable for the following components:   Sodium 130 (*)    Chloride 94 (*)    Glucose, Bld 189 (*)    Calcium 8.8 (*)    All other components within normal limits  URINALYSIS, ROUTINE W REFLEX MICROSCOPIC - Abnormal; Notable for the following components:   Color, Urine YELLOW (*)    APPearance CLOUDY (*)    Glucose, UA 150 (*)    Leukocytes,Ua LARGE (*)    Bacteria, UA RARE (*)    All other components within normal limits  HCG, QUANTITATIVE, PREGNANCY     EKG N/A.    RADIOLOGY  ED Provider Interpretation: I personally reviewed and interpreted the CT scan, findings consistent with early perianal abscess.  CT ABDOMEN PELVIS W CONTRAST  Addendum Date: 03/22/2022   ADDENDUM REPORT: 03/22/2022 17:27 ADDENDUM: Patient returned for additional imaging through the perineum. There is an ill-defined mass along the inferior aspect of the left buttock, below the level of the anus, contiguous with the overlying skin, measuring approximately 3.6 x 3.1 x 3.6 cm. Inflammatory change and stranding extends around the periphery of this lesion and superior to the level of the posterior anus. Findings are consistent with an early perianal abscess. Electronically Signed   By: Lajean Manes M.D.   On: 03/22/2022 17:27   Result Date: 03/22/2022 CLINICAL DATA:  Abdominal pain.  Possible anal rectal abscess. EXAM: CT ABDOMEN AND PELVIS WITH  CONTRAST TECHNIQUE: Multidetector CT imaging of the abdomen and pelvis was performed using the standard protocol following bolus administration of intravenous contrast. RADIATION DOSE REDUCTION: This exam was performed according to the departmental dose-optimization program which includes automated exposure control, adjustment of the mA and/or kV according to patient size and/or use of iterative reconstruction technique. CONTRAST:  135m OMNIPAQUE IOHEXOL 300 MG/ML  SOLN COMPARISON:  06/03/2013. FINDINGS: Lower chest: Lung bases essentially clear. Hepatobiliary: Decreased attenuation of the liver consistent with fatty infiltration. Liver normal in size. No mass. Status post cholecystectomy. No bile duct dilation. Pancreas: Unremarkable. No pancreatic ductal dilatation or surrounding inflammatory changes. Spleen: Normal in size without focal abnormality. Adrenals/Urinary Tract: Adrenal glands are unremarkable. Kidneys are normal, without renal calculi, focal lesion, or hydronephrosis. Bladder is unremarkable. Stomach/Bowel: No perirectal or perianal abscess. Perineum not completely included on the field of view. Normal stomach. Small bowel and colon are normal in caliber. No wall thickening. No inflammation. Normal appendix. Vascular/Lymphatic: No vascular abnormality. Mildly enlarged left inguinal lymph node, 1 point 3 cm in short axis. No other enlarged lymph nodes. Reproductive: Uterus and bilateral adnexa are unremarkable. Other: No abdominal wall hernia or abnormality. No abdominopelvic ascites. Musculoskeletal: No fracture or acute finding. No bone lesion. Disc degenerative changes at L5-S1. IMPRESSION: 1. No acute findings within the abdomen or pelvis. 2. No evidence of perianal or perirectal inflammation or of an abscess. The entire perineum, however, was not fully included on the field of view. 3. Hepatic steatosis. Electronically Signed: By: DLajean ManesM.D. On: 03/22/2022 16:49     PROCEDURES:  Critical Care performed: N/A.  Procedures    MEDICATIONS ORDERED IN ED: Medications  sodium chloride 0.9 % bolus 1,000 mL (0 mLs Intravenous Stopped 03/22/22 1808)  iohexol (OMNIPAQUE) 300 MG/ML solution 100 mL (100 mLs Intravenous Contrast Given 03/22/22 1629)  lidocaine-EPINEPHrine (XYLOCAINE W/EPI) 2 %-1:200000 (PF) injection 10 mL (10 mLs Infiltration Given 03/22/22 1809)     IMPRESSION / MDM / ASSESSMENT AND PLAN / ED COURSE  I reviewed the triage vital signs and the nursing notes.                              Differential diagnosis includes, but is not limited to, perirectal abscess, cellulitis, pilonidal cyst, proctitis.  ED Course Patient appears well, tachycardic here at 102.  Slightly elevated temperature at 99.1.  Hypertensive at 166/98.  CBC shows leukocytosis at 15.3 with left shift.  No anemia present.  BMP shows elevated glucose at 189 with hyponatremia 130, likely secondary to her hyperglycemia.  No other significant electrolyte abnormalities.  No AKI.  Urinalysis shows large leukocytes,  rare bacteria.  In absence of urinary symptoms, unlikely urinary tract infection.  Assessment/Plan Patient presents with pain, redness, and swelling along the left perianal region.  She appears well clinically.  No signs of systemic infection.  Lab workup shows leukocytosis, otherwise no significant abnormalities.  CT scan shows findings consistent with early perianal abscess measuring 3.6 x 3.1 x 3.6 cm.  Was able to successfully drain with single stab incision.  Patient tolerated the procedure well.  Will provide her with a course of antibiotics and analgesics.  Provide her with a referral to general surgery for further evaluation if she continues to have recurrent infections.  Will discharge.  Considered admission for this patient, but given her stable presentation and close access to follow-up, she is unlikely benefit from admission.  Provided the patient with  anticipatory guidance, return precautions, and educational material. Encouraged the patient to return to the emergency department at any time if they begin to experience any new or worsening symptoms. Patient expressed understanding and agreed with the plan.   Patient's presentation is most consistent with acute complicated illness / injury requiring diagnostic workup.       FINAL CLINICAL IMPRESSION(S) / ED DIAGNOSES   Final diagnoses:  Perianal abscess     Rx / DC Orders   ED Discharge Orders          Ordered    ciprofloxacin (CIPRO) 500 MG tablet  2 times daily        03/22/22 1812    metroNIDAZOLE (FLAGYL) 500 MG tablet  3 times daily        03/22/22 1812    oxyCODONE-acetaminophen (PERCOCET/ROXICET) 5-325 MG tablet  Every 8 hours PRN        03/22/22 1813             Note:  This document was prepared using Dragon voice recognition software and may include unintentional dictation errors.   Teodoro Spray, [place] 03/22/22 2229    Harvest Dark, MD 03/22/22 2244

## 2022-03-27 ENCOUNTER — Ambulatory Visit: Payer: No Typology Code available for payment source | Admitting: Surgery

## 2022-03-27 ENCOUNTER — Other Ambulatory Visit: Payer: Self-pay

## 2022-03-27 ENCOUNTER — Encounter: Payer: Self-pay | Admitting: Surgery

## 2022-03-27 VITALS — BP 118/73 | HR 90 | Temp 98.2°F | Ht 64.0 in | Wt 240.0 lb

## 2022-03-27 DIAGNOSIS — K610 Anal abscess: Secondary | ICD-10-CM | POA: Diagnosis not present

## 2022-03-27 DIAGNOSIS — R197 Diarrhea, unspecified: Secondary | ICD-10-CM | POA: Diagnosis not present

## 2022-03-27 HISTORY — DX: Anal abscess: K61.0

## 2022-03-27 NOTE — Patient Instructions (Addendum)
  Referral to [facility]. Please call their office to schedule an appointment.     Advised to pursue a goal of 25 to 30 g of fiber daily.  Made aware that the majority of this may be through natural sources, but advised to be aware of actual consumption and to ensure minimal consumption by daily supplementation.  Various forms of supplements discussed.  Recommended Psyllium husk, that mixes well with applesauce, or the powder which goes down well shaken with chocolate milk.  Strongly advised to consume more fluids to ensure adequate hydration, instructed to watch color of urine to determine adequacy of hydration.  Clarity is pursued in urine output, and bowel activity that correlates to significant meal intake.   We need to avoid deferring having bowel movements, advised to take the time at the first sign of sensation, typically following meals, and in the morning.   Subsequent utilization of MiraLAX may be needed ensure at least daily movement, ideally twice daily bowel movements.  If multiple doses of MiraLAX are necessary utilize them. Never skip a day...  To be regular, we must do the above EVERY day.

## 2022-03-27 NOTE — Progress Notes (Signed)
Patient ID: Pamela Rasmussen, female   DOB: May 11, 1980, 42 y.o.   MRN: [id number]  Chief Complaint: Perianal abscess  History of Present Illness Pamela Rasmussen is a 42 y.o. female with follow-up after ED lancing of a perianal abscess.  Patient had temperatures of 101 on her visit to the ED, which continued post drainage.  However on Cipro and Flagyl they have improved, and she stopped draining.  She reports minimal residual pain.  And she is completing her course of antibiotics. She reports that since her gallbladder was removed, that she has about 10 liquid bowel movements daily.  She takes Imodium at times to help with this.  She reports that she had soiled herself and had to dispose of her underwear and suspected this may have contributed to her abscess.  Past Medical History Past Medical History:  Diagnosis Date   Anxiety    Bipolar 1 disorder ([place])    Vertigo       Past Surgical History:  Procedure Laterality Date   CHOLECYSTECTOMY      Allergies  Allergen Reactions   Levaquin [Levofloxacin In D5w] Itching   Penicillins Rash    Has patient had a PCN reaction causing immediate rash, facial/tongue/throat swelling, SOB or lightheadedness with hypotension: No Has patient had a PCN reaction causing severe rash involving mucus membranes or skin necrosis: No Has patient had a PCN reaction that required hospitalization: No Has patient had a PCN reaction occurring within the last 10 years: No If all of the above answers are "NO", then may proceed with Cephalosporin use.     Current Outpatient Medications  Medication Sig Dispense Refill   ciprofloxacin (CIPRO) 500 MG tablet Take 1 tablet (500 mg total) by mouth 2 (two) times daily for 7 days. 14 tablet 0   divalproex (DEPAKOTE ER) 500 MG 24 hr tablet Take 1 tablet (500 mg total) by mouth at bedtime. 90 tablet 1   hydrOXYzine (VISTARIL) 25 MG capsule Take 1 capsule (25 mg total) by mouth 3 (three) times daily as needed. 90  capsule 1   ketoconazole (NIZORAL) 2 % cream Apply topically.     ketoconazole (NIZORAL) 2 % cream ketoconazole 2 % topical cream  APPLY TOPICALLY TWICE DAILY FOR 10 DAYS. REPEAT FOR ADDITIONAL 10 DAYS AS NEEDED     ketoconazole (NIZORAL) 2 % shampoo Apply 1 application topically 2 (two) times a week.     lithium 300 MG tablet Take 1 tablet (300 mg total) by mouth daily with breakfast. 90 tablet 1   meclizine (ANTIVERT) 25 MG tablet Take 25 mg by mouth 3 (three) times daily as needed.     medroxyPROGESTERone (DEPO-PROVERA) 150 MG/ML injection every 3 (three) months     meloxicam (MOBIC) 15 MG tablet Take 1 tablet (15 mg total) by mouth daily. 30 tablet 2   metroNIDAZOLE (FLAGYL) 500 MG tablet Take 1 tablet (500 mg total) by mouth 3 (three) times daily for 7 days. 21 tablet 0   mupirocin ointment (BACTROBAN) 2 % mupirocin 2 % topical ointment     NEOMYCIN-POLYMYXIN-HYDROCORTISONE (CORTISPORIN) 1 % SOLN OTIC solution INSTILL 3 DROPS INTO RIGHT EAR 4 TIMES DAILY FOR 10 DAYS     OLANZapine zydis (ZYPREXA) 15 MG disintegrating tablet Take 1 tablet by mouth at bedtime.     omeprazole (PRILOSEC) 20 MG capsule Take 1 capsule by mouth daily.     OZEMPIC, 0.25 OR 0.5 MG/DOSE, 2 MG/1.5ML SOPN Inject 0.25 mg into the skin once a week.  0.25 mg x 4 weeks then increase to 0.5 mg weekly     PARoxetine (PAXIL) 40 MG tablet TAKE 1 TABLET BY MOUTH ONCE DAILY IN THE MORNING 90 tablet 0   silver sulfADIAZINE (SILVADENE) 1 % cream Apply topically.     metFORMIN (GLUCOPHAGE) 500 MG tablet Take 1,500 mg by mouth.     No current facility-administered medications for this visit.    Family History Family History  Problem Relation Age of Onset   Bipolar disorder Mother    Down syndrome Brother    Schizophrenia Maternal Aunt    Bipolar disorder Paternal Grandmother    Schizophrenia Paternal Grandmother       Social History Social History   Tobacco Use   Smoking status: Never   Smokeless tobacco: Never   Vaping Use   Vaping Use: Never used  Substance Use Topics   Alcohol use: No   Drug use: No        Review of Systems  Constitutional: Negative.   HENT: Negative.    Eyes: Negative.   Respiratory: Negative.    Cardiovascular: Negative.   Gastrointestinal:  Positive for blood in stool and diarrhea.  Genitourinary: Negative.   Skin: Negative.   Neurological: Negative.   Psychiatric/Behavioral: Negative.       Physical Exam Blood pressure 118/73, pulse 90, temperature 98.2 F (36.8 C), temperature source Oral, height 5' 4"$  (1.626 m), weight 240 lb (108.9 kg), SpO2 97 %. Last Weight  Most recent update: 03/27/2022  9:42 AM    Weight  108.9 kg (240 lb)             CONSTITUTIONAL: Well developed, and nourished, appropriately responsive and aware without distress.   EYES: Sclera non-icteric.   EARS, NOSE, MOUTH AND THROAT:  The oropharynx is clear. Oral mucosa is pink and moist.    Hearing is intact to voice.  NECK: Trachea is midline, and there is no jugular venous distension.  LYMPH NODES:  Lymph nodes in the neck are not appreciated. RESPIRATORY:   Normal respiratory effort without pathologic use of accessory muscles. CARDIOVASCULAR:  Well perfused.  GI: The abdomen is  soft, nontender, and nondistended.  GU: Freda Munro and Fanny Skates present as chaperone's.  To her anterior left there is an indurated area consistent with an empty abscess cavity.  There is no fluctuance there.  There is no remarkable tenderness on palpation there.  The wound has closed and it is not draining.  She tolerates digital rectal exam which is otherwise unremarkable.  No distal rectal pathology appreciated, however there may be a residual hemorrhoidal thrombosed cord anteriorly. MUSCULOSKELETAL:  Symmetrical muscle tone appreciated in all four extremities.    SKIN: Skin turgor is normal. No pathologic skin lesions appreciated.  NEUROLOGIC:  Motor and sensation appear grossly normal.  Cranial nerves are  grossly without defect. PSYCH:  Alert and oriented to person, place and time. Affect is appropriate for situation.  Data Reviewed I have personally reviewed what is currently available of the patient's imaging, recent labs and medical records.   Labs:     Latest Ref Rng & Units 03/22/2022    2:33 PM 04/11/2021    1:35 PM 03/21/2021   10:57 AM  CBC  WBC 4.0 - 10.5 K/uL 15.3  9.7  11.1   Hemoglobin 12.0 - 15.0 g/dL 14.2  15.1  14.4   Hematocrit 36.0 - 46.0 % 42.7  45.2  43.6   Platelets 150 - 400 K/uL 219  261  244       Latest Ref Rng & Units 03/22/2022    2:33 PM 04/11/2021    1:35 PM 03/21/2021   10:57 AM  CMP  Glucose 70 - 99 mg/dL 189  314  513   BUN 6 - 20 mg/dL 9  8  9   $ Creatinine 0.44 - 1.00 mg/dL 0.85  0.76  1.00   Sodium 135 - 145 mmol/L 130  134  133   Potassium 3.5 - 5.1 mmol/L 4.0  3.7  4.3   Chloride 98 - 111 mmol/L 94  101  103   CO2 22 - 32 mmol/L 26  23  19   $ Calcium 8.9 - 10.3 mg/dL 8.8  9.3  8.8   Total Protein 6.5 - 8.1 g/dL   6.7   Total Bilirubin 0.3 - 1.2 mg/dL   0.5   Alkaline Phos 38 - 126 U/L   110   AST 15 - 41 U/L   37   ALT 0 - 44 U/L   35       Imaging: Radiological images reviewed:  ADDENDUM REPORT: 03/22/2022 17:27   ADDENDUM: Patient returned for additional imaging through the perineum. There is an ill-defined mass along the inferior aspect of the left buttock, below the level of the anus, contiguous with the overlying skin, measuring approximately 3.6 x 3.1 x 3.6 cm. Inflammatory change and stranding extends around the periphery of this lesion and superior to the level of the posterior anus. Findings are consistent with an early perianal abscess.     Electronically Signed   By: Lajean Manes M.D.   On: 03/22/2022 17:27    Addended by Lajean Manes, MD on 03/22/2022  5:29 PM    Study Result  Narrative & Impression  CLINICAL DATA:  Abdominal pain.  Possible anal rectal abscess.   EXAM: CT ABDOMEN AND PELVIS WITH CONTRAST    TECHNIQUE: Multidetector CT imaging of the abdomen and pelvis was performed using the standard protocol following bolus administration of intravenous contrast.   RADIATION DOSE REDUCTION: This exam was performed according to the departmental dose-optimization program which includes automated exposure control, adjustment of the mA and/or kV according to patient size and/or use of iterative reconstruction technique.   CONTRAST:  12m OMNIPAQUE IOHEXOL 300 MG/ML  SOLN   COMPARISON:  06/03/2013.   FINDINGS: Lower chest: Lung bases essentially clear.   Hepatobiliary: Decreased attenuation of the liver consistent with fatty infiltration. Liver normal in size. No mass. Status post cholecystectomy. No bile duct dilation.   Pancreas: Unremarkable. No pancreatic ductal dilatation or surrounding inflammatory changes.   Spleen: Normal in size without focal abnormality.   Adrenals/Urinary Tract: Adrenal glands are unremarkable. Kidneys are normal, without renal calculi, focal lesion, or hydronephrosis. Bladder is unremarkable.   Stomach/Bowel: No perirectal or perianal abscess. Perineum not completely included on the field of view.   Normal stomach. Small bowel and colon are normal in caliber. No wall thickening. No inflammation. Normal appendix.   Vascular/Lymphatic: No vascular abnormality.   Mildly enlarged left inguinal lymph node, 1 point 3 cm in short axis. No other enlarged lymph nodes.   Reproductive: Uterus and bilateral adnexa are unremarkable.   Other: No abdominal wall hernia or abnormality. No abdominopelvic ascites.   Musculoskeletal: No fracture or acute finding. No bone lesion. Disc degenerative changes at L5-S1.   IMPRESSION: 1. No acute findings within the abdomen or pelvis. 2. No evidence of perianal or perirectal inflammation or of an abscess.  The entire perineum, however, was not fully included on the field of view. 3. Hepatic steatosis.    Electronically Signed: By: Lajean Manes M.D. On: 03/22/2022 16:49    Within last 24 hrs: No results found.  Assessment    Perianal abscess, status post I&D.  Resolving on residual antibiotics without evidence of recurrence at this time. Patient Active Problem List   Diagnosis Date Noted   Diabetes type 2, uncontrolled 10/01/2020   At risk for prolonged QT interval syndrome 07/11/2020   GAD (generalized anxiety disorder) 11/03/2019   Bipolar disorder, in full remission, most recent episode mixed ([place]) 01/25/2019   Bipolar disorder, in partial remission, most recent episode mixed ([place]) 01/06/2019   Morbid (severe) obesity due to excess calories ([place]) 12/23/2018   High risk medication use 10/26/2018   Bipolar 1 disorder, mixed, moderate ([place]) 10/05/2018   Chronic post-traumatic stress disorder (PTSD) 10/05/2018   Social anxiety disorder 10/05/2018   Bipolar depression ([place]) 05/05/2017   BMI 45.0-49.9, adult ([place]) 05/05/2017    Plan    We discussed having her complete her course of antibiotics, have her follow-up in a month or as needed.  There may be a risk of recurrence requiring additional I&D.  Other options include elective rectal examination under anesthesia for further incision, but at this time none of these are indicated.  Will be glad to see her again, hoping she will not have another recurrence.  Face-to-face time spent with the patient and accompanying care providers(if present) was 40 minutes, with more than 50% of the time spent counseling, educating, and coordinating care of the patient.    These notes generated with voice recognition software. I apologize for typographical errors.  Ronny Bacon M.D., FACS 03/27/2022, 11:55 AM

## 2022-04-22 DIAGNOSIS — F4010 Social phobia, unspecified: Secondary | ICD-10-CM | POA: Diagnosis not present

## 2022-04-22 DIAGNOSIS — Z0001 Encounter for general adult medical examination with abnormal findings: Secondary | ICD-10-CM | POA: Diagnosis not present

## 2022-04-22 DIAGNOSIS — Z23 Encounter for immunization: Secondary | ICD-10-CM | POA: Diagnosis not present

## 2022-04-22 DIAGNOSIS — K219 Gastro-esophageal reflux disease without esophagitis: Secondary | ICD-10-CM | POA: Diagnosis not present

## 2022-04-22 DIAGNOSIS — E1165 Type 2 diabetes mellitus with hyperglycemia: Secondary | ICD-10-CM | POA: Diagnosis not present

## 2022-04-22 DIAGNOSIS — F319 Bipolar disorder, unspecified: Secondary | ICD-10-CM | POA: Diagnosis not present

## 2022-04-29 ENCOUNTER — Ambulatory Visit: Payer: No Typology Code available for payment source | Admitting: Surgery

## 2022-05-13 DIAGNOSIS — E1165 Type 2 diabetes mellitus with hyperglycemia: Secondary | ICD-10-CM | POA: Diagnosis not present

## 2022-05-13 DIAGNOSIS — F319 Bipolar disorder, unspecified: Secondary | ICD-10-CM | POA: Diagnosis not present

## 2022-05-16 DIAGNOSIS — M62838 Other muscle spasm: Secondary | ICD-10-CM | POA: Diagnosis not present

## 2022-05-16 DIAGNOSIS — M25851 Other specified joint disorders, right hip: Secondary | ICD-10-CM | POA: Diagnosis not present

## 2022-07-23 DIAGNOSIS — F319 Bipolar disorder, unspecified: Secondary | ICD-10-CM | POA: Diagnosis not present

## 2022-07-23 DIAGNOSIS — E1165 Type 2 diabetes mellitus with hyperglycemia: Secondary | ICD-10-CM | POA: Diagnosis not present

## 2022-07-23 DIAGNOSIS — E782 Mixed hyperlipidemia: Secondary | ICD-10-CM | POA: Diagnosis not present

## 2022-07-23 DIAGNOSIS — R7989 Other specified abnormal findings of blood chemistry: Secondary | ICD-10-CM | POA: Diagnosis not present

## 2022-07-23 DIAGNOSIS — Z79899 Other long term (current) drug therapy: Secondary | ICD-10-CM | POA: Diagnosis not present

## 2022-07-23 DIAGNOSIS — D72829 Elevated white blood cell count, unspecified: Secondary | ICD-10-CM | POA: Diagnosis not present

## 2022-07-23 DIAGNOSIS — Z5181 Encounter for therapeutic drug level monitoring: Secondary | ICD-10-CM | POA: Diagnosis not present

## 2022-11-30 ENCOUNTER — Emergency Department
Admission: EM | Admit: 2022-11-30 | Discharge: 2022-11-30 | Disposition: A | Discharge status: Home / Self Care | Payer: Self-pay | Attending: Emergency Medicine | Admitting: Emergency Medicine

## 2022-11-30 ENCOUNTER — Other Ambulatory Visit: Payer: Self-pay

## 2022-11-30 DIAGNOSIS — H6011 Cellulitis of right external ear: Secondary | ICD-10-CM | POA: Insufficient documentation

## 2022-11-30 DIAGNOSIS — H66001 Acute suppurative otitis media without spontaneous rupture of ear drum, right ear: Secondary | ICD-10-CM | POA: Insufficient documentation

## 2022-11-30 DIAGNOSIS — L0100 Impetigo, unspecified: Secondary | ICD-10-CM

## 2022-11-30 DIAGNOSIS — E119 Type 2 diabetes mellitus without complications: Secondary | ICD-10-CM | POA: Insufficient documentation

## 2022-11-30 MED ORDER — CEPHALEXIN 500 MG PO CAPS
500.0000 mg | ORAL_CAPSULE | Freq: Once | ORAL | Status: AC
  Administered 2022-11-30: 500 mg via ORAL
  Filled 2022-11-30: qty 1

## 2022-11-30 MED ORDER — MUPIROCIN 2 % EX OINT: TOPICAL_OINTMENT | CUTANEOUS | 0 refills | Status: DC

## 2022-11-30 MED ORDER — AZITHROMYCIN 500 MG PO TABS
500.0000 mg | ORAL_TABLET | Freq: Once | ORAL | Status: AC
  Administered 2022-11-30: 500 mg via ORAL
  Filled 2022-11-30: qty 1

## 2022-11-30 MED ORDER — AZITHROMYCIN 250 MG PO TABS: 250.0000 mg | ORAL_TABLET | Freq: Every day | ORAL | 0 refills | Status: AC

## 2022-11-30 MED ORDER — CEPHALEXIN 500 MG PO CAPS: 500.0000 mg | ORAL_CAPSULE | Freq: Three times a day (TID) | ORAL | 0 refills | Status: AC

## 2022-11-30 NOTE — ED Provider Notes (Signed)
[facility] Emergency Department Provider Note     Event Date/Time   First MD Initiated Contact with Patient 11/30/22 1547     (approximate)   History   Facial Swelling (Right ear X2 days/)   HPI  Pamela Rasmussen is a 42 y.o. female with a history of bipolar disorder, obesity, diabetes, presents to the ED for evaluation of inflammation, swelling, and drainage from her right ear auricle.  She also endorses some right-sided ear pain and fullness.  She denies any recent injury, trauma, or fall patient denies any fevers, chills, or sweats.  Physical Exam   Triage Vital Signs: ED Triage Vitals  Encounter Vitals Group     BP 11/30/22 1523 126/79     Systolic BP Percentile --      Diastolic BP Percentile --      Pulse Rate 11/30/22 1523 88     Resp 11/30/22 1523 17     Temp 11/30/22 1523 98.1 F (36.7 C)     Temp src --      SpO2 11/30/22 1523 98 %     Weight 11/30/22 1523 241 lb (109.3 kg)     Height 11/30/22 1523 5\' 4"  (1.626 m)     Head Circumference --      Peak Flow --      Pain Score 11/30/22 1522 8     Pain Loc --      Pain Education --      Exclude from Growth Chart --     Most recent vital signs: Vitals:   11/30/22 1523  BP: 126/79  Pulse: 88  Resp: 17  Temp: 98.1 F (36.7 C)  SpO2: 98%    General Awake, no distress. NAD HEENT NCAT. PERRL. EOMI. No rhinorrhea. Mucous membranes are moist.  Right ear with obvious erythema and edema noted to the auricle.  There are some honey colored crust noted.  Evaluation of the canal reveals patent canal overall.  The TM on the right is injected, bulging, and notes of purulent effusion.  Palpable right preauricular and posterior left adenopathy noted. CV:  Good peripheral perfusion.  RESP:  Normal effort.  ABD:  No distention.    ED Results / Procedures / Treatments   Labs (all labs ordered are listed, but only abnormal results are displayed) Labs Reviewed - No data to  display   EKG   RADIOLOGY  No results found.   PROCEDURES:  Critical Care performed: No  Procedures   MEDICATIONS ORDERED IN ED: Medications  cephALEXin (KEFLEX) capsule 500 mg (500 mg Oral Given 11/30/22 1618)  azithromycin (ZITHROMAX) tablet 500 mg (500 mg Oral Given 11/30/22 1618)    IMPRESSION / MDM / ASSESSMENT AND PLAN / ED COURSE  I reviewed the triage vital signs and the nursing notes.                              Differential diagnosis includes, but is not limited to, otitis externa, AOM, contact dermatitis, impetigo  Patient's presentation is most consistent with acute, uncomplicated illness.  Patient's diagnosis is consistent with right ear cellulitis and AOM of the right.  With evidence of an acute otitis media as well as some impetigo or cellulitis of the right auricle.  Should be discharged with a prescription for Keflex and azithromycin to treat both skin and ENT infection.  She is also given mupirocin ointment for topical use.  Patient  is to follow up with PCP as discussed, as needed or otherwise directed. Patient is given ED precautions to return to the ED for any worsening or new symptoms.   FINAL CLINICAL IMPRESSION(S) / ED DIAGNOSES   Final diagnoses:  Cellulitis of auricle of right ear  Non-recurrent acute suppurative otitis media of right ear without spontaneous rupture of tympanic membrane  Impetigo     Rx / DC Orders   ED Discharge Orders          Ordered    azithromycin (ZITHROMAX Z-PAK) 250 MG tablet  Daily        11/30/22 1610    cephALEXin (KEFLEX) 500 MG capsule  3 times daily        11/30/22 1610    mupirocin ointment (BACTROBAN) 2 %        11/30/22 1610             Note:  This document was prepared using Dragon voice recognition software and may include unintentional dictation errors.    Lissa Hoard, PA-C 12/01/22 2345    Minna Antis, MD 12/02/22 2307

## 2022-11-30 NOTE — Discharge Instructions (Addendum)
You are being treated for and inner ear infection, ear cellulitis, and an impetigo of the ear. Take the two antibiotics as directed, and use the ointment as prescribed. Follow-up with your primary provider as needed.

## 2022-11-30 NOTE — ED Triage Notes (Signed)
Pt states right ear fullness and swelling. Pt states right sided ear pain as well. Pt coming in for abscess concerned. On exam, entire ear noted to have swelling and dryness. Pt states no known injury or trauma.

## 2023-01-13 ENCOUNTER — Emergency Department
Admission: EM | Admit: 2023-01-13 | Discharge: 2023-01-13 | Disposition: A | Discharge status: Home / Self Care | Payer: Self-pay | Attending: Emergency Medicine | Admitting: Emergency Medicine

## 2023-01-13 ENCOUNTER — Other Ambulatory Visit: Payer: Self-pay

## 2023-01-13 ENCOUNTER — Encounter: Payer: Self-pay | Admitting: Emergency Medicine

## 2023-01-13 DIAGNOSIS — Z7984 Long term (current) use of oral hypoglycemic drugs: Secondary | ICD-10-CM | POA: Insufficient documentation

## 2023-01-13 DIAGNOSIS — H538 Other visual disturbances: Secondary | ICD-10-CM | POA: Insufficient documentation

## 2023-01-13 DIAGNOSIS — E119 Type 2 diabetes mellitus without complications: Secondary | ICD-10-CM | POA: Insufficient documentation

## 2023-01-13 DIAGNOSIS — R42 Dizziness and giddiness: Secondary | ICD-10-CM | POA: Insufficient documentation

## 2023-01-13 DIAGNOSIS — R7309 Other abnormal glucose: Secondary | ICD-10-CM

## 2023-01-13 LAB — CBC
HCT: 42.4 % (ref 36.0–46.0)
Hemoglobin: 14.5 g/dL (ref 12.0–15.0)
MCH: 32.7 pg (ref 26.0–34.0)
MCHC: 34.2 g/dL (ref 30.0–36.0)
MCV: 95.5 fL (ref 80.0–100.0)
Platelets: 262 10*3/uL (ref 150–400)
RBC: 4.44 MIL/uL (ref 3.87–5.11)
RDW: 12.9 % (ref 11.5–15.5)
WBC: 14 10*3/uL — ABNORMAL HIGH (ref 4.0–10.5)
nRBC: 0 % (ref 0.0–0.2)

## 2023-01-13 LAB — BASIC METABOLIC PANEL
Anion gap: 8 (ref 5–15)
BUN: 9 mg/dL (ref 6–20)
CO2: 25 mmol/L (ref 22–32)
Calcium: 9 mg/dL (ref 8.9–10.3)
Chloride: 103 mmol/L (ref 98–111)
Creatinine, Ser: 0.92 mg/dL (ref 0.44–1.00)
GFR, Estimated: >60 mL/min (ref >60)
Glucose, Bld: 188 mg/dL — ABNORMAL HIGH (ref 70–99)
Potassium: 4 mmol/L (ref 3.5–5.1)
Sodium: 136 mmol/L (ref 135–145)

## 2023-01-13 LAB — CBG MONITORING, ED: Glucose-Capillary: 156 mg/dL — ABNORMAL HIGH (ref 70–99)

## 2023-01-13 MED ORDER — METFORMIN HCL 500 MG PO TABS: 500.0000 mg | ORAL_TABLET | Freq: Two times a day (BID) | ORAL | 2 refills | Status: DC

## 2023-01-13 NOTE — ED Triage Notes (Signed)
Patient to ED via POV for blurred vision and dizzines for "a couple days." States she has been off her diabetes meds for a little over a week after losing her insurance.

## 2023-01-13 NOTE — ED Provider Notes (Signed)
[facility] Provider Note    Event Date/Time   First MD Initiated Contact with Patient 01/13/23 1541     (approximate)   History   Blurred Vision   HPI  Pamela Rasmussen is a 42 y.o. female with history of diabetes and obesity presents emergency department with concerns of dizziness and being out of Ozempic.  Patient states her glucose was up around 500 last night.  However today in the ED it has been normal.  States just a little dizzy but no chest pain or shortness of breath.  States she used to take metformin but it upset her stomach.  States they are using the Ozempic to help in glucose control and weight control.      Physical Exam   Triage Vital Signs: ED Triage Vitals  Encounter Vitals Group     BP 01/13/23 1205 118/82     Systolic BP Percentile --      Diastolic BP Percentile --      Pulse Rate 01/13/23 1205 88     Resp 01/13/23 1205 18     Temp 01/13/23 1205 98.3 F (36.8 C)     Temp Source 01/13/23 1205 Oral     SpO2 01/13/23 1205 99 %     Weight 01/13/23 1202 239 lb (108.4 kg)     Height 01/13/23 1202 5\' 4"  (1.626 m)     Head Circumference --      Peak Flow --      Pain Score 01/13/23 1202 0     Pain Loc --      Pain Education --      Exclude from Growth Chart --     Most recent vital signs: Vitals:   01/13/23 1205  BP: 118/82  Pulse: 88  Resp: 18  Temp: 98.3 F (36.8 C)  SpO2: 99%     General: Awake, no distress.   CV:  Good peripheral perfusion. regular rate and  rhythm Resp:  Normal effort.  Abd:  No distention.   Other:      ED Results / Procedures / Treatments   Labs (all labs ordered are listed, but only abnormal results are displayed) Labs Reviewed  BASIC METABOLIC PANEL - Abnormal; Notable for the following components:      Result Value   Glucose, Bld 188 (*)    All other components within normal limits  CBC - Abnormal; Notable for the following components:   WBC 14.0 (*)    All other  components within normal limits  CBG MONITORING, ED - Abnormal; Notable for the following components:   Glucose-Capillary 156 (*)    All other components within normal limits  URINALYSIS, ROUTINE W REFLEX MICROSCOPIC  CBG MONITORING, ED  POC URINE PREG, ED     EKG  EKG   RADIOLOGY     PROCEDURES:   Procedures   MEDICATIONS ORDERED IN ED: Medications - No data to display   IMPRESSION / MDM / ASSESSMENT AND PLAN / ED COURSE  I reviewed the triage vital signs and the nursing notes.                              Differential diagnosis includes, but is not limited to, dizziness secondary to medication reaction, DKA, uncontrolled diabetes, uncontrolled hypertension, CVA  Patient's presentation is most consistent with acute illness / injury with system symptoms.   Patient's labs are reassuring  In conversation  with the patient she is basically here to get a refill on her Ozempic.  Do not feel that she needs to have any further workup for CTs etc.  I did explain to her the cost of Ozempic without insurance is approximately thousand dollars.  She is asking if she can be placed back on metformin at this time since she is in between insurance.  Told her we could give her a prescription for metformin 500 mg twice daily.  She is to call her regular doctor and see if they have any Ozempic samples or if he would like for her to continue that medication.  Explained to her is not something we actually prescribed from the emergency department.  She is in agreement treatment plan.  Discharged stable condition.      FINAL CLINICAL IMPRESSION(S) / ED DIAGNOSES   Final diagnoses:  Dizziness  Elevated glucose     Rx / DC Orders   ED Discharge Orders          Ordered    metFORMIN (GLUCOPHAGE) 500 MG tablet  2 times daily with meals        01/13/23 1612             Note:  This document was prepared using Dragon voice recognition software and may include unintentional  dictation errors.    Faythe Ghee, PA-C 01/13/23 1617    Concha Se, MD 01/13/23 [phone]

## 2023-01-13 NOTE — ED Provider Triage Note (Signed)
Emergency Medicine Provider Triage Evaluation Note  Pamela Rasmussen , a 42 y.o. female  was evaluated in triage.  Pt complains of blurred vision and dizziness for the past few days. Recently stopped taking diabetes medication, ran out of it and doesn't have insurance to get more.   Review of Systems  Positive: Blurred vision, dizziness Negative:   Physical Exam  Ht 5\' 4"  (1.626 m)   Wt 108.4 kg   BMI 41.02 kg/m  Gen:   Awake, no distress   Resp:  Normal effort  MSK:   Moves extremities without difficulty  Other:    Medical Decision Making  Medically screening exam initiated at 12:04 PM.  Appropriate orders placed.  Pamela Rasmussen was informed that the remainder of the evaluation will be completed by another provider, this initial triage assessment does not replace that evaluation, and the importance of remaining in the ED until their evaluation is complete.     Pamela Ali, PA-C 01/13/23 1206

## 2023-02-19 ENCOUNTER — Other Ambulatory Visit: Payer: Self-pay

## 2023-02-19 ENCOUNTER — Encounter: Payer: Self-pay | Admitting: Emergency Medicine

## 2023-02-19 DIAGNOSIS — E1165 Type 2 diabetes mellitus with hyperglycemia: Secondary | ICD-10-CM | POA: Insufficient documentation

## 2023-02-19 LAB — COMPREHENSIVE METABOLIC PANEL
ALT: 47 U/L — ABNORMAL HIGH (ref 0–44)
AST: 45 U/L — ABNORMAL HIGH (ref 15–41)
Albumin: 3.6 g/dL (ref 3.5–5.0)
Alkaline Phosphatase: 134 U/L — ABNORMAL HIGH (ref 38–126)
Anion gap: 15 (ref 5–15)
BUN: 9 mg/dL (ref 6–20)
CO2: 19 mmol/L — ABNORMAL LOW (ref 22–32)
Calcium: 8.8 mg/dL — ABNORMAL LOW (ref 8.9–10.3)
Chloride: 96 mmol/L — ABNORMAL LOW (ref 98–111)
Creatinine, Ser: 1.03 mg/dL — ABNORMAL HIGH (ref 0.44–1.00)
GFR, Estimated: >60 mL/min (ref >60)
Glucose, Bld: 589 mg/dL (ref 70–99)
Potassium: 3.8 mmol/L (ref 3.5–5.1)
Sodium: 130 mmol/L — ABNORMAL LOW (ref 135–145)
Total Bilirubin: 0.4 mg/dL (ref 0.0–1.2)
Total Protein: 7.8 g/dL (ref 6.5–8.1)

## 2023-02-19 LAB — CBC WITH DIFFERENTIAL/PLATELET
Abs Immature Granulocytes: 0.11 10*3/uL — ABNORMAL HIGH (ref 0.00–0.07)
Basophils Absolute: 0.1 10*3/uL (ref 0.0–0.1)
Basophils Relative: 1 %
Eosinophils Absolute: 0.5 10*3/uL (ref 0.0–0.5)
Eosinophils Relative: 4 %
HCT: 43.2 % (ref 36.0–46.0)
Hemoglobin: 14.6 g/dL (ref 12.0–15.0)
Immature Granulocytes: 1 %
Lymphocytes Relative: 27 %
Lymphs Abs: 3.5 10*3/uL (ref 0.7–4.0)
MCH: 33 pg (ref 26.0–34.0)
MCHC: 33.8 g/dL (ref 30.0–36.0)
MCV: 97.7 fL (ref 80.0–100.0)
Monocytes Absolute: 0.8 10*3/uL (ref 0.1–1.0)
Monocytes Relative: 6 %
Neutro Abs: 8 10*3/uL — ABNORMAL HIGH (ref 1.7–7.7)
Neutrophils Relative %: 61 %
Platelets: 252 10*3/uL (ref 150–400)
RBC: 4.42 MIL/uL (ref 3.87–5.11)
RDW: 12.8 % (ref 11.5–15.5)
WBC: 13.1 10*3/uL — ABNORMAL HIGH (ref 4.0–10.5)
nRBC: 0 % (ref 0.0–0.2)

## 2023-02-19 LAB — LIPASE, BLOOD: Lipase: 82 U/L — ABNORMAL HIGH (ref 11–51)

## 2023-02-19 LAB — URINALYSIS, ROUTINE W REFLEX MICROSCOPIC
Bilirubin Urine: NEGATIVE
Glucose, UA: >=500 mg/dL — AB
Ketones, ur: NEGATIVE mg/dL
Leukocytes,Ua: NEGATIVE
Nitrite: NEGATIVE
Protein, ur: NEGATIVE mg/dL
Specific Gravity, Urine: 1.02 (ref 1.005–1.030)
pH: 5 (ref 5.0–8.0)

## 2023-02-19 LAB — LITHIUM LEVEL: Lithium Lvl: 0.12 mmol/L — ABNORMAL LOW (ref 0.60–1.20)

## 2023-02-19 LAB — BETA-HYDROXYBUTYRIC ACID: Beta-Hydroxybutyric Acid: 0.14 mmol/L (ref 0.05–0.27)

## 2023-02-19 LAB — BLOOD GAS, VENOUS
Acid-base deficit: 4.8 mmol/L — ABNORMAL HIGH (ref 0.0–2.0)
Bicarbonate: 20.5 mmol/L (ref 20.0–28.0)
O2 Saturation: 89.1 %
Patient temperature: 37
pCO2, Ven: 38 mm[Hg] — ABNORMAL LOW (ref 44–60)
pH, Ven: 7.34 (ref 7.25–7.43)
pO2, Ven: 58 mm[Hg] — ABNORMAL HIGH (ref 32–45)

## 2023-02-19 LAB — CBG MONITORING, ED: Glucose-Capillary: >600 mg/dL (ref 70–99)

## 2023-02-19 NOTE — ED Provider Triage Note (Signed)
Emergency Medicine Provider Triage Evaluation Note  Pamela Rasmussen , a 43 y.o. female  was evaluated in triage.  Pt complains of high blood sugar, low lithium level at her recent appointment. Reports blurry vision, urinary frequency.  Review of Systems  Positive: Urinary frequency, blurry vision Negative: fever  Physical Exam  There were no vitals taken for this visit. Gen:   Awake, no distress   Resp:  Normal effort  MSK:   Moves extremities without difficulty  Other:    Medical Decision Making  Medically screening exam initiated at 3:48 PM.  Appropriate orders placed.  Pamela Rasmussen was informed that the remainder of the evaluation will be completed by another provider, this initial triage assessment does not replace that evaluation, and the importance of remaining in the ED until their evaluation is complete.     Jackelyn Hoehn, PA-C 02/19/23 1549

## 2023-02-19 NOTE — ED Triage Notes (Signed)
Patient to ED via POV for hyperglycemia. Was seen by PCP and had labs yesterday- states glucose was elevated and lithium was low. Has not taken insulin in over a month due to insurance issues. States she has been sleeping a lot. States been urinating more than normal.   CBG reading HI in triage.

## 2023-02-20 ENCOUNTER — Emergency Department
Admission: EM | Admit: 2023-02-20 | Discharge: 2023-02-20 | Disposition: A | Discharge status: Home / Self Care | Payer: Self-pay | Attending: Emergency Medicine | Admitting: Emergency Medicine

## 2023-02-20 DIAGNOSIS — R739 Hyperglycemia, unspecified: Secondary | ICD-10-CM

## 2023-02-20 LAB — CBG MONITORING, ED: Glucose-Capillary: 294 mg/dL — ABNORMAL HIGH (ref 70–99)

## 2023-02-20 MED ORDER — METFORMIN HCL 500 MG PO TABS: 500.0000 mg | ORAL_TABLET | Freq: Two times a day (BID) | ORAL | 2 refills | Status: AC

## 2023-02-20 NOTE — ED Provider Notes (Signed)
[facility] Provider Note    Event Date/Time   First MD Initiated Contact with Patient 02/20/23 [phone]     (approximate)   History   Hyperglycemia   HPI  Pamela Rasmussen Guardian is a 43 y.o. female   Past medical history of type II diabetic, anxiety and bipolar, PTSD, who presents emergency department with hyperglycemia.  Has been getting high blood sugar readings at home.  Has polyuria and polydipsia.  No other acute medical complaints.  She has not taken her diabetic medications for quite some time after losing insurance.  She had a doctor's appointment just yesterday and was represcribed her Ozempic but was unable to fill it at the pharmacy because her insurance does not cover this costly medication.  External Medical Documents Reviewed: Primary doctor note from 02/18/2023 documenting diabetes and medication management      Physical Exam   Triage Vital Signs: ED Triage Vitals  Encounter Vitals Group     BP 02/19/23 1549 (!) 138/90     Systolic BP Percentile --      Diastolic BP Percentile --      Pulse Rate 02/19/23 1549 (!) 109     Resp 02/19/23 1549 18     Temp 02/19/23 1549 98.3 F (36.8 C)     Temp Source 02/19/23 1549 Oral     SpO2 02/19/23 1549 96 %     Weight 02/19/23 1548 254 lb (115.2 kg)     Height 02/19/23 1548 5\' 4"  (1.626 m)     Head Circumference --      Peak Flow --      Pain Score 02/19/23 1548 0     Pain Loc --      Pain Education --      Exclude from Growth Chart --     Most recent vital signs: Vitals:   02/20/23 0046 02/20/23 0401  BP: 135/87 124/82  Pulse: 89 89  Resp: 18 18  Temp: 98.4 F (36.9 C) 97.8 F (36.6 C)  SpO2: 94% 96%    General: Awake, no distress.  CV:  Good peripheral perfusion.  Resp:  Normal effort.  Abd:  No distention.  Other:  Awake alert well-appearing nontoxic comfortable and pleasant woman in no acute distress.  She has normal vital signs.  She appears euvolemic.     ED Results /  Procedures / Treatments   Labs (all labs ordered are listed, but only abnormal results are displayed) Labs Reviewed  CBC WITH DIFFERENTIAL/PLATELET - Abnormal; Notable for the following components:      Result Value   WBC 13.1 (*)    Neutro Abs 8.0 (*)    Abs Immature Granulocytes 0.11 (*)    All other components within normal limits  COMPREHENSIVE METABOLIC PANEL - Abnormal; Notable for the following components:   Sodium 130 (*)    Chloride 96 (*)    CO2 19 (*)    Glucose, Bld 589 (*)    Creatinine, Ser 1.03 (*)    Calcium 8.8 (*)    AST 45 (*)    ALT 47 (*)    Alkaline Phosphatase 134 (*)    All other components within normal limits  LIPASE, BLOOD - Abnormal; Notable for the following components:   Lipase 82 (*)    All other components within normal limits  LITHIUM LEVEL - Abnormal; Notable for the following components:   Lithium Lvl 0.12 (*)    All other components within normal limits  URINALYSIS, ROUTINE W REFLEX MICROSCOPIC - Abnormal; Notable for the following components:   Color, Urine COLORLESS (*)    APPearance CLEAR (*)    Glucose, UA >=500 (*)    Hgb urine dipstick LARGE (*)    Bacteria, UA RARE (*)    All other components within normal limits  BLOOD GAS, VENOUS - Abnormal; Notable for the following components:   pCO2, Ven 38 (*)    pO2, Ven 58 (*)    Acid-base deficit 4.8 (*)    All other components within normal limits  CBG MONITORING, ED - Abnormal; Notable for the following components:   Glucose-Capillary >600 (*)    All other components within normal limits  CBG MONITORING, ED - Abnormal; Notable for the following components:   Glucose-Capillary 294 (*)    All other components within normal limits  BETA-HYDROXYBUTYRIC ACID     I ordered and reviewed the above labs they are notable for she is hyperglycemia with normal anion gap.  Mild leukocytosis.  No urinary tract infection.  EKG  ED ECG REPORT I, Pilar Jarvis, the attending physician, personally  viewed and interpreted this ECG.   Date: 02/20/2023  EKG Time: 2333  Rate: 75  Rhythm: sinus  Axis: nl  Intervals:none  ST&T Change: no stemi    PROCEDURES:  Critical Care performed: No  Procedures   MEDICATIONS ORDERED IN ED: Medications - No data to display   IMPRESSION / MDM / ASSESSMENT AND PLAN / ED COURSE  I reviewed the triage vital signs and the nursing notes.                                Patient's presentation is most consistent with acute presentation with potential threat to life or bodily function.  Differential diagnosis includes, but is not limited to, hyperglycemia, HHS, DKA, urinary tract infection, dehydration   The patient is on the cardiac monitor to evaluate for evidence of arrhythmia and/or significant heart rate changes.  MDM:    This is a patient with poorly controlled diabetes in the setting of inability to fill her diabetic medication.  Now she has insurance but insurance will not pay for her previously prescribed Ozempic.  She had been on metformin in the past and needs a refill for this.  I gave her a refill.  I considered DKA given her significantly elevated glucose but no evidence on that lab analysis.  Does not appear to be in HHS.  Urinary tract infection unlikely given no signs of bacteria or inflammatory changes and with polyuria only in the setting of high glucose, I doubt urine infection.  Medication management as above and she will follow-up with PMD for ongoing diabetes management.      FINAL CLINICAL IMPRESSION(S) / ED DIAGNOSES   Final diagnoses:  Hyperglycemia     Rx / DC Orders   ED Discharge Orders          Ordered    metFORMIN (GLUCOPHAGE) 500 MG tablet  2 times daily with meals        02/20/23 0410             Note:  This document was prepared using Dragon voice recognition software and may include unintentional dictation errors.    Pilar Jarvis, MD 02/20/23 [phone]

## 2023-02-20 NOTE — Discharge Instructions (Addendum)
Take metformin as prescribed.  Thank you for choosing [place] for your health care today!  Please see your primary doctor this week for a follow up appointment.   If you have any new, worsening, or unexpected symptoms call your doctor right away or come back to the emergency department for reevaluation.  It was my pleasure to care for you today.   Daneil Dan [place] Charon, MD

## 2023-02-20 NOTE — ED Notes (Signed)
Patient discharged via [place] Charon, MD. Patient stable and ambulatory with steady even gait on dispo.

## 2023-03-02 ENCOUNTER — Other Ambulatory Visit: Payer: Self-pay

## 2023-03-02 ENCOUNTER — Emergency Department
Admission: EM | Admit: 2023-03-02 | Discharge: 2023-03-02 | Disposition: A | Discharge status: Home / Self Care | Payer: Self-pay | Attending: Emergency Medicine | Admitting: Emergency Medicine

## 2023-03-02 DIAGNOSIS — N3000 Acute cystitis without hematuria: Secondary | ICD-10-CM | POA: Insufficient documentation

## 2023-03-02 DIAGNOSIS — E119 Type 2 diabetes mellitus without complications: Secondary | ICD-10-CM | POA: Insufficient documentation

## 2023-03-02 DIAGNOSIS — R81 Glycosuria: Secondary | ICD-10-CM | POA: Insufficient documentation

## 2023-03-02 LAB — URINALYSIS, ROUTINE W REFLEX MICROSCOPIC
Bacteria, UA: NONE SEEN
Bilirubin Urine: NEGATIVE
Glucose, UA: >=500 mg/dL — AB
Hgb urine dipstick: NEGATIVE
Ketones, ur: NEGATIVE mg/dL
Nitrite: NEGATIVE
Protein, ur: NEGATIVE mg/dL
Specific Gravity, Urine: 1.024 (ref 1.005–1.030)
pH: 6 (ref 5.0–8.0)

## 2023-03-02 MED ORDER — CEPHALEXIN 500 MG PO CAPS: 500.0000 mg | ORAL_CAPSULE | Freq: Three times a day (TID) | ORAL | 0 refills | Status: AC

## 2023-03-02 MED ORDER — FLUCONAZOLE 150 MG PO TABS: 150.0000 mg | ORAL_TABLET | Freq: Every day | ORAL | 0 refills | Status: DC

## 2023-03-02 NOTE — ED Triage Notes (Signed)
Pt sts that she thinks that she has a UTI as she is having vaginal itching and burning. Pt sts that she took the OTC meds for a yeast infection and that did not help.

## 2023-03-02 NOTE — ED Provider Notes (Signed)
[facility] Provider Note    Event Date/Time   First MD Initiated Contact with Patient 03/02/23 1557     (approximate)   History   UTI   HPI  Pamela Rasmussen is a 43 y.o. female with history of type 2 diabetes, bipolar disorder and as listed in EMR presents to the emergency department for treatment and evaluation of urinary frequency, vaginal itching and burning.  No relief with over-the-counter medications.  No fever.  No back or abdominal pain.Marland Kitchen      Physical Exam   Triage Vital Signs: ED Triage Vitals [03/02/23 1151]  Encounter Vitals Group     BP 136/76     Systolic BP Percentile      Diastolic BP Percentile      Pulse Rate 97     Resp 17     Temp 98.6 F (37 C)     Temp Source Oral     SpO2 94 %     Weight 254 lb (115.2 kg)     Height 5\' 4"  (1.626 m)     Head Circumference      Peak Flow      Pain Score 6     Pain Loc      Pain Education      Exclude from Growth Chart     Most recent vital signs: Vitals:   03/02/23 1151  BP: 136/76  Pulse: 97  Resp: 17  Temp: 98.6 F (37 C)  SpO2: 94%    General: Awake, no distress.  CV:  Good peripheral perfusion.  Resp:  Normal effort.  Abd:  No distention.  Other:     ED Results / Procedures / Treatments   Labs (all labs ordered are listed, but only abnormal results are displayed) Labs Reviewed  URINALYSIS, ROUTINE W REFLEX MICROSCOPIC - Abnormal; Notable for the following components:      Result Value   Color, Urine STRAW (*)    APPearance CLOUDY (*)    Glucose, UA >=500 (*)    Leukocytes,Ua SMALL (*)    All other components within normal limits     EKG  Not indicated   RADIOLOGY  Image and radiology report reviewed and interpreted by me. Radiology report consistent with the same.  Not indicated  PROCEDURES:  Critical Care performed: No  Procedures   MEDICATIONS ORDERED IN ED:  Medications - No data to display   IMPRESSION / MDM / ASSESSMENT  AND PLAN / ED COURSE   I have reviewed the triage note.  Differential diagnosis includes, but is not limited to, acute cystitis, Candida infection, bacterial vaginosis  Patient's presentation is most consistent with acute complicated illness / injury requiring diagnostic workup.  43 year old female presenting to the emergency department for treatment and evaluation of urinary symptoms as described in HPI.  Physical exam is unremarkable.  Urinalysis is consistent with acute cystitis.  She also has quite a bit of glucose in the urine specimen.  She states that her glucose has been elevated for quite some time. She denies symptoms. She was strongly advised to see her primary care provider to better manage her diabetes and ER return precautions were discussed.       FINAL CLINICAL IMPRESSION(S) / ED DIAGNOSES   Final diagnoses:  Acute cystitis without hematuria  Glucosuria     Rx / DC Orders   ED Discharge Orders          Ordered    cephALEXin (KEFLEX)  500 MG capsule  3 times daily        03/02/23 1604    fluconazole (DIFLUCAN) 150 MG tablet  Daily        03/02/23 1604             Note:  This document was prepared using Dragon voice recognition software and may include unintentional dictation errors.   Chinita Pester, FNP 03/02/23 2229    Jene Every, MD 03/02/23 2248

## 2023-03-17 ENCOUNTER — Ambulatory Visit: Payer: Self-pay

## 2023-03-17 ENCOUNTER — Encounter: Payer: Self-pay | Admitting: Nurse Practitioner

## 2023-03-17 ENCOUNTER — Other Ambulatory Visit: Payer: Self-pay | Admitting: Nurse Practitioner

## 2023-03-17 DIAGNOSIS — B3731 Acute candidiasis of vulva and vagina: Secondary | ICD-10-CM

## 2023-03-17 DIAGNOSIS — Z113 Encounter for screening for infections with a predominantly sexual mode of transmission: Secondary | ICD-10-CM | POA: Diagnosis not present

## 2023-03-17 LAB — WET PREP FOR TRICH, YEAST, CLUE: Trichomonas Exam: NEGATIVE

## 2023-03-17 LAB — HM HIV SCREENING LAB: HM HIV Screening: NEGATIVE

## 2023-03-17 MED ORDER — CLOTRIMAZOLE 1 % EX LOTN: TOPICAL_LOTION | CUTANEOUS | 0 refills | Status: DC

## 2023-03-17 MED ORDER — FLUCONAZOLE 150 MG PO TABS: 150.0000 mg | ORAL_TABLET | ORAL | 0 refills | Status: AC

## 2023-03-17 NOTE — Progress Notes (Signed)
[facility] Department STI clinic [address] [place] [address] Main phone: [phone]  STI screening visit  Subjective:  Pamela Rasmussen is a 43 y.o. female being seen today for an STI screening visit. The patient reports they {Actions; do/do not:19616} have symptoms.  Patient reports that they {Actions; do/do not:19616} desire a pregnancy in the next year.   They reported they {Actions; are/are not:16769} interested in discussing contraception today.    Patient's last menstrual period was 02/18/2023 (approximate).  Patient has the following medical conditions:  Patient Active Problem List   Diagnosis Date Noted   Diarrhea 03/27/2022   Perianal abscess 03/27/2022   Diabetes type 2, uncontrolled 10/01/2020   At risk for prolonged QT interval syndrome 07/11/2020   GAD (generalized anxiety disorder) 11/03/2019   Bipolar disorder, in full remission, most recent episode mixed (HCC) 01/25/2019   Bipolar disorder, in partial remission, most recent episode mixed (HCC) 01/06/2019   Morbid (severe) obesity due to excess calories (HCC) 12/23/2018   High risk medication use 10/26/2018   Bipolar 1 disorder, mixed, moderate (HCC) 10/05/2018   Chronic post-traumatic stress disorder (PTSD) 10/05/2018   Social anxiety disorder 10/05/2018   Bipolar depression (HCC) 05/05/2017   BMI 45.0-49.9, adult (HCC) 05/05/2017    Chief Complaint  Patient presents with   SEXUALLY TRANSMITTED DISEASE    STI screening-vaginal itching, dryness, pain with sex-was seen at ER recently and treated for UTI-no other testing done-has hx of herpes but feels differently    HPI Patient is a 43 y.o. female who presents to the clinic today with concern of itching, burning,    Patient reports ***  Does the patient using douching products? {yes/no:20286}  Last HIV test per patient/review of record was No results found for: "HMHIVSCREEN" No results found for: "HIV"    Last HEPC test per patient/review of record was No results found for: "HMHEPCSCREEN" No components found for: "HEPC"   Last HEPB test per patient/review of record was No components found for: "HMHEPBSCREEN"   Patient reports last pap was: ***  No results found for: "DIAGPAP", "HPVHIGH", "ADEQPAP" No results found for: "SPECADGYN" No Cervical Cancer Screening results to display.  Screening for MPX risk: Does the patient have an unexplained rash? {yes/no:20286} Is the patient MSM? {yes/no:20286} Does the patient endorse multiple sex partners or anonymous sex partners? {yes/no:20286} Did the patient have close or sexual contact with a person diagnosed with MPX? {yes/no:20286} Has the patient traveled outside the [place] where MPX is endemic? {yes/no:20286} Is there a high clinical suspicion for MPX-- evidenced by one of the following {yes/no:20286}  -Unlikely to be chickenpox  -Lymphadenopathy  -Rash that present in same phase of evolution on any given body part See flowsheet for further details and programmatic requirements.   Immunization history:  Immunization History  Administered Date(s) Administered   Dtap, Unspecified 05/15/1981, 07/18/1981, 09/19/1981, 10/08/1982, 04/10/1986   Hepatitis A, Adult 01/11/2009   Influenza, Quadrivalent, Recombinant, Inj, Pf 11/23/2018   Influenza,inj,Quad PF,6+ Mos 04/11/2016, 11/10/2017   Influenza,inj,quad, With Preservative 11/04/2019   Influenza-Unspecified 04/17/2017, 11/18/2019, 11/03/2020   MMR 08/29/1982, 07/26/2015   PFIZER(Purple Top)SARS-COV-2 Vaccination 05/15/2019, 06/05/2019, 01/13/2020, 02/04/2020   PPD Test 03/28/2014, 08/23/2020, 12/06/2021   Pneumococcal Polysaccharide-23 05/29/2020   Polio, Unspecified 05/15/1981, 07/18/1981, 10/08/1982, 04/10/1986   Td 11/03/2000   Tdap 09/25/2010, 10/03/2020     The following portions of the patient's history were reviewed and updated as appropriate: allergies, current medications, past  medical history, past social  history, past surgical history and problem list.  Objective:  There were no vitals filed for this visit.  Physical Exam  Assessment and Plan:  Pamela Rasmussen is a 43 y.o. female presenting to the [facility] Department for STI screening  1. Screening for venereal disease (Primary) *** - HIV [place] LAB - Syphilis Serology, [place] [facility] - Chlamydia/Gonorrhea  Lab - WET PREP FOR TRICH, YEAST, CLUE - Gonococcus culture - Gonococcus culture   Patient accepted all screenings including ***oral, vaginal CT/GC and bloodwork for HIV/RPR, and wet prep. Patient meets criteria for HepB screening? {yes/no:20286}. Ordered? {Response; yes/no/na:63} Patient meets criteria for HepC screening? {yes/no:20286}. Ordered? {Response; yes/no/na:63}  Treat wet prep per standing order Discussed time line for [facility] results and that patient will be called with positive results and encouraged patient to call if she had not heard in 2 weeks.  Counseled to return or seek care for continued or worsening symptoms Recommended repeat testing in 3 months with positive results. Recommended condom use with all sex for STI prevention.   Patient is currently using {CCO Contraception:21020264} to prevent pregnancy.    Return if symptoms worsen or fail to improve.  No future appointments.  Edmonia James, NP

## 2023-03-21 LAB — GONOCOCCUS CULTURE

## 2023-04-20 NOTE — Progress Notes (Signed)
 Pt here for STI screening.  Does have symptoms.  Wet mount results reviewed with patient.  Condoms declined.-Collins Scotland, RN

## 2023-04-30 ENCOUNTER — Encounter: Payer: Self-pay | Admitting: Nurse Practitioner

## 2023-04-30 ENCOUNTER — Ambulatory Visit

## 2023-04-30 DIAGNOSIS — B3731 Acute candidiasis of vulva and vagina: Secondary | ICD-10-CM

## 2023-04-30 DIAGNOSIS — Z113 Encounter for screening for infections with a predominantly sexual mode of transmission: Secondary | ICD-10-CM

## 2023-04-30 DIAGNOSIS — B009 Herpesviral infection, unspecified: Secondary | ICD-10-CM

## 2023-04-30 LAB — WET PREP FOR TRICH, YEAST, CLUE: Trichomonas Exam: NEGATIVE

## 2023-04-30 MED ORDER — VALACYCLOVIR HCL 500 MG PO TABS: 500.0000 mg | ORAL_TABLET | Freq: Every day | ORAL | 12 refills | Status: AC

## 2023-04-30 MED ORDER — FLUCONAZOLE 150 MG PO TABS: 150.0000 mg | ORAL_TABLET | ORAL | 0 refills | Status: AC

## 2023-04-30 MED ORDER — CLOTRIMAZOLE 1 % VA CREA: 1.0000 | TOPICAL_CREAM | Freq: Every day | VAGINAL | Status: AC

## 2023-04-30 MED ORDER — CLOTRIMAZOLE 1 % VA CREA: 1.0000 | TOPICAL_CREAM | Freq: Every day | VAGINAL | Status: DC

## 2023-04-30 NOTE — Progress Notes (Signed)
 Pt is here for STD screening. The patient was dispensed Valtrex 500 mg daily and clotrimazole 1% vaginal cream for 7 days. I provided counseling today regarding the medication. We discussed the medication, the side effects and when to call clinic. Patient given the opportunity to ask questions for any clarification. Condoms declined. Sonda Primes, RN.

## 2023-05-06 LAB — GONOCOCCUS CULTURE

## 2023-05-06 NOTE — Progress Notes (Signed)
 [facility] Department STI clinic [address], [address] [place] [address] Main phone: [phone]  STI screening visit  Subjective:  Pamela Rasmussen is a 43 y.o. female being seen today for an STI screening visit. The patient reports they do have symptoms.  Patient reports that they do not desire a pregnancy in the next year.   They reported they are not interested in discussing contraception today.    Patient's last menstrual period was 04/08/2023 (approximate).  Patient has the following medical conditions:  Patient Active Problem List   Diagnosis Date Noted   Diarrhea 03/27/2022   Perianal abscess 03/27/2022   Diabetes type 2, uncontrolled 10/01/2020   At risk for prolonged QT interval syndrome 07/11/2020   GAD (generalized anxiety disorder) 11/03/2019   Bipolar disorder, in full remission, most recent episode mixed (HCC) 01/25/2019   Bipolar disorder, in partial remission, most recent episode mixed (HCC) 01/06/2019   Morbid (severe) obesity due to excess calories (HCC) 12/23/2018   High risk medication use 10/26/2018   Bipolar 1 disorder, mixed, moderate (HCC) 10/05/2018   Chronic post-traumatic stress disorder (PTSD) 10/05/2018   Social anxiety disorder 10/05/2018   Bipolar depression (HCC) 05/05/2017   BMI 45.0-49.9, adult (HCC) 05/05/2017    Chief Complaint  Patient presents with   SEXUALLY TRANSMITTED DISEASE    Itching x 1 week   Patient is a pleasant 43 y.o. female who presents to the office today requesting symptomatic STI testing.  Patient reports symptoms began 1 week ago and include vaginal itching, vaginal discharge (white, chunky, mild odor), and pain with sex due to skin irritation. She reports not trying any OTC symptom relief products. Of note, patient seen at this clinic about 1 month ago with similar symptoms.  Patient indicates 1 female/female partners in the last 2 months. She reports practicing vaginal, oral, and anal  sex and uses condoms sometimes. Patient indicates a history of HSV, HPV, gonorrhea, chlamydia, and trich. Patient reports last sex was about 8 days ago. She indicates no use of a contraception method.  Patient indicates LMP was approximately 04/08/23.    Last HIV test per patient/review of record was  Lab Results  Component Value Date   HMHIVSCREEN Negative - Validated 03/17/2023   No results found for: "HIV"   Last HEPC test per patient/review of record was No results found for: "HMHEPCSCREEN" No components found for: "HEPC"   Last HEPB test per patient/review of record was No components found for: "HMHEPBSCREEN"   Patient reports last pap was:  No results found for: "DIAGPAP", "HPVHIGH", "ADEQPAP" No results found for: "SPECADGYN" No Cervical Cancer Screening results to display.  Screening for MPX risk: Does the patient have an unexplained rash? No Is the patient MSM? No Does the patient endorse multiple sex partners or anonymous sex partners? No Did the patient have close or sexual contact with a person diagnosed with MPX? No Has the patient traveled outside the [place] where MPX is endemic? No Is there a high clinical suspicion for MPX-- evidenced by one of the following No  -Unlikely to be chickenpox  -Lymphadenopathy  -Rash that present in same phase of evolution on any given body part See flowsheet for further details and programmatic requirements.   Immunization history:  Immunization History  Administered Date(s) Administered   Dtap, Unspecified 05/15/1981, 07/18/1981, 09/19/1981, 10/08/1982, 04/10/1986   Hepatitis A, Adult 01/11/2009   Influenza, Quadrivalent, Recombinant, Inj, Pf 11/23/2018   Influenza,inj,Quad PF,6+ Mos 04/11/2016, 11/10/2017  Influenza,inj,quad, With Preservative 11/04/2019   Influenza-Unspecified 04/17/2017, 11/18/2019, 11/03/2020   MMR 08/29/1982, 07/26/2015   PFIZER(Purple Top)SARS-COV-2 Vaccination 05/15/2019, 06/05/2019, 01/13/2020, 02/04/2020    PPD Test 03/28/2014, 08/23/2020, 12/06/2021   Pneumococcal Polysaccharide-23 05/29/2020   Polio, Unspecified 05/15/1981, 07/18/1981, 10/08/1982, 04/10/1986   Td 11/03/2000   Tdap 09/25/2010, 10/03/2020     The following portions of the patient's history were reviewed and updated as appropriate: allergies, current medications, past medical history, past social history, past surgical history and problem list.  Objective:  There were no vitals filed for this visit.  Physical Exam Nursing note reviewed.  Constitutional:      Appearance: Normal appearance.  HENT:     Head: Normocephalic.     Salivary Glands: Right salivary gland is not diffusely enlarged or tender. Left salivary gland is not diffusely enlarged or tender.     Mouth/Throat:     Lips: Pink. No lesions.     Mouth: Mucous membranes are moist.     Tongue: No lesions. Tongue does not deviate from midline.     Pharynx: Oropharynx is clear. Uvula midline.     Tonsils: No tonsillar exudate.  Eyes:     General:        Right eye: No discharge.        Left eye: No discharge.  Pulmonary:     Effort: Pulmonary effort is normal.  Genitourinary:    Exam position: Lithotomy position.     Pubic Area: No rash or pubic lice.      Tanner stage (genital): 5.     Labia:        Right: Tenderness present. No rash, lesion or injury.        Left: Tenderness present. No rash, lesion or injury.      Vagina: Vaginal discharge, erythema and tenderness present. No bleeding or lesions.     Cervix: Normal. No cervical motion tenderness, discharge, friability, lesion, erythema, cervical bleeding or eversion.     Uterus: Normal.      Adnexa: Right adnexa normal and left adnexa normal.       Comments: pH<4.5 Labia erythematous with mild edema. Evidence of vaginal discharge present to external vagina highly suspicious of yeast due to appearance. Area depicted in diagram representative of erythema, edema, and discharge present.  Lymphadenopathy:      Head:     Right side of head: No submental, submandibular, tonsillar, preauricular or posterior auricular adenopathy.     Left side of head: No submental, submandibular, tonsillar, preauricular or posterior auricular adenopathy.     Cervical: No cervical adenopathy.     Right cervical: No superficial or posterior cervical adenopathy.    Left cervical: No superficial or posterior cervical adenopathy.     Upper Body:     Right upper body: No supraclavicular or axillary adenopathy.     Left upper body: No supraclavicular or axillary adenopathy.     Lower Body: No right inguinal adenopathy. No left inguinal adenopathy.  Skin:    General: Skin is warm and dry.     Findings: No rash.     Comments: Skin tone appropriate for ethnicity.   Neurological:     Mental Status: She is alert and oriented to person, place, and time.  Psychiatric:        Attention and Perception: Attention and perception normal.        Mood and Affect: Mood and affect normal.        Speech: Speech normal.  Behavior: Behavior normal. Behavior is cooperative.        Thought Content: Thought content normal.     Assessment and Plan:  Pamela Rasmussen is a 43 y.o. female presenting to the [facility] Department for STI screening  1. Screening for venereal disease (Primary) Wet prep positive for yeast. - Chlamydia/Gonorrhea [facility] - Gonococcus culture - WET PREP FOR TRICH, YEAST, CLUE - Gonococcus culture  2. HSV-2 infection  - valACYclovir (VALTREX) 500 MG tablet; Take 1 tablet (500 mg total) by mouth daily.  Dispense: 30 tablet; Refill: 12  3. Vulvovaginal candidiasis Due to patient reported symptoms, positive yeast on wet prep, and patient with uncontrolled diabetes likely contributing to disease process, will treat with vaginal cream and oral Fluconazole as indicated by UpToDate guidelines for complicated vulvovaginal candidiasis.   Patient advised to establish care with a PCP for  evaluation and management of diabetes. Explained her diabetes is complicating the vaginal symptoms and making it difficult to resolve and prevent vuvlovaginal candidiasis. Patient verbalized understanding and agreed with plan.   - clotrimazole (GYNE-LOTRIMIN) 1 % vaginal cream; Place 1 Applicatorful vaginally at bedtime for 7 days. - fluconazole (DIFLUCAN) 150 MG tablet; Take 1 tablet (150 mg total) by mouth every 3 (three) days for 2 doses.  Dispense: 2 tablet; Refill: 0  Patient accepted all screenings including oral, vaginal CT/GC and bloodwork for HIV/RPR, and wet prep. Patient meets criteria for HepB screening? No. Ordered? N/A  Patient meets criteria for HepC screening? No. Ordered? N/A  Treat wet prep per standing order Discussed time line for [facility] results and that patient will be called with positive results and encouraged patient to call if she had not heard in 2 weeks.  Counseled to return or seek care for continued or worsening symptoms Recommended repeat testing in 3 months with positive results. Recommended condom use with all sex for STI prevention.   Patient is currently using  nothing  to prevent pregnancy.    Return if symptoms worsen or fail to improve.  No future appointments.  Total time with patient 30 minutes.   Edmonia James, NP

## 2023-05-28 ENCOUNTER — Emergency Department
Admission: EM | Admit: 2023-05-28 | Discharge: 2023-05-28 | Discharge status: Against Medical Advice | Payer: MEDICAID | Attending: Emergency Medicine | Admitting: Emergency Medicine

## 2023-05-28 ENCOUNTER — Other Ambulatory Visit: Payer: Self-pay

## 2023-05-28 DIAGNOSIS — R509 Fever, unspecified: Secondary | ICD-10-CM | POA: Diagnosis not present

## 2023-05-28 DIAGNOSIS — L02212 Cutaneous abscess of back [any part, except buttock]: Secondary | ICD-10-CM | POA: Diagnosis present

## 2023-05-28 DIAGNOSIS — Z5321 Procedure and treatment not carried out due to patient leaving prior to being seen by health care provider: Secondary | ICD-10-CM | POA: Diagnosis not present

## 2023-05-28 DIAGNOSIS — R739 Hyperglycemia, unspecified: Secondary | ICD-10-CM | POA: Diagnosis not present

## 2023-05-28 LAB — BASIC METABOLIC PANEL WITH GFR
Anion gap: 13 (ref 5–15)
BUN: 7 mg/dL (ref 6–20)
CO2: 21 mmol/L — ABNORMAL LOW (ref 22–32)
Calcium: 9.2 mg/dL (ref 8.9–10.3)
Chloride: 93 mmol/L — ABNORMAL LOW (ref 98–111)
Creatinine, Ser: 0.96 mg/dL (ref 0.44–1.00)
GFR, Estimated: >60 mL/min (ref >60)
Glucose, Bld: 392 mg/dL — ABNORMAL HIGH (ref 70–99)
Potassium: 3.8 mmol/L (ref 3.5–5.1)
Sodium: 127 mmol/L — ABNORMAL LOW (ref 135–145)

## 2023-05-28 LAB — URINALYSIS, ROUTINE W REFLEX MICROSCOPIC
Bilirubin Urine: NEGATIVE
Glucose, UA: >=500 mg/dL — AB
Hgb urine dipstick: NEGATIVE
Ketones, ur: NEGATIVE mg/dL
Nitrite: NEGATIVE
Protein, ur: NEGATIVE mg/dL
Specific Gravity, Urine: 1.026 (ref 1.005–1.030)
pH: 6 (ref 5.0–8.0)

## 2023-05-28 LAB — CBC
HCT: 40.9 % (ref 36.0–46.0)
Hemoglobin: 14.4 g/dL (ref 12.0–15.0)
MCH: 32.4 pg (ref 26.0–34.0)
MCHC: 35.2 g/dL (ref 30.0–36.0)
MCV: 91.9 fL (ref 80.0–100.0)
Platelets: 220 10*3/uL (ref 150–400)
RBC: 4.45 MIL/uL (ref 3.87–5.11)
RDW: 12.2 % (ref 11.5–15.5)
WBC: 15 10*3/uL — ABNORMAL HIGH (ref 4.0–10.5)
nRBC: 0 % (ref 0.0–0.2)

## 2023-05-28 LAB — POC URINE PREG, ED: Preg Test, Ur: NEGATIVE

## 2023-05-28 LAB — CBG MONITORING, ED: Glucose-Capillary: 407 mg/dL — ABNORMAL HIGH (ref 70–99)

## 2023-05-28 NOTE — ED Notes (Signed)
 No answer when called several times from lobby

## 2023-05-28 NOTE — ED Triage Notes (Signed)
 Pt to ED via POV c/o hyperglycemia and abscess. Pt reports high blood sugar for 2 days. Last reading said "high". Pt took 50U of insulin  around 2:30pm. Pt also endorses abscess to lower back. Reports fevers, did not take anything PTA

## 2023-05-29 ENCOUNTER — Other Ambulatory Visit: Payer: Self-pay

## 2023-05-29 ENCOUNTER — Emergency Department
Admission: EM | Admit: 2023-05-29 | Discharge: 2023-05-29 | Disposition: A | Discharge status: Home / Self Care | Payer: MEDICAID | Attending: Emergency Medicine | Admitting: Emergency Medicine

## 2023-05-29 ENCOUNTER — Encounter: Payer: Self-pay | Admitting: Intensive Care

## 2023-05-29 DIAGNOSIS — Z794 Long term (current) use of insulin: Secondary | ICD-10-CM | POA: Insufficient documentation

## 2023-05-29 DIAGNOSIS — L03312 Cellulitis of back [any part except buttock]: Secondary | ICD-10-CM | POA: Insufficient documentation

## 2023-05-29 DIAGNOSIS — E119 Type 2 diabetes mellitus without complications: Secondary | ICD-10-CM | POA: Diagnosis not present

## 2023-05-29 DIAGNOSIS — L02212 Cutaneous abscess of back [any part, except buttock]: Secondary | ICD-10-CM | POA: Diagnosis present

## 2023-05-29 HISTORY — DX: Type 2 diabetes mellitus without complications: E11.9

## 2023-05-29 LAB — CBG MONITORING, ED: Glucose-Capillary: 246 mg/dL — ABNORMAL HIGH (ref 70–99)

## 2023-05-29 MED ORDER — METFORMIN HCL 500 MG PO TABS: 500.0000 mg | ORAL_TABLET | Freq: Two times a day (BID) | ORAL | 1 refills | Status: DC

## 2023-05-29 MED ORDER — SULFAMETHOXAZOLE-TRIMETHOPRIM 800-160 MG PO TABS: 1.0000 | ORAL_TABLET | Freq: Two times a day (BID) | ORAL | 0 refills | Status: DC

## 2023-05-29 MED ORDER — SULFAMETHOXAZOLE-TRIMETHOPRIM 800-160 MG PO TABS
1.0000 | ORAL_TABLET | Freq: Once | ORAL | Status: AC
  Administered 2023-05-29: 1 via ORAL
  Filled 2023-05-29: qty 1

## 2023-05-29 NOTE — ED Notes (Signed)
 Lab work was obtained last night 1900, pt left before being seen.

## 2023-05-29 NOTE — ED Triage Notes (Signed)
 Patient has red abscess on lower back X3 days.   Checked in last night and had blood work drawn but left before being placed in room   Reports she is diabetic and was taking ozympic but insurance quit covering cost and she has to make another appointment to get back on insulin . Is not compliant with her medication and reports has not been on anything in about a year

## 2023-05-29 NOTE — ED Provider Notes (Signed)
 [facility] Provider Note    Event Date/Time   First MD Initiated Contact with Patient 05/29/23 1221     (approximate)   History   Abscess and Hyperglycemia   HPI  Pamela Rasmussen is a 43 y.o. female who presents to the ED for evaluation of Abscess and Hyperglycemia   I review her blood work when she checked in last night but was not seen.Hyperglycemia with glucose of 392, pseudohyponatremia with a normal anion gap without acidosis.  Leukocytosis, white count of 15.  Patient presents due to a painful spot on her back.  No trauma or injuries.  No fevers or systemic symptoms.  Diabetic previously on metformin  but she did not like it so she stopped it and has been using her fianc 70/30 insulin  over the past few days due to hyperglycemia at home.   Physical Exam   Triage Vital Signs: ED Triage Vitals  Encounter Vitals Group     BP 05/29/23 1031 (!) 133/91     Systolic BP Percentile --      Diastolic BP Percentile --      Pulse Rate 05/29/23 1031 (!) 120     Resp 05/29/23 1031 20     Temp 05/29/23 1031 99.4 F (37.4 C)     Temp Source 05/29/23 1031 Oral     SpO2 05/29/23 1031 95 %     Weight 05/29/23 1032 239 lb (108.4 kg)     Height 05/29/23 1032 5\' 4"  (1.626 m)     Head Circumference --      Peak Flow --      Pain Score 05/29/23 1031 8     Pain Loc --      Pain Education --      Exclude from Growth Chart --     Most recent vital signs: Vitals:   05/29/23 1031  BP: (!) 133/91  Pulse: (!) 120  Resp: 20  Temp: 99.4 F (37.4 C)  SpO2: 95%    General: Awake, no distress.  Morbidly obese patient, sitting upright and looking systemically well CV:  Good peripheral perfusion.  Resp:  Normal effort.  Abd:  No distention.  MSK:  No deformity noted.  Neuro:  No focal deficits appreciated. Other:  Area of induration to right sided paraspinal lumbar back without fluctuance, drainage.  Appears to be cellulitis.  Approximately 8 x 4  cm   ED Results / Procedures / Treatments   Labs (all labs ordered are listed, but only abnormal results are displayed) Labs Reviewed  CBG MONITORING, ED - Abnormal; Notable for the following components:      Result Value   Glucose-Capillary 246 (*)    All other components within normal limits    EKG   RADIOLOGY   Official radiology report(s): No results found.  PROCEDURES and INTERVENTIONS:  .Critical Care  Performed by: Arline Bennett, MD Authorized by: Arline Bennett, MD   Critical care provider statement:    Critical care time (minutes):  30   Critical care time was exclusive of:  Separately billable procedures and treating other patients   Critical care was necessary to treat or prevent imminent or life-threatening deterioration of the following conditions:  Sepsis   Critical care was time spent personally by me on the following activities:  Development of treatment plan with patient or surrogate, discussions with consultants, evaluation of patient's response to treatment, examination of patient, ordering and review of laboratory studies, ordering and review of radiographic  studies, ordering and performing treatments and interventions, pulse oximetry, re-evaluation of patient's condition and review of old charts   Medications  sulfamethoxazole -trimethoprim  (BACTRIM  DS) 800-160 MG per tablet 1 tablet (has no administration in time range)     IMPRESSION / MDM / ASSESSMENT AND PLAN / ED COURSE  I reviewed the triage vital signs and the nursing notes.  Differential diagnosis includes, but is not limited to, cellulitis, abscess, sepsis  {Patient presents with symptoms of an acute illness or injury that is potentially life-threatening.  Patient presents with evidence of cellulitis to her lower back precipitating hyperglycemia but no DKA, suitable for trial of outpatient management.  Reviewing her blood work, hyperglycemia last night, tachycardia making sepsis possible.  She  look systemically well though.  Glucose improved today, likely from her 70/30 insulin  at home.  We will start her on antibiotics.  I considered admission for this patient but believe she would be suitable for trial of outpatient management.  Also advised her that she needs some sort of medications for diabetes at home and she is willing to try metformin  again so I prescribed her 90 days of this medication.      FINAL CLINICAL IMPRESSION(S) / ED DIAGNOSES   Final diagnoses:  Cellulitis of back except buttock     Rx / DC Orders   ED Discharge Orders          Ordered    metFORMIN  (GLUCOPHAGE ) 500 MG tablet  2 times daily with meals        05/29/23 1308    sulfamethoxazole -trimethoprim  (BACTRIM  DS) 800-160 MG tablet  2 times daily        05/29/23 1308             Note:  This document was prepared using Dragon voice recognition software and may include unintentional dictation errors.   Arline Bennett, MD 05/29/23 1311

## 2023-05-31 ENCOUNTER — Encounter: Payer: Self-pay | Admitting: Emergency Medicine

## 2023-05-31 ENCOUNTER — Inpatient Hospital Stay
Admission: EM | Admit: 2023-05-31 | Discharge: 2023-06-02 | DRG: 872 | Disposition: A | Discharge status: Home / Self Care | Attending: Internal Medicine | Admitting: Internal Medicine

## 2023-05-31 ENCOUNTER — Other Ambulatory Visit: Payer: Self-pay

## 2023-05-31 DIAGNOSIS — E119 Type 2 diabetes mellitus without complications: Secondary | ICD-10-CM

## 2023-05-31 DIAGNOSIS — Z794 Long term (current) use of insulin: Secondary | ICD-10-CM

## 2023-05-31 DIAGNOSIS — F319 Bipolar disorder, unspecified: Secondary | ICD-10-CM

## 2023-05-31 DIAGNOSIS — B3731 Acute candidiasis of vulva and vagina: Secondary | ICD-10-CM | POA: Diagnosis present

## 2023-05-31 DIAGNOSIS — L02212 Cutaneous abscess of back [any part, except buttock]: Secondary | ICD-10-CM | POA: Diagnosis present

## 2023-05-31 DIAGNOSIS — Z7984 Long term (current) use of oral hypoglycemic drugs: Secondary | ICD-10-CM

## 2023-05-31 DIAGNOSIS — E872 Acidosis, unspecified: Secondary | ICD-10-CM | POA: Diagnosis present

## 2023-05-31 DIAGNOSIS — R7989 Other specified abnormal findings of blood chemistry: Secondary | ICD-10-CM

## 2023-05-31 DIAGNOSIS — E1169 Type 2 diabetes mellitus with other specified complication: Secondary | ICD-10-CM | POA: Diagnosis not present

## 2023-05-31 DIAGNOSIS — Z6841 Body Mass Index (BMI) 40.0 and over, adult: Secondary | ICD-10-CM

## 2023-05-31 DIAGNOSIS — Z881 Allergy status to other antibiotic agents status: Secondary | ICD-10-CM

## 2023-05-31 DIAGNOSIS — R6520 Severe sepsis without septic shock: Secondary | ICD-10-CM | POA: Diagnosis present

## 2023-05-31 DIAGNOSIS — F3178 Bipolar disorder, in full remission, most recent episode mixed: Secondary | ICD-10-CM

## 2023-05-31 DIAGNOSIS — Z79899 Other long term (current) drug therapy: Secondary | ICD-10-CM

## 2023-05-31 DIAGNOSIS — E876 Hypokalemia: Secondary | ICD-10-CM | POA: Diagnosis present

## 2023-05-31 DIAGNOSIS — L03312 Cellulitis of back [any part except buttock]: Principal | ICD-10-CM

## 2023-05-31 DIAGNOSIS — A419 Sepsis, unspecified organism: Principal | ICD-10-CM

## 2023-05-31 DIAGNOSIS — L0291 Cutaneous abscess, unspecified: Secondary | ICD-10-CM | POA: Diagnosis not present

## 2023-05-31 DIAGNOSIS — Z818 Family history of other mental and behavioral disorders: Secondary | ICD-10-CM

## 2023-05-31 DIAGNOSIS — Z91148 Patient's other noncompliance with medication regimen for other reason: Secondary | ICD-10-CM

## 2023-05-31 DIAGNOSIS — E1165 Type 2 diabetes mellitus with hyperglycemia: Secondary | ICD-10-CM | POA: Diagnosis present

## 2023-05-31 DIAGNOSIS — Z8279 Family history of other congenital malformations, deformations and chromosomal abnormalities: Secondary | ICD-10-CM

## 2023-05-31 DIAGNOSIS — R739 Hyperglycemia, unspecified: Secondary | ICD-10-CM

## 2023-05-31 DIAGNOSIS — Z880 Allergy status to penicillin: Secondary | ICD-10-CM

## 2023-05-31 DIAGNOSIS — F3162 Bipolar disorder, current episode mixed, moderate: Secondary | ICD-10-CM | POA: Diagnosis present

## 2023-05-31 HISTORY — DX: Other specified abnormal findings of blood chemistry: R79.89

## 2023-05-31 LAB — COMPREHENSIVE METABOLIC PANEL WITH GFR
ALT: 55 U/L — ABNORMAL HIGH (ref 0–44)
AST: 26 U/L (ref 15–41)
Albumin: 3.4 g/dL — ABNORMAL LOW (ref 3.5–5.0)
Alkaline Phosphatase: 127 U/L — ABNORMAL HIGH (ref 38–126)
Anion gap: 9 (ref 5–15)
BUN: 6 mg/dL (ref 6–20)
CO2: 22 mmol/L (ref 22–32)
Calcium: 9 mg/dL (ref 8.9–10.3)
Chloride: 98 mmol/L (ref 98–111)
Creatinine, Ser: 0.98 mg/dL (ref 0.44–1.00)
GFR, Estimated: >60 mL/min
Glucose, Bld: 392 mg/dL — ABNORMAL HIGH (ref 70–99)
Potassium: 3.4 mmol/L — ABNORMAL LOW (ref 3.5–5.1)
Sodium: 129 mmol/L — ABNORMAL LOW (ref 135–145)
Total Bilirubin: 0.5 mg/dL (ref 0.0–1.2)
Total Protein: 7.8 g/dL (ref 6.5–8.1)

## 2023-05-31 LAB — CBC WITH DIFFERENTIAL/PLATELET
Abs Immature Granulocytes: 0.1 10*3/uL — ABNORMAL HIGH (ref 0.00–0.07)
Basophils Absolute: 0.1 10*3/uL (ref 0.0–0.1)
Basophils Relative: 0 %
Eosinophils Absolute: 0.4 10*3/uL (ref 0.0–0.5)
Eosinophils Relative: 2 %
HCT: 40.9 % (ref 36.0–46.0)
Hemoglobin: 14.2 g/dL (ref 12.0–15.0)
Immature Granulocytes: 1 %
Lymphocytes Relative: 14 %
Lymphs Abs: 2.2 10*3/uL (ref 0.7–4.0)
MCH: 32.1 pg (ref 26.0–34.0)
MCHC: 34.7 g/dL (ref 30.0–36.0)
MCV: 92.5 fL (ref 80.0–100.0)
Monocytes Absolute: 1.4 10*3/uL — ABNORMAL HIGH (ref 0.1–1.0)
Monocytes Relative: 9 %
Neutro Abs: 11.6 10*3/uL — ABNORMAL HIGH (ref 1.7–7.7)
Neutrophils Relative %: 74 %
Platelets: 232 10*3/uL (ref 150–400)
RBC: 4.42 MIL/uL (ref 3.87–5.11)
RDW: 12.2 % (ref 11.5–15.5)
WBC: 15.7 10*3/uL — ABNORMAL HIGH (ref 4.0–10.5)
nRBC: 0 % (ref 0.0–0.2)

## 2023-05-31 LAB — GLUCOSE, CAPILLARY
Glucose-Capillary: 481 mg/dL — ABNORMAL HIGH (ref 70–99)
Glucose-Capillary: 424 mg/dL — ABNORMAL HIGH (ref 70–99)
Glucose-Capillary: 285 mg/dL — ABNORMAL HIGH (ref 70–99)
Glucose-Capillary: 311 mg/dL — ABNORMAL HIGH (ref 70–99)

## 2023-05-31 LAB — LACTIC ACID, PLASMA
Lactic Acid, Venous: 2.3 mmol/L (ref 0.5–1.9)
Lactic Acid, Venous: 2.5 mmol/L (ref 0.5–1.9)
Lactic Acid, Venous: 2.1 mmol/L (ref 0.5–1.9)
Lactic Acid, Venous: 2.5 mmol/L (ref 0.5–1.9)

## 2023-05-31 LAB — HIV ANTIBODY (ROUTINE TESTING W REFLEX): HIV Screen 4th Generation wRfx: NONREACTIVE

## 2023-05-31 LAB — HEMOGLOBIN A1C
Hgb A1c MFr Bld: 14.1 % — ABNORMAL HIGH (ref 4.8–5.6)
Mean Plasma Glucose: 357.97 mg/dL

## 2023-05-31 MED ORDER — SODIUM CHLORIDE 0.9 % IV SOLN
1.0000 g | Freq: Once | INTRAVENOUS | Status: AC
  Administered 2023-05-31: 1 g via INTRAVENOUS
  Filled 2023-05-31: qty 10

## 2023-05-31 MED ORDER — ENOXAPARIN SODIUM 60 MG/0.6ML IJ SOSY
55.0000 mg | PREFILLED_SYRINGE | INTRAMUSCULAR | Status: DC
  Administered 2023-05-31 – 2023-06-01 (×2): 55 mg via SUBCUTANEOUS
  Filled 2023-05-31 (×2): qty 0.6

## 2023-05-31 MED ORDER — VANCOMYCIN HCL 1.25 G IV SOLR
1250.0000 mg | Freq: Once | INTRAVENOUS | Status: AC
  Administered 2023-05-31: 1250 mg via INTRAVENOUS
  Filled 2023-05-31: qty 25

## 2023-05-31 MED ORDER — VANCOMYCIN HCL 1750 MG/350ML IV SOLN
1750.0000 mg | INTRAVENOUS | Status: DC
  Filled 2023-05-31: qty 350

## 2023-05-31 MED ORDER — OLANZAPINE 10 MG PO TABS
10.0000 mg | ORAL_TABLET | Freq: Every day | ORAL | Status: DC
  Administered 2023-05-31 – 2023-06-01 (×2): 10 mg via ORAL
  Filled 2023-05-31 (×2): qty 1

## 2023-05-31 MED ORDER — ACETAMINOPHEN 325 MG PO TABS: 650.0000 mg | ORAL_TABLET | Freq: Four times a day (QID) | ORAL | Status: DC | PRN

## 2023-05-31 MED ORDER — VANCOMYCIN HCL 750 MG/150ML IV SOLN: 750.0000 mg | Freq: Two times a day (BID) | INTRAVENOUS | Status: DC

## 2023-05-31 MED ORDER — ONDANSETRON HCL 4 MG/2ML IJ SOLN: 4.0000 mg | Freq: Four times a day (QID) | INTRAMUSCULAR | Status: DC | PRN

## 2023-05-31 MED ORDER — LIDOCAINE HCL (PF) 1 % IJ SOLN
5.0000 mL | Freq: Once | INTRAMUSCULAR | Status: AC
  Administered 2023-05-31: 5 mL
  Filled 2023-05-31: qty 5

## 2023-05-31 MED ORDER — VANCOMYCIN HCL 10 G IV SOLR
2500.0000 mg | Freq: Once | INTRAVENOUS | Status: DC
  Filled 2023-05-31: qty 25

## 2023-05-31 MED ORDER — INSULIN ASPART 100 UNIT/ML IJ SOLN
0.0000 [IU] | Freq: Three times a day (TID) | INTRAMUSCULAR | Status: DC
  Administered 2023-05-31: 15 [IU] via SUBCUTANEOUS
  Filled 2023-05-31: qty 1

## 2023-05-31 MED ORDER — PAROXETINE HCL 20 MG PO TABS
40.0000 mg | ORAL_TABLET | Freq: Every day | ORAL | Status: DC
  Administered 2023-05-31 – 2023-06-01 (×2): 40 mg via ORAL
  Filled 2023-05-31 (×2): qty 2

## 2023-05-31 MED ORDER — VANCOMYCIN HCL 1.25 G IV SOLR
1250.0000 mg | Freq: Once | INTRAVENOUS | Status: AC
  Administered 2023-05-31: 1250 mg via INTRAVENOUS
  Filled 2023-05-31: qty 1250

## 2023-05-31 MED ORDER — LACTATED RINGERS IV SOLN
150.0000 mL/h | INTRAVENOUS | Status: AC
  Administered 2023-05-31 (×3): 150 mL/h via INTRAVENOUS

## 2023-05-31 MED ORDER — SODIUM CHLORIDE 0.9 % IV SOLN
2.0000 g | Freq: Three times a day (TID) | INTRAVENOUS | Status: DC
  Administered 2023-05-31 – 2023-06-02 (×6): 2 g via INTRAVENOUS
  Filled 2023-05-31 (×9): qty 12.5

## 2023-05-31 MED ORDER — DIVALPROEX SODIUM ER 500 MG PO TB24
500.0000 mg | ORAL_TABLET | Freq: Every day | ORAL | Status: DC
  Administered 2023-05-31 – 2023-06-01 (×2): 500 mg via ORAL
  Filled 2023-05-31 (×2): qty 1

## 2023-05-31 MED ORDER — VALACYCLOVIR HCL 500 MG PO TABS
500.0000 mg | ORAL_TABLET | Freq: Every day | ORAL | Status: DC
  Administered 2023-05-31 – 2023-06-01 (×2): 500 mg via ORAL
  Filled 2023-05-31 (×2): qty 1

## 2023-05-31 MED ORDER — LACTATED RINGERS IV BOLUS
1000.0000 mL | Freq: Once | INTRAVENOUS | Status: AC
  Administered 2023-05-31: 1000 mL via INTRAVENOUS

## 2023-05-31 MED ORDER — ONDANSETRON HCL 4 MG PO TABS
4.0000 mg | ORAL_TABLET | Freq: Four times a day (QID) | ORAL | Status: DC | PRN
Start: 2023-05-31 — End: 2023-06-02

## 2023-05-31 MED ORDER — INSULIN ASPART 100 UNIT/ML IJ SOLN
0.0000 [IU] | Freq: Three times a day (TID) | INTRAMUSCULAR | Status: DC
  Administered 2023-05-31: 11 [IU] via SUBCUTANEOUS
  Administered 2023-06-01: 8 [IU] via SUBCUTANEOUS
  Administered 2023-06-01: 11 [IU] via SUBCUTANEOUS
  Administered 2023-06-01: 15 [IU] via SUBCUTANEOUS
  Administered 2023-06-01 – 2023-06-02 (×2): 8 [IU] via SUBCUTANEOUS
  Administered 2023-06-02: 15 [IU] via SUBCUTANEOUS
  Filled 2023-05-31 (×8): qty 1

## 2023-05-31 MED ORDER — LITHIUM CARBONATE 300 MG PO CAPS
300.0000 mg | ORAL_CAPSULE | Freq: Every evening | ORAL | Status: DC
  Administered 2023-05-31 – 2023-06-01 (×2): 300 mg via ORAL
  Filled 2023-05-31 (×3): qty 1

## 2023-05-31 MED ORDER — LIDOCAINE-EPINEPHRINE-TETRACAINE (LET) TOPICAL GEL
3.0000 mL | Freq: Once | TOPICAL | Status: AC
  Administered 2023-05-31: 3 mL via TOPICAL
  Filled 2023-05-31 (×2): qty 3

## 2023-05-31 NOTE — H&P (Addendum)
 History and Physical    Patient: Pamela Rasmussen YNW:[id number] DOB: Jan 05, 1981 DOA: 05/31/2023 DOS: the patient was seen and examined on 05/31/2023 PCP: Mai Schwalbe, FNP  Patient coming from: Home  Chief Complaint:  Chief Complaint  Patient presents with   Wound Check   HPI: Pamela Rasmussen is a 43 y.o. female with medical history significant of obesity, bipolar disorder, anxiety, type 2 diabetes presenting with sepsis and soft tissue abscess/cellulitis.  Patient noted to have been seen on April 25 in the ER for abscess of the low back area.  Patient reports symptoms started roughly 1 to 2 days prior to symptoms.  Was discharged on course of Bactrim  for treatment.  Patient reports she has had worsening symptoms including malaise, fever chills and fatigue at home.  Mild nausea.  No abdominal pain.  Low back area has become more red and tender.  Noted baseline type 2 diabetes.  Blood sugars have been poorly controlled.  No reported alcohol or tobacco use.  Denies any known trauma as initial nidus for area.  Denies any prior episodes like this in the past. Presented to the ER Tmax 99.5, heart rate 100s, BP stable.  Satting well on room air.  White count 15.7, hemoglobin 14.2, platelets 232, lactate 2.3-2.5.  Creatinine 0.98, glucose 392.  Sodium 129. Area of abscess/cellulitis I&Dd at the bedside by Dr. Drenda Gentle.  Review of Systems: As mentioned in the history of present illness. All other systems reviewed and are negative. Past Medical History:  Diagnosis Date   Anxiety    Bipolar 1 disorder (HCC)    Diabetes mellitus without complication (HCC)    Vertigo    Past Surgical History:  Procedure Laterality Date   CHOLECYSTECTOMY     Social History:  reports that she has never smoked. She has never used smokeless tobacco. She reports that she does not drink alcohol and does not use drugs.  Allergies  Allergen Reactions   Levaquin [Levofloxacin In D5w] Itching   Penicillins Rash     Has patient had a PCN reaction causing immediate rash, facial/tongue/throat swelling, SOB or lightheadedness with hypotension: No Has patient had a PCN reaction causing severe rash involving mucus membranes or skin necrosis: No Has patient had a PCN reaction that required hospitalization: No Has patient had a PCN reaction occurring within the last 10 years: No If all of the above answers are "NO", then may proceed with Cephalosporin use.     Family History  Problem Relation Age of Onset   Bipolar disorder Mother    Down syndrome Brother    Schizophrenia Maternal Aunt    Bipolar disorder Paternal Grandmother    Schizophrenia Paternal Grandmother     Prior to Admission medications   Medication Sig Start Date End Date Taking? Authorizing Provider  divalproex  (DEPAKOTE  ER) 500 MG 24 hr tablet Take 1 tablet (500 mg total) by mouth at bedtime. 03/21/20  Yes Eappen, Saramma, MD  lithium  300 MG tablet Take 1 tablet (300 mg total) by mouth daily with breakfast. 07/11/20  Yes Eappen, Saramma, MD  metFORMIN  (GLUCOPHAGE ) 500 MG tablet Take 1 tablet (500 mg total) by mouth 2 (two) times daily with a meal. 05/29/23  Yes Arline Bennett, MD  sulfamethoxazole -trimethoprim  (BACTRIM  DS) 800-160 MG tablet Take 1 tablet by mouth 2 (two) times daily for 7 days. 05/29/23 06/05/23 Yes Arline Bennett, MD  valACYclovir  (VALTREX ) 500 MG tablet Take 1 tablet (500 mg total) by mouth daily. 04/30/23  Yes Catarina Cline  L, NP  clotrimazole  (LOTRIMIN ) 1 % external solution APPLY A PEA SIZED DOSE TO THE AFFECTED AREA TWICE DAILY UNTIL SYMPTOMS RESOLVE 03/18/23   Santana Cue, MD  hydrOXYzine  (VISTARIL ) 25 MG capsule Take 1 capsule (25 mg total) by mouth 3 (three) times daily as needed. Patient not taking: Reported on 03/17/2023 01/04/20   Eappen, Saramma, MD  ketoconazole (NIZORAL) 2 % cream Apply topically. Patient not taking: Reported on 03/17/2023 03/05/20   [provider]  ketoconazole (NIZORAL) 2 % cream ketoconazole 2  % topical cream  APPLY TOPICALLY TWICE DAILY FOR 10 DAYS. REPEAT FOR ADDITIONAL 10 DAYS AS NEEDED Patient not taking: Reported on 03/17/2023    [provider]  ketoconazole (NIZORAL) 2 % shampoo Apply 1 application topically 2 (two) times a week. 02/06/21   [provider]  meclizine  (ANTIVERT ) 25 MG tablet Take 25 mg by mouth 3 (three) times daily as needed. Patient not taking: Reported on 03/17/2023 01/14/21   [provider]  medroxyPROGESTERone (DEPO-PROVERA) 150 MG/ML injection every 3 (three) months Patient not taking: Reported on 03/17/2023    [provider]  metFORMIN  (GLUCOPHAGE ) 500 MG tablet Take 1 tablet (500 mg total) by mouth 2 (two) times daily with a meal. 02/20/23 05/21/23  Buell Carmin, MD  mupirocin  ointment (BACTROBAN ) 2 % Apply to affected area 3 times daily 11/30/22   Menshew, Raye Cai, PA-C  NEOMYCIN-POLYMYXIN-HYDROCORTISONE (CORTISPORIN) 1 % SOLN OTIC solution INSTILL 3 DROPS INTO RIGHT EAR 4 TIMES DAILY FOR 10 DAYS 08/16/19   [provider]  OLANZapine  (ZYPREXA ) 10 MG tablet Take 10 mg by mouth at bedtime.    [provider]  omeprazole (PRILOSEC) 20 MG capsule Take 1 capsule by mouth daily. 05/23/21 05/23/22  [provider]  OZEMPIC, 0.25 OR 0.5 MG/DOSE, 2 MG/1.5ML SOPN Inject 0.25 mg into the skin once a week. 0.25 mg x 4 weeks then increase to 0.5 mg weekly Patient not taking: Reported on 03/17/2023 02/20/21   [provider]  PARoxetine  (PAXIL ) 40 MG tablet TAKE 1 TABLET BY MOUTH ONCE DAILY IN THE MORNING 07/24/20   Eappen, Saramma, MD  silver sulfADIAZINE (SILVADENE) 1 % cream Apply topically. Patient not taking: Reported on 03/17/2023 06/13/20   [provider]    Physical Exam: Vitals:   05/31/23 0824  BP: (!) 150/93  Pulse: (!) 111  Resp: 17  Temp: 99.5 F (37.5 C)  TempSrc: Oral  SpO2: 98%  Weight: 112.9 kg  Height: 5\' 4"  (1.626 m)   Physical Exam Constitutional:       Appearance: She is obese.  HENT:     Head: Normocephalic and atraumatic.     Nose: Nose normal.  Eyes:     Pupils: Pupils are equal, round, and reactive to light.  Cardiovascular:     Rate and Rhythm: Normal rate and regular rhythm.  Pulmonary:     Effort: Pulmonary effort is normal.  Abdominal:     General: Bowel sounds are normal.  Musculoskeletal:        General: Normal range of motion.  Skin:    Comments: + low back redness and tenderness + packing in place  Neurological:     General: No focal deficit present.  Psychiatric:        Mood and Affect: Mood normal.         Data Reviewed:  There are no new results to review at this time.  CT ABDOMEN PELVIS W CONTRAST Addendum: ADDENDUM REPORT: 03/22/2022  17:27   ADDENDUM:  Patient returned for additional imaging through the perineum. There  is an ill-defined mass along the inferior aspect of the left  buttock, below the level of the anus, contiguous with the overlying  skin, measuring approximately 3.6 x 3.1 x 3.6 cm. Inflammatory  change and stranding extends around the periphery of this lesion and  superior to the level of the posterior anus. Findings are consistent  with an early perianal abscess.   Electronically Signed    By: Amanda Jungling M.D.    On: 03/22/2022 17:27 Narrative: CLINICAL DATA:  Abdominal pain.  Possible anal rectal abscess.  EXAM: CT ABDOMEN AND PELVIS WITH CONTRAST  TECHNIQUE: Multidetector CT imaging of the abdomen and pelvis was performed using the standard protocol following bolus administration of intravenous contrast.  RADIATION DOSE REDUCTION: This exam was performed according to the departmental dose-optimization program which includes automated exposure control, adjustment of the mA and/or kV according to patient size and/or use of iterative reconstruction technique.  CONTRAST:  OMNIPAQUE  IOHEXOL  300 MG/ML  SOLN  COMPARISON:  06/03/2013.  FINDINGS: Lower chest: Lung  bases essentially clear.  Hepatobiliary: Decreased attenuation of the liver consistent with fatty infiltration. Liver normal in size. No mass. Status post cholecystectomy. No bile duct dilation.  Pancreas: Unremarkable. No pancreatic ductal dilatation or surrounding inflammatory changes.  Spleen: Normal in size without focal abnormality.  Adrenals/Urinary Tract: Adrenal glands are unremarkable. Kidneys are normal, without renal calculi, focal lesion, or hydronephrosis. Bladder is unremarkable.  Stomach/Bowel: No perirectal or perianal abscess. Perineum not completely included on the field of view.  Normal stomach. Small bowel and colon are normal in caliber. No wall thickening. No inflammation. Normal appendix.  Vascular/Lymphatic: No vascular abnormality.  Mildly enlarged left inguinal lymph node, 1 point 3 cm in short axis. No other enlarged lymph nodes.  Reproductive: Uterus and bilateral adnexa are unremarkable.  Other: No abdominal wall hernia or abnormality. No abdominopelvic ascites.  Musculoskeletal: No fracture or acute finding. No bone lesion. Disc degenerative changes at L5-S1.  IMPRESSION: 1. No acute findings within the abdomen or pelvis. 2. No evidence of perianal or perirectal inflammation or of an abscess. The entire perineum, however, was not fully included on the field of view. 3. Hepatic steatosis.  Electronically Signed: By: Amanda Jungling M.D. On: 03/22/2022 16:49  Lab Results  Component Value Date   WBC 15.7 (H) 05/31/2023   HGB 14.2 05/31/2023   HCT 40.9 05/31/2023   MCV 92.5 05/31/2023   PLT 232 05/31/2023   Last metabolic panel Lab Results  Component Value Date   GLUCOSE 392 (H) 05/31/2023   NA 129 (L) 05/31/2023   K 3.4 (L) 05/31/2023   CL 98 05/31/2023   CO2 22 05/31/2023   BUN 6 05/31/2023   CREATININE 0.98 05/31/2023   GFRNONAA >60 05/31/2023   CALCIUM 9.0 05/31/2023   PROT 7.8 05/31/2023   ALBUMIN 3.4 (L) 05/31/2023    LABGLOB 2.7 06/16/2019   AGRATIO 1.5 06/16/2019   BILITOT 0.5 05/31/2023   ALKPHOS 127 (H) 05/31/2023   AST 26 05/31/2023   ALT 55 (H) 05/31/2023   ANIONGAP 9 05/31/2023    Assessment and Plan: * Sepsis (HCC) Cellulitis Meeting sepsis criteria based on heart rate 100s, white count 15 Noted soft tissue abscess status post I&D in the ER Panculture IV Rocephin  and vancomycin for infectious coverage Lactate 2.3  LR maintenance IV fluids Monitor  T2DM (type 2 diabetes mellitus) (HCC) Blood sugar 390s Active  sepsis is a confounding issue SSI A1c Monitor  Pseudohyponatremia Sodium 129 with blood sugar of 391 Corrected sodium of 135 Monitor  Bipolar 1 disorder, mixed, moderate (HCC) Stable  Cont home regimen        Advance Care Planning:   Code Status: Not on file   Consults: None   Family Communication: No family at the bedside   Severity of Illness: The appropriate patient status for this patient is OBSERVATION. Observation status is judged to be reasonable and necessary in order to provide the required intensity of service to ensure the patient's safety. The patient's presenting symptoms, physical exam findings, and initial radiographic and laboratory data in the context of their medical condition is felt to place them at decreased risk for further clinical deterioration. Furthermore, it is anticipated that the patient will be medically stable for discharge from the hospital within 2 midnights of admission.   Author: Corrinne Din, MD 05/31/2023 10:55 AM  For on call review [email or web address].

## 2023-05-31 NOTE — ED Triage Notes (Signed)
 Patient to ED via POV for wound to back. States dx with cellulitis on Friday and given oral antibiotic. Not getting better per patient.

## 2023-05-31 NOTE — Assessment & Plan Note (Signed)
Stable  Cont home regimen

## 2023-05-31 NOTE — Consult Note (Signed)
 ED Pharmacy Antibiotic Sign Off An antibiotic consult was received from an ED provider for vancomycin per pharmacy dosing for cellulitis. A chart review was completed to assess appropriateness.   The following one time order(s) were placed:  Vancomycin 2.5 g IV x 1 (22 mg/kg)  Further antibiotic and/or antibiotic pharmacy consults should be ordered by the admitting provider if indicated.   Thank you for allowing pharmacy to be a part of this patient's care.   Craven Do, PharmD Pharmacy Resident  05/31/2023 9:25 AM

## 2023-05-31 NOTE — Assessment & Plan Note (Addendum)
 Cellulitis Meeting sepsis criteria based on heart rate 100s, white count 15 Noted soft tissue abscess status post I&D in the ER Panculture IV Rocephin  and vancomycin for infectious coverage Lactate 2.3  LR maintenance IV fluids Monitor

## 2023-05-31 NOTE — Consult Note (Addendum)
 Pharmacy Antibiotic Note  Pamela Rasmussen is a 43 y.o. female admitted on 05/31/2023 with cellulitis. Patient presented to the ED for an abscess in their lower back with worsening symptoms. They presented to the ED 1 - 2 days ago and were were treated with Bactrim  4/25 x 7 days. Pharmacy has been consulted for Cefepime and Vancomycin dosing.   Today, 05/31/2023 Scr 0.98 (at baseline) WBC 15.7 (elevated) Afebrile over last 24 hours No imaging   Plan: Day 1 of antibiotics Gave vancomycin 2500 mg IV x1  Then 1750 mg IV Q24H. Goal AUC 400-550. Expected AUC: 534.5 Expected Css min: 11.5 SCr used: 0.98  Weight used: IBW, Vd used: 0.5 (BMI 42) Patient is also on Cefepime 2g Q8H Continue to monitor renal function and follow culture results  Height: 5\' 4"  (162.6 cm) Weight: 112.9 kg (249 lb) IBW/kg (Calculated) : 54.7  Temp (24hrs), Avg:99.5 F (37.5 C), Min:99.5 F (37.5 C), Max:99.5 F (37.5 C)  Recent Labs  Lab 05/28/23 1908 05/31/23 0826 05/31/23 1037  WBC 15.0* 15.7*  --   CREATININE 0.96 0.98  --   LATICACIDVEN  --  2.3* 2.5*    Estimated Creatinine Clearance: 92.1 mL/min (by C-G formula based on SCr of 0.98 mg/dL).    Allergies  Allergen Reactions   Levaquin [Levofloxacin In D5w] Itching   Penicillins Rash    Has patient had a PCN reaction causing immediate rash, facial/tongue/throat swelling, SOB or lightheadedness with hypotension: No Has patient had a PCN reaction causing severe rash involving mucus membranes or skin necrosis: No Has patient had a PCN reaction that required hospitalization: No Has patient had a PCN reaction occurring within the last 10 years: No If all of the above answers are "NO", then may proceed with Cephalosporin use.    Antimicrobials this admission: 4/27 Ceftriaxone  1g x 1 4/27 Vancomycin  >>  4/27 Cefepime >>   Dose adjustments this admission: NA  Microbiology results: No cultures at this time  Thank you for allowing  pharmacy to be a part of this patient's care.  Craven Do, PharmD Pharmacy Resident  05/31/2023 1:12 PM

## 2023-05-31 NOTE — ED Provider Notes (Signed)
 [facility] Provider Note    Event Date/Time   First MD Initiated Contact with Patient 05/31/23 0848     (approximate)   History   Wound Check   HPI  Pamela Rasmussen is a 43 y.o. female with history of diabetes, bipolar disorder, anxiety, presenting with redness to her back.  States that been ongoing for several days, had a fever of 100.4 earlier.  No nausea vomiting.  No weakness or numbness.  Has been on Bactrim  since she was diagnosed with cellulitis to her back 2 days ago.  States that she feels like the pain is getting worse and that the antibiotics has not been working.  On admission chart review, she was seen on 25 April, had a BMP done that showed hyponatremia likely secondary to elevated glucose, she was discharged on Bactrim  that she takes twice daily.     Physical Exam   Triage Vital Signs: ED Triage Vitals [05/31/23 0824]  Encounter Vitals Group     BP (!) 150/93     Systolic BP Percentile      Diastolic BP Percentile      Pulse Rate (!) 111     Resp 17     Temp 99.5 F (37.5 C)     Temp Source Oral     SpO2 98 %     Weight 249 lb (112.9 kg)     Height 5\' 4"  (1.626 m)     Head Circumference      Peak Flow      Pain Score 10     Pain Loc      Pain Education      Exclude from Growth Chart     Most recent vital signs: Vitals:   05/31/23 0824  BP: (!) 150/93  Pulse: (!) 111  Resp: 17  Temp: 99.5 F (37.5 C)  SpO2: 98%     General: Awake, no distress.  CV:  Good peripheral perfusion.  Tachycardic Resp:  Normal effort.  Abd:  No distention.  Soft nontender Other:  There is an area of erythema that is about 10 x 5 cm to her right lower back that is indurated, tender to palpation, warm.   ED Results / Procedures / Treatments   Labs (all labs ordered are listed, but only abnormal results are displayed) Labs Reviewed  LACTIC ACID, PLASMA - Abnormal; Notable for the following components:      Result Value    Lactic Acid, Venous 2.3 (*)    All other components within normal limits  COMPREHENSIVE METABOLIC PANEL WITH GFR - Abnormal; Notable for the following components:   Sodium 129 (*)    Potassium 3.4 (*)    Glucose, Bld 392 (*)    Albumin 3.4 (*)    ALT 55 (*)    Alkaline Phosphatase 127 (*)    All other components within normal limits  CBC WITH DIFFERENTIAL/PLATELET - Abnormal; Notable for the following components:   WBC 15.7 (*)    Neutro Abs 11.6 (*)    Monocytes Absolute 1.4 (*)    Abs Immature Granulocytes 0.10 (*)    All other components within normal limits  LACTIC ACID, PLASMA     PROCEDURES:  Critical Care performed: Yes, see critical care procedure note(s)  .Ultrasound ED Soft Tissue  Date/Time: 05/31/2023 9:09 AM  Performed by: Shane Darling, MD Authorized by: Shane Darling, MD   Procedure details:    Indications: localization of abscess  Transverse view:  Visualized   Longitudinal view:  Visualized   Images: not archived   Location:    Location: lower back     Side:  Right Findings:     abscess present    cellulitis present .Incision and Drainage  Date/Time: 05/31/2023 10:41 AM  Performed by: Shane Darling, MD Authorized by: Shane Darling, MD   Consent:    Consent obtained:  Verbal   Consent given by:  Patient   Risks discussed:  Bleeding, incomplete drainage and pain   Alternatives discussed:  No treatment and alternative treatment Location:    Type:  Abscess   Location:  Trunk   Trunk location:  Back Pre-procedure details:    Skin preparation:  Chlorhexidine Sedation:    Sedation type:  None Anesthesia:    Anesthesia method:  Topical application and local infiltration   Topical anesthetic:  LET   Local anesthetic:  Lidocaine  1% w/o epi Procedure type:    Complexity:  Complex Procedure details:    Incision types:  Stab incision   Wound management:  Probed and deloculated   Drainage:  Bloody and purulent   Drainage amount:  Moderate   Wound  treatment:  Wound left open   Packing material: gauze packing placed. Post-procedure details:    Procedure completion:  Tolerated well, no immediate complications .Critical Care  Performed by: Shane Darling, MD Authorized by: Shane Darling, MD   Critical care provider statement:    Critical care time (minutes):  40   Critical care was necessary to treat or prevent imminent or life-threatening deterioration of the following conditions:  Sepsis   Critical care was time spent personally by me on the following activities:  Development of treatment plan with patient or surrogate, discussions with consultants, evaluation of patient's response to treatment, examination of patient, ordering and review of laboratory studies, ordering and review of radiographic studies, ordering and performing treatments and interventions, pulse oximetry, re-evaluation of patient's condition and review of old charts    MEDICATIONS ORDERED IN ED: Medications  Vancomycin (VANCOCIN) 1,250 mg in sodium chloride  0.9 % 250 mL IVPB (has no administration in time range)    Followed by  Vancomycin (VANCOCIN) 1,250 mg in sodium chloride  0.9 % 250 mL IVPB (has no administration in time range)  lidocaine -EPINEPHrine -tetracaine (LET) topical gel (3 mLs Topical Given 05/31/23 0951)  lidocaine  (PF) (XYLOCAINE ) 1 % injection 5 mL (5 mLs Other Given by Other 05/31/23 1032)  lactated ringers  bolus 1,000 mL (1,000 mLs Intravenous New Bag/Given 05/31/23 0947)  cefTRIAXone  (ROCEPHIN ) 1 g in sodium chloride  0.9 % 100 mL IVPB (0 g Intravenous Stopped 05/31/23 1032)     IMPRESSION / MDM / ASSESSMENT AND PLAN / ED COURSE  I reviewed the triage vital signs and the nursing notes.                              Differential diagnosis includes, but is not limited to, sepsis, cellulitis, abscess, she has tenderness to the area but no pain or proportion or numbness, considered but doubt deep space infection or necrotizing fasciitis at this time.    Patient's presentation is most consistent with acute presentation with potential threat to life or bodily function.  Independent review of labs below.  Bedside ultrasound showed cellulitis with underlying abscess.  Will plan to I&D, will plan to start her on IV antibiotics.  Given that she is tachycardic and febrile  at home, as well as her lactate being elevated to 2.3, we will plan to have her admitted to monitor and make sure that she does not worsen.  I&D was done without complications.  IV antibiotics was ordered and started.  Given that she was tachycardic, febrile, had a elevated leukocytosis as well as mildly elevated lactate, she is at high risk and will need to be admitted for further management.  Consult to hospitalist was agreeable to plan for admission will evaluate the patient.  She is admitted.  Clinical Course as of 05/31/23 1043  Sun May 31, 2023  1042 Independent review of labs, mild acidosis, she is leukocytosis, hyponatremia that suspect is pseudohyponatremia in the setting of elevated glucose, no acidosis or anion gap. [TT]    Clinical Course User Index [TT] Drenda Gentle, Richard Champion, MD     FINAL CLINICAL IMPRESSION(S) / ED DIAGNOSES   Final diagnoses:  Cellulitis of back except buttock  Abscess  Hyperglycemia     Rx / DC Orders   ED Discharge Orders     None        Note:  This document was prepared using Dragon voice recognition software and may include unintentional dictation errors.     Shane Darling, MD 05/31/23 [phone]

## 2023-05-31 NOTE — ED Notes (Signed)
 Advised nurse that patient has ready bed

## 2023-05-31 NOTE — Plan of Care (Signed)

## 2023-05-31 NOTE — Plan of Care (Addendum)
  Problem: Education: Goal: Knowledge of General Education information will improve Description: Including pain rating scale, medication(s)/side effects and non-pharmacologic comfort measures Outcome: Progressing   Problem: Health Behavior/Discharge Planning: Goal: Ability to manage health-related needs will improve Outcome: Progressing   Problem: Clinical Measurements: Goal: Ability to maintain clinical measurements within normal limits will improve Outcome: Progressing Goal: Will remain free from infection Outcome: Progressing Goal: Diagnostic test results will improve Outcome: Progressing Goal: Respiratory complications will improve Outcome: Progressing Goal: Cardiovascular complication will be avoided Outcome: Progressing   Problem: Activity: Goal: Risk for activity intolerance will decrease Outcome: Progressing   Problem: Elimination: Goal: Will not experience complications related to bowel motility Outcome: Progressing Goal: Will not experience complications related to urinary retention Outcome: Progressing   Problem: Pain Managment: Goal: General experience of comfort will improve and/or be controlled Outcome: Progressing   Problem: Safety: Goal: Ability to remain free from injury will improve Outcome: Progressing

## 2023-05-31 NOTE — ED Notes (Signed)
 Waiting on Vancomycin from pharm at this time.

## 2023-05-31 NOTE — Assessment & Plan Note (Signed)
 Blood sugar 390s Active sepsis is a confounding issue SSI A1c Monitor

## 2023-05-31 NOTE — Progress Notes (Signed)
 Pt blood sugar is 481, no any other symptoms noted. MD notified. Gave insulin  based on sliding scale. No any new order received.

## 2023-05-31 NOTE — Assessment & Plan Note (Signed)
 Sodium 129 with blood sugar of 391 Corrected sodium of 135 Monitor

## 2023-06-01 ENCOUNTER — Telehealth (HOSPITAL_COMMUNITY): Payer: Self-pay | Admitting: Pharmacy Technician

## 2023-06-01 ENCOUNTER — Other Ambulatory Visit (HOSPITAL_COMMUNITY): Payer: Self-pay

## 2023-06-01 DIAGNOSIS — Z7984 Long term (current) use of oral hypoglycemic drugs: Secondary | ICD-10-CM | POA: Diagnosis not present

## 2023-06-01 DIAGNOSIS — L03312 Cellulitis of back [any part except buttock]: Secondary | ICD-10-CM

## 2023-06-01 DIAGNOSIS — E1165 Type 2 diabetes mellitus with hyperglycemia: Secondary | ICD-10-CM | POA: Diagnosis present

## 2023-06-01 DIAGNOSIS — Z91148 Patient's other noncompliance with medication regimen for other reason: Secondary | ICD-10-CM | POA: Diagnosis not present

## 2023-06-01 DIAGNOSIS — Z881 Allergy status to other antibiotic agents status: Secondary | ICD-10-CM | POA: Diagnosis not present

## 2023-06-01 DIAGNOSIS — Z818 Family history of other mental and behavioral disorders: Secondary | ICD-10-CM | POA: Diagnosis not present

## 2023-06-01 DIAGNOSIS — R6520 Severe sepsis without septic shock: Secondary | ICD-10-CM

## 2023-06-01 DIAGNOSIS — F3162 Bipolar disorder, current episode mixed, moderate: Secondary | ICD-10-CM | POA: Diagnosis present

## 2023-06-01 DIAGNOSIS — L02212 Cutaneous abscess of back [any part, except buttock]: Secondary | ICD-10-CM

## 2023-06-01 DIAGNOSIS — Z79899 Other long term (current) drug therapy: Secondary | ICD-10-CM | POA: Diagnosis not present

## 2023-06-01 DIAGNOSIS — Z880 Allergy status to penicillin: Secondary | ICD-10-CM | POA: Diagnosis not present

## 2023-06-01 DIAGNOSIS — Z8279 Family history of other congenital malformations, deformations and chromosomal abnormalities: Secondary | ICD-10-CM | POA: Diagnosis not present

## 2023-06-01 DIAGNOSIS — A419 Sepsis, unspecified organism: Secondary | ICD-10-CM | POA: Diagnosis present

## 2023-06-01 DIAGNOSIS — E876 Hypokalemia: Secondary | ICD-10-CM | POA: Diagnosis present

## 2023-06-01 DIAGNOSIS — B3731 Acute candidiasis of vulva and vagina: Secondary | ICD-10-CM | POA: Diagnosis present

## 2023-06-01 DIAGNOSIS — Z794 Long term (current) use of insulin: Secondary | ICD-10-CM | POA: Diagnosis not present

## 2023-06-01 DIAGNOSIS — Z6841 Body Mass Index (BMI) 40.0 and over, adult: Secondary | ICD-10-CM | POA: Diagnosis not present

## 2023-06-01 DIAGNOSIS — L0291 Cutaneous abscess, unspecified: Secondary | ICD-10-CM | POA: Diagnosis present

## 2023-06-01 DIAGNOSIS — E872 Acidosis, unspecified: Secondary | ICD-10-CM | POA: Diagnosis present

## 2023-06-01 HISTORY — DX: Cutaneous abscess of back (any part, except buttock): L02.212

## 2023-06-01 HISTORY — DX: Cellulitis of back (any part except buttock): L03.312

## 2023-06-01 LAB — COMPREHENSIVE METABOLIC PANEL WITH GFR
ALT: 38 U/L (ref 0–44)
AST: 17 U/L (ref 15–41)
Albumin: 2.7 g/dL — ABNORMAL LOW (ref 3.5–5.0)
Alkaline Phosphatase: 103 U/L (ref 38–126)
Anion gap: 9 (ref 5–15)
BUN: 7 mg/dL (ref 6–20)
CO2: 26 mmol/L (ref 22–32)
Calcium: 8.5 mg/dL — ABNORMAL LOW (ref 8.9–10.3)
Chloride: 101 mmol/L (ref 98–111)
Creatinine, Ser: 0.82 mg/dL (ref 0.44–1.00)
GFR, Estimated: >60 mL/min (ref >60)
Glucose, Bld: 280 mg/dL — ABNORMAL HIGH (ref 70–99)
Potassium: 3.5 mmol/L (ref 3.5–5.1)
Sodium: 133 mmol/L — ABNORMAL LOW (ref 135–145)
Total Bilirubin: 0.6 mg/dL (ref 0.0–1.2)
Total Protein: 6.4 g/dL — ABNORMAL LOW (ref 6.5–8.1)

## 2023-06-01 LAB — CBC
HCT: 36.4 % (ref 36.0–46.0)
Hemoglobin: 12.5 g/dL (ref 12.0–15.0)
MCH: 32.8 pg (ref 26.0–34.0)
MCHC: 34.3 g/dL (ref 30.0–36.0)
MCV: 95.5 fL (ref 80.0–100.0)
Platelets: 201 10*3/uL (ref 150–400)
RBC: 3.81 MIL/uL — ABNORMAL LOW (ref 3.87–5.11)
RDW: 12.4 % (ref 11.5–15.5)
WBC: 12.8 10*3/uL — ABNORMAL HIGH (ref 4.0–10.5)
nRBC: 0 % (ref 0.0–0.2)

## 2023-06-01 LAB — GLUCOSE, CAPILLARY
Glucose-Capillary: 316 mg/dL — ABNORMAL HIGH (ref 70–99)
Glucose-Capillary: 327 mg/dL — ABNORMAL HIGH (ref 70–99)
Glucose-Capillary: 392 mg/dL — ABNORMAL HIGH (ref 70–99)
Glucose-Capillary: 295 mg/dL — ABNORMAL HIGH (ref 70–99)
Glucose-Capillary: 260 mg/dL — ABNORMAL HIGH (ref 70–99)

## 2023-06-01 MED ORDER — INSULIN GLARGINE-YFGN 100 UNIT/ML ~~LOC~~ SOLN
30.0000 [IU] | Freq: Every day | SUBCUTANEOUS | Status: DC
  Administered 2023-06-01 – 2023-06-02 (×2): 30 [IU] via SUBCUTANEOUS
  Filled 2023-06-01 (×2): qty 0.3

## 2023-06-01 MED ORDER — INSULIN ASPART 100 UNIT/ML IJ SOLN
5.0000 [IU] | Freq: Three times a day (TID) | INTRAMUSCULAR | Status: DC
  Administered 2023-06-01 (×2): 5 [IU] via SUBCUTANEOUS
  Filled 2023-06-01 (×2): qty 1

## 2023-06-01 MED ORDER — INSULIN STARTER KIT- PEN NEEDLES (ENGLISH)
1.0000 | Freq: Once | Status: AC
  Administered 2023-06-01: 1
  Filled 2023-06-01: qty 1

## 2023-06-01 MED ORDER — INSULIN GLARGINE-YFGN 100 UNIT/ML ~~LOC~~ SOLN
20.0000 [IU] | Freq: Every day | SUBCUTANEOUS | Status: DC
Start: 2023-06-01 — End: 2023-06-01

## 2023-06-01 MED ORDER — LIVING WELL WITH DIABETES BOOK
Freq: Once | Status: AC
  Filled 2023-06-01: qty 1

## 2023-06-01 MED ORDER — VANCOMYCIN HCL IN DEXTROSE 1-5 GM/200ML-% IV SOLN
1000.0000 mg | Freq: Two times a day (BID) | INTRAVENOUS | Status: DC
  Administered 2023-06-01 – 2023-06-02 (×2): 1000 mg via INTRAVENOUS
  Filled 2023-06-01 (×2): qty 200

## 2023-06-01 MED ORDER — FLUCONAZOLE 50 MG PO TABS
150.0000 mg | ORAL_TABLET | Freq: Once | ORAL | Status: AC
  Administered 2023-06-01: 150 mg via ORAL
  Filled 2023-06-01: qty 1

## 2023-06-01 NOTE — Plan of Care (Signed)

## 2023-06-01 NOTE — Consult Note (Signed)
 [facility] SURGICAL CONSULTATION NOTE (initial) - cpt: 16109   HISTORY OF PRESENT ILLNESS (HPI):  43 y.o. female presented to [facility] ED yesterday for evaluation of possible back abscess. Patient recently seen in the ED on 04/25 for cellulitis to her lower back as well as hypoglycemia. At that time, there was no appreciable abscess and she was given PO Abx (Bactrim ). Unfortunately, her tenderness in this area persisted and she did develop fever at home to 100.54F. She denied any trauma or injury to this area. On presentation to the ED she was afebrile but tachycardic. Work up in the ED revealed a leukocytosis to 15.7K (now 12.8K), Hgb to 14.2, hypokalemia to 3.4. glucose 392, sCr - 0.98, venous lactate 2.3 (last check 2.5). Her HgbA1c is 14.1. She did have bedside US  by EDP concerning for abscess and underwent I&D. She was admitted for IV Abx (currently on Cefepime, Vancomycin) as well as glucose control.    Surgery is consulted by hospitalist physician Dr. Sheril Dines, MD in this context for evaluation and management of back abscess.  PAST MEDICAL HISTORY (PMH):  Past Medical History:  Diagnosis Date   Anxiety    Bipolar 1 disorder (HCC)    Diabetes mellitus without complication (HCC)    Vertigo      PAST SURGICAL HISTORY (PSH):  Past Surgical History:  Procedure Laterality Date   CHOLECYSTECTOMY       MEDICATIONS:  Prior to Admission medications   Medication Sig Start Date End Date Taking? Authorizing Provider  divalproex  (DEPAKOTE  ER) 500 MG 24 hr tablet Take 1 tablet (500 mg total) by mouth at bedtime. 03/21/20  Yes Eappen, Saramma, MD  lithium  300 MG tablet Take 1 tablet (300 mg total) by mouth daily with breakfast. Patient taking differently: Take 300 mg by mouth every evening. 07/11/20  Yes Eappen, Saramma, MD  metFORMIN  (GLUCOPHAGE ) 500 MG tablet Take 1 tablet (500 mg total) by mouth 2 (two) times daily with a meal. 02/20/23 05/31/23 Yes Buell Carmin, MD  OLANZapine   (ZYPREXA ) 10 MG tablet Take 10 mg by mouth at bedtime.   Yes [provider]  PARoxetine  (PAXIL ) 40 MG tablet Take 40 mg by mouth daily. 01/05/23 01/05/24 Yes [provider]  sulfamethoxazole -trimethoprim  (BACTRIM  DS) 800-160 MG tablet Take 1 tablet by mouth 2 (two) times daily for 7 days. 05/29/23 06/05/23 Yes Arline Bennett, MD  valACYclovir  (VALTREX ) 500 MG tablet Take 1 tablet (500 mg total) by mouth daily. 04/30/23  Yes Idol, Leigh Punch, NP     ALLERGIES:  Allergies  Allergen Reactions   Levaquin [Levofloxacin In D5w] Itching   Penicillins Rash    Has patient had a PCN reaction causing immediate rash, facial/tongue/throat swelling, SOB or lightheadedness with hypotension: No Has patient had a PCN reaction causing severe rash involving mucus membranes or skin necrosis: No Has patient had a PCN reaction that required hospitalization: No Has patient had a PCN reaction occurring within the last 10 years: No If all of the above answers are "NO", then may proceed with Cephalosporin use.      SOCIAL HISTORY:  Social History   Socioeconomic History   Marital status: Single    Spouse name: Not on file   Number of children: 9   Years of education: Not on file   Highest education level: Associate degree: occupational, Scientist, product/process development, or vocational program  Occupational History    Comment: not employed  Tobacco Use   Smoking status: Never   Smokeless tobacco: Never  Vaping Use   Vaping status: Never Used  Substance and Sexual Activity   Alcohol use: No   Drug use: No   Sexual activity: Yes    Partners: Male    Birth control/protection: Condom    Comment: Condoms sometimes  Other Topics Concern   Not on file  Social History Narrative   Not on file   Social Drivers of Health   Financial Resource Strain: Low Risk  (04/22/2022)   Received from [facility]   Overall Financial Resource Strain (CARDIA)    Difficulty of Paying Living Expenses: Not very hard  Food  Insecurity: No Food Insecurity (05/31/2023)   Hunger Vital Sign    Worried About Running Out of Food in the Last Year: Never true    Ran Out of Food in the Last Year: Never true  Transportation Needs: No Transportation Needs (05/31/2023)   PRAPARE - Administrator, Civil Service (Medical): No    Lack of Transportation (Non-Medical): No  Physical Activity: Sufficiently Active (02/06/2021)   Received from [facility], [facility]   Exercise Vital Sign    Days of Exercise per Week: 6 days    Minutes of Exercise per Session: 120 min  Stress: Stress Concern Present (01/04/2018)   [employer] of Occupational Health - Occupational Stress Questionnaire    Feeling of Stress : Rather much  Social Connections: Unknown (05/31/2023)   Social Connection and Isolation Panel [NHANES]    Frequency of Communication with Friends and Family: Not on file    Frequency of Social Gatherings with Friends and Family: Not on file    Attends Religious Services: Not on file    Active Member of Clubs or Organizations: Not on file    Attends Banker Meetings: Not on file    Marital Status: Living with partner  Intimate Partner Violence: Not At Risk (05/31/2023)   Humiliation, Afraid, Rape, and Kick questionnaire    Fear of Current or Ex-Partner: No    Emotionally Abused: No    Physically Abused: No    Sexually Abused: No     FAMILY HISTORY:  Family History  Problem Relation Age of Onset   Bipolar disorder Mother    Down syndrome Brother    Schizophrenia Maternal Aunt    Bipolar disorder Paternal Grandmother    Schizophrenia Paternal Grandmother       REVIEW OF SYSTEMS:  Review of Systems  Constitutional:  Negative for chills and fever.  Cardiovascular:  Negative for chest pain and palpitations.  Gastrointestinal:  Negative for abdominal pain, nausea and vomiting.  Skin:  Negative for itching and rash.       + Back Abscess  All other systems reviewed and are  negative.   VITAL SIGNS:  Temp:  [98 F (36.7 C)-100 F (37.8 C)] 98.3 F (36.8 C) (04/28 1135) Pulse Rate:  [90-108] 99 (04/28 1135) Resp:  [16-18] 18 (04/28 1135) BP: (96-136)/(54-84) 96/56 (04/28 1135) SpO2:  [96 %-99 %] 97 % (04/28 1135)     Height: 5\' 4"  (162.6 cm) Weight: 112.9 kg BMI (Calculated): 42.72   INTAKE/OUTPUT:  04/27 0701 - 04/28 0700 In: 2516.9 [I.V.:2216.4; IV Piggyback:300.6] Out: -   PHYSICAL EXAM:  Physical Exam Vitals and nursing note reviewed. Exam conducted with a chaperone present.  Constitutional:      General: She is not in acute distress.    Appearance: Normal appearance. She is not ill-appearing.     Comments: Sitting up  in bed; NAD  HENT:     Head: Normocephalic and atraumatic.  Eyes:     General: No scleral icterus.    Conjunctiva/sclera: Conjunctivae normal.     Comments: Wearing glasses   Cardiovascular:     Rate and Rhythm: Normal rate and regular rhythm.  Pulmonary:     Effort: Pulmonary effort is normal. No respiratory distress.  Genitourinary:    Comments: Deferred Skin:    General: Skin is warm and dry.     Findings: Abscess present.     Comments: To the right lower back there is I&D site, packing in place, scant seropurulent drainage expressible, erythema improved compared to picture in ED. No appreciable undrained collections   Neurological:     General: No focal deficit present.     Mental Status: She is alert and oriented to person, place, and time. Mental status is at baseline.  Psychiatric:        Mood and Affect: Mood normal.        Behavior: Behavior normal.      Labs:     Latest Ref Rng & Units 06/01/2023    4:20 AM 05/31/2023    8:26 AM 05/28/2023    7:08 PM  CBC  WBC 4.0 - 10.5 K/uL 12.8  15.7  15.0   Hemoglobin 12.0 - 15.0 g/dL 28.4  13.2  44.0   Hematocrit 36.0 - 46.0 % 36.4  40.9  40.9   Platelets 150 - 400 K/uL 201  232  220       Latest Ref Rng & Units 06/01/2023    4:20 AM 05/31/2023    8:26 AM  05/28/2023    7:08 PM  CMP  Glucose 70 - 99 mg/dL 102  725  366   BUN 6 - 20 mg/dL 7  6  7    Creatinine 0.44 - 1.00 mg/dL 4.40  3.47  4.25   Sodium 135 - 145 mmol/L 133  129  127   Potassium 3.5 - 5.1 mmol/L 3.5  3.4  3.8   Chloride 98 - 111 mmol/L 101  98  93   CO2 22 - 32 mmol/L 26  22  21    Calcium 8.9 - 10.3 mg/dL 8.5  9.0  9.2   Total Protein 6.5 - 8.1 g/dL 6.4  7.8    Total Bilirubin 0.0 - 1.2 mg/dL 0.6  0.5    Alkaline Phos 38 - 126 U/L 103  127    AST 15 - 41 U/L 17  26    ALT 0 - 44 U/L 38  55       Imaging studies:  No new imaging studies    Assessment/Plan: 44 y.o. female with lower back cellulitis with abscess s/p I&D by EDP, complicated by pertinent comorbidities including poorly controlled T2DM with HgbA1c 14.   - I&D preformed in the ED appears adequate with significant improvement in appearance compared to pictures taken yesterday. No need for additional procedures at this time.    - Wound Care: pack wound daily with dry gauze or packing if available, cover, secure. Okay to remove to shower.    - Continue IV Abx (Cefepime, Vancomycin); No Cx available  - Pain control prn  - Correct hyperglycemia   - Further management per primary service; we will follow along   All of the above findings and recommendations were discussed with the patient his/her family, and all of patient's questions were answered to her expressed satisfaction.  Thank you for  the opportunity to participate in this patient's care.   -- Apolonio Bay, PA-C [facility] 06/01/2023, 12:31 PM M-F: 7am - 4pm

## 2023-06-01 NOTE — Progress Notes (Signed)
 BS 311 @ 2018, contacted Brion Cancel MD New order for PM sliding scale given BS addressed

## 2023-06-01 NOTE — Telephone Encounter (Signed)
 Pharmacy Patient Advocate Encounter   Received notification from Inpatient Request that prior authorization for FreeStyle Libre 3 Plus Sensor is required/requested.   Insurance verification completed.   The patient is insured through  [place]   .   Per test claim: PA required; PA submitted to above mentioned insurance via CoverMyMeds Key/confirmation #/EOC [id number] Status is pending

## 2023-06-01 NOTE — TOC Initial Note (Signed)
 Transition of Care [facility]) - Initial/Assessment Note    Patient Details  Name: Pamela Rasmussen MRN: [id number] Date of Birth: [birth year]  [facility]) CM/SW Contact:    Elsie Halo, RN Phone Number: 06/01/2023, 1:56 PM  Clinical Narrative:                  Patient is from home with her mom and fiance. She drives and has no difficulty obtaining meds or getting to appts. She has a PCP and doesn't use any DME.  No TOC needs. Please outreach to [facility] if needs are identified.       Patient Goals and CMS Choice            Expected Discharge Plan and Services                                              Prior Living Arrangements/Services                       Activities of Daily Living   ADL Screening (condition at time of admission) Independently performs ADLs?: Yes (appropriate for developmental age) Is the patient deaf or have difficulty hearing?: No Does the patient have difficulty seeing, even when wearing glasses/contacts?: No Does the patient have difficulty concentrating, remembering, or making decisions?: No  Permission Sought/Granted                  Emotional Assessment              Admission diagnosis:  Abscess [L02.91] Hyperglycemia [R73.9] Cellulitis of back except buttock [L03.312] Sepsis (HCC) [A41.9] Patient Active Problem List   Diagnosis Date Noted   Abscess of lower back 06/01/2023   Cellulitis of back except buttock 06/01/2023   Severe sepsis (HCC) 05/31/2023   Pseudohyponatremia 05/31/2023   Diarrhea 03/27/2022   Perianal abscess 03/27/2022   T2DM (type 2 diabetes mellitus) (HCC) 10/01/2020   At risk for prolonged QT interval syndrome 07/11/2020   GAD (generalized anxiety disorder) 11/03/2019   Bipolar disorder, in full remission, most recent episode mixed (HCC) 01/25/2019   Bipolar disorder, in partial remission, most recent episode mixed (HCC) 01/06/2019   Morbid (severe) obesity due to  excess calories (HCC) 12/23/2018   High risk medication use 10/26/2018   Bipolar 1 disorder, mixed, moderate (HCC) 10/05/2018   Chronic post-traumatic stress disorder (PTSD) 10/05/2018   Social anxiety disorder 10/05/2018   Bipolar depression (HCC) 05/05/2017   BMI 45.0-49.9, adult (HCC) 05/05/2017   PCP:  Mai Schwalbe, FNP Pharmacy:   [facility] [address]), [place] - [address] [place] ([place]) [place] [address] Phone: [phone] Fax: [phone]  [facility] [address] [place] - [address] [place] [address] Phone: [phone] Fax: [phone]     Social Drivers of Health (SDOH) Social History: SDOH Screenings   Food Insecurity: No Food Insecurity (05/31/2023)  Housing: Low Risk  (05/31/2023)  Transportation Needs: No Transportation Needs (05/31/2023)  Utilities: Not At Risk (05/31/2023)  Depression (PHQ2-9): Low Risk  (07/11/2020)  Financial Resource Strain: Low Risk  (04/22/2022)   Received from [facility]  Physical Activity: Sufficiently Active (02/06/2021)   Received from [facility], [facility]  Social Connections: Unknown (05/31/2023)  Stress: Stress Concern Present (01/04/2018)  Tobacco Use: Low Risk  (05/31/2023)   SDOH Interventions:     Readmission Risk Interventions     No data to display

## 2023-06-01 NOTE — Telephone Encounter (Signed)
 Patient Product/process development scientist completed.    The patient is insured through  [employer] . Patient has [employer], may use a copay card, and/or apply for patient assistance if available.    Ran test claim for Lantus Pen and the current 30 day co-pay is $93.49.  Ran test claim for insulin  lispro 100 unit/ml KwikPen and the current 30 day co-pay is $15.00.  Ran test claim for [employer] and Requires Prior Authorization  Ran test claim for [employer] and Requires Prior Authorization  This test claim was processed through [employer]- copay amounts may vary at other pharmacies due to [employer], or as the patient moves through the different stages of their insurance plan.     Morgan Arab, CPHT Pharmacy Technician III Certified Patient Advocate [facility] Pharmacy Patient Advocate Team Direct Number: [phone]  Fax: [phone]

## 2023-06-01 NOTE — Inpatient Diabetes Management (Signed)
 Inpatient Diabetes Program Recommendations  AACE/ADA: New Consensus Statement on Inpatient Glycemic Control (2015)  Target Ranges:  Prepandial:   less than 140 mg/dL      Peak postprandial:   less than 180 mg/dL (1-2 hours)      Critically ill patients:  140 - 180 mg/dL   Lab Results  Component Value Date   GLUCAP 327 (H) 06/01/2023   HGBA1C 14.1 (H) 05/31/2023    Review of Glycemic Control  Latest Reference Range & Units 05/31/23 18:17 05/31/23 20:35 05/31/23 22:18 06/01/23 05:20 06/01/23 07:49  Glucose-Capillary 70 - 99 mg/dL 811 (H) 914 (H) 782 (H) 316 (H) 327 (H)  (H): Data is abnormally high Diabetes history: Type 2 Dm Outpatient Diabetes medications: Metformin  500 mg BID Current orders for Inpatient glycemic control: Semglee 30 units every day, Novolog  0-15 units TID & HS  Inpatient Diabetes Program Recommendations:    Spoke with patient regarding outpatient diabetes management. Admits to polyuria, polydipsia and fatigue.  Reviewed patient's current A1c of 14.1%. Explained what a A1c is and what it measures. Also reviewed goal A1c with patient, importance of good glucose control @ home, and blood sugar goals. Reviewed patho of DM, basic survival skills, signs and symptoms of hypo vs hyper glycemia, impact of infection, interventions, vascular changes and commorbidities.  Patient has a meter at home and it was reading "high" when occasionally checked. Reviewed target goals and when to call Md. Discussed CGM and patient is interested. Reviewed application, risks and benefits and how to obtain additional sensors.  Admits to drinking sugary beverages. Reviewed alternatives, carb counting, importance of protein and importance of overall CHO mindfulness. Dietitian consult and outpatient education referral placed. LWWDM book, RpH and insulin  starter kit ordered as well. Secure chat sent to [facility] to determine benefits. Educated patient on insulin  pen use at home. Reviewed contents of  insulin  flexpen starter kit. Reviewed all steps if insulin  pen including attachment of needle, 2-unit air shot, dialing up dose, giving injection, removing needle, disposal of sharps, storage of unused insulin , disposal of insulin  etc. Patient able to provide successful return demonstration. Also reviewed troubleshooting with insulin  pen. MD to give patient Rxs for insulin  pens and insulin  pen needles. Will see again on 4/29.  Thanks, Marjo Sievert, MSN, RNC-OB Diabetes Coordinator [phone] (8a-5p)

## 2023-06-01 NOTE — Consult Note (Signed)
 Pharmacy Antibiotic Note  Pamela Rasmussen is a 43 y.o. female admitted on 05/31/2023 with cellulitis. Patient presented to the ED for an abscess in their lower back with worsening symptoms. They presented to the ED 1 - 2 days ago and were were treated with Bactrim  4/25 x 7 days. Pharmacy has been consulted for Cefepime and Vancomycin dosing.   Today, 06/01/2023 Scr 0.98>> 0.82 WBC 15.7 >> 12.8 Afebrile over last 24 hours No imaging   Plan: Day 2 of antibiotics Scr improved Will adjust Vancomycin to 1000 mg Q12h  Goal AUC 400-550. Expected AUC: 517 Expected Css min: 14.9 SCr used: 0.82  Weight used: IBW, Vd used: 0.5 (BMI 42) Patient is also on Cefepime 2g Q8H Continue to monitor renal function and follow culture results  Height: 5\' 4"  (162.6 cm) Weight: 112.9 kg (249 lb) IBW/kg (Calculated) : 54.7  Temp (24hrs), Avg:98.8 F (37.1 C), Min:98 F (36.7 C), Max:100 F (37.8 C)  Recent Labs  Lab 05/28/23 1908 05/31/23 0826 05/31/23 1037 05/31/23 1352 05/31/23 1411 06/01/23 0420  WBC 15.0* 15.7*  --   --   --  12.8*  CREATININE 0.96 0.98  --   --   --  0.82  LATICACIDVEN  --  2.3* 2.5* 2.1* 2.5*  --     Estimated Creatinine Clearance: 110.1 mL/min (by C-G formula based on SCr of 0.82 mg/dL).    Allergies  Allergen Reactions   Levaquin [Levofloxacin In D5w] Itching   Penicillins Rash    Has patient had a PCN reaction causing immediate rash, facial/tongue/throat swelling, SOB or lightheadedness with hypotension: No Has patient had a PCN reaction causing severe rash involving mucus membranes or skin necrosis: No Has patient had a PCN reaction that required hospitalization: No Has patient had a PCN reaction occurring within the last 10 years: No If all of the above answers are "NO", then may proceed with Cephalosporin use.    Antimicrobials this admission: 4/27 Ceftriaxone  1g x 1 4/27 Vancomycin  >>  4/27 Cefepime >>   Dose adjustments this  admission: NA  Microbiology results: 4/28: I&D of back abscess:   Thank you for allowing pharmacy to be a part of this patient's care.  Pamela Rasmussen A, PharmD 06/01/2023 1:13 PM

## 2023-06-01 NOTE — Progress Notes (Addendum)
 Progress Note    Pamela Rasmussen  ZOX:[id number] DOB: Jun 23, 1980  DOA: 05/31/2023 PCP: Mai Schwalbe, FNP      Brief Narrative:    Medical records reviewed and are as summarized below:  Pamela Rasmussen is a 43 y.o. female with medical history significant for type 2 diabetes mellitus, morbid obesity, bipolar 1 disorder, anxiety, vertigo, who presented to the hospital for a wound on the lower back on 05/31/2023.  She initially presented to the ED on 05/28/2023 for high blood glucose and abscess on her back.  She was discharged on metformin  and Bactrim  from the ED on 05/29/2023.  She returned to the emergency room on 05/31/2023 because the wound on her back was not getting better.  She was having more pain associated with the wound.  She is a known diabetic and used to take Ozempic.  However, she has not taken Ozempic for about a year because insurance no longer covers it.  She had taken Ozempic for almost 2 years prior to this.  She also has a prescription for metformin  but has not taken metformin  for over 6 months because she just does not like it.  She noticed that her glucometer reading was reading "high" in the last few days.  She took her fianc's insulin  (insulin  70/30) 50 units but glucose was still high.   Vital signs in the ED: Temperature 99.5, respiratory rate up to 24, heart rate up to 114 , BP 150/93, oxygen saturation 98% on room air. Her glucose was 481 on admission.  Lactic acid was up to 2.5.  WBC 12.8.   She was admitted to the hospital for severe sepsis secondary to abscess on the lower back.        Assessment/Plan:   Principal Problem:   Severe sepsis (HCC) Active Problems:   Abscess of lower back   T2DM (type 2 diabetes mellitus) (HCC)   Bipolar 1 disorder, mixed, moderate (HCC)   Pseudohyponatremia   Body mass index is 42.74 kg/m.  (Morbid obesity)-this complicates overall care and prognosis   Severe sepsis secondary to abscess on the  lower back: S/p I&D of abscess on lower back. Leukocytosis is improving. Discontinue IV cefepime.  Continue IV vancomycin.  Consulted Dr. Cornel Diesel, general surgeon. History of perianal abscess on CT abdomen pelvis in February 2024   Type II DM with hyperglycemia: Start Lantus 30 units daily and NovoLog  5 units 3 times daily with meals.  Use NovoLog  as needed for hyperglycemia.  Diabetes coordinator has been consulted. Hemoglobin A1c 14. The importance of medical adherence was reiterated.   Hyperglycemia-induced hyponatremia: Improving.     Vulvovaginal candidiasis, vaginal discharge: Treat with oral fluconazole    Bipolar 1 disorder: Continue lithium  and Depakote     Diet Order             Diet heart healthy/carb modified Room service appropriate? Yes; Fluid consistency: Thin  Diet effective now                            Consultants: General Surgeon  Procedures: None    Medications:    divalproex   500 mg Oral QHS   enoxaparin (LOVENOX) injection  55 mg Subcutaneous Q24H   fluconazole   150 mg Oral Once   insulin  aspart  0-15 Units Subcutaneous TID AC & HS   insulin  aspart  5 Units Subcutaneous TID WC   insulin  glargine-yfgn  30 Units Subcutaneous Daily  lithium  carbonate  300 mg Oral QPM   OLANZapine   10 mg Oral QHS   PARoxetine   40 mg Oral QHS   valACYclovir   500 mg Oral QHS   Continuous Infusions:  ceFEPime (MAXIPIME) IV Stopped (06/01/23 0615)   vancomycin       Anti-infectives (From admission, onward)    Start     Dose/Rate Route Frequency Ordered Stop   06/01/23 1400  vancomycin (VANCOREADY) IVPB 1750 mg/350 mL        1,750 mg 175 mL/hr over 120 Minutes Intravenous Every 24 hours 05/31/23 1603     06/01/23 1115  fluconazole  (DIFLUCAN ) tablet 150 mg        150 mg Oral  Once 06/01/23 1029     06/01/23 0000  vancomycin (VANCOREADY) IVPB 750 mg/150 mL  Status:  Discontinued        750 mg 150 mL/hr over 60 Minutes Intravenous Every 12 hours  05/31/23 1313 05/31/23 1603   05/31/23 2200  valACYclovir  (VALTREX ) tablet 500 mg        500 mg Oral Daily at bedtime 05/31/23 1209     05/31/23 1400  ceFEPIme (MAXIPIME) 2 g in sodium chloride  0.9 % 100 mL IVPB        2 g 200 mL/hr over 30 Minutes Intravenous Every 8 hours 05/31/23 1229     05/31/23 1030  Vancomycin (VANCOCIN) 1,250 mg in sodium chloride  0.9 % 250 mL IVPB       Placed in "Followed by" Linked Group   1,250 mg 166.7 mL/hr over 90 Minutes Intravenous  Once 05/31/23 0931 05/31/23 1530   05/31/23 1030  Vancomycin (VANCOCIN) 1,250 mg in sodium chloride  0.9 % 250 mL IVPB       Placed in "Followed by" Linked Group   1,250 mg 166.7 mL/hr over 90 Minutes Intravenous  Once 05/31/23 0931 05/31/23 1320   05/31/23 1000  vancomycin (VANCOCIN) 2,500 mg in sodium chloride  0.9 % 500 mL IVPB  Status:  Discontinued        2,500 mg 262.5 mL/hr over 120 Minutes Intravenous  Once 05/31/23 0924 05/31/23 0931   05/31/23 0915  cefTRIAXone  (ROCEPHIN ) 1 g in sodium chloride  0.9 % 100 mL IVPB        1 g 200 mL/hr over 30 Minutes Intravenous  Once 05/31/23 0910 05/31/23 1032              Family Communication/Anticipated D/C date and plan/Code Status   DVT prophylaxis:      Code Status: Full Code  Family Communication: None Disposition Plan: Plan to discharge home   Status is: Inpatient Remains inpatient appropriate because: Hyperglycemia, sepsis from abscess on the back       Subjective:   Interval events noted.  Pain from wound on the lower back was better.  Brooke, RN and a Child psychotherapist, were at the bedside  Objective:    Vitals:   05/31/23 2351 06/01/23 0521 06/01/23 0700 06/01/23 1135  BP: (!) 103/54 117/71 98/61 (!) 96/56  Pulse: 97 98 90 99  Resp: 18 18  18   Temp: 99.5 F (37.5 C) 98.4 F (36.9 C) 98 F (36.7 C) 98.3 F (36.8 C)  TempSrc:   Oral   SpO2: 96% 98% 96% 97%  Weight:      Height:       No data found.   Intake/Output Summary (Last 24  hours) at 06/01/2023 1214 Last data filed at 06/01/2023 0900 Gross per 24 hour  Intake 2756.94  ml  Output --  Net 2756.94 ml   Filed Weights   05/31/23 0824  Weight: 112.9 kg    Exam:  GEN: NAD SKIN: Warm and dry.  Wound on mid lower back packed with gauze with some purulent drainage. EYES: No pallor or icterus ENT: MMM CV: RRR PULM: CTA B ABD: soft, obese, NT, +BS CNS: AAO x 3, non focal EXT: No edema or tenderness RECTAL/GU: Show on inspection, no abscess or wound seen in the perianal area.  Noted to have creamy, curdy discharge on the vulva      Brooke, RN and a trainee nurse at the bedside as chaperones   Data Reviewed:   I have personally reviewed following labs and imaging studies:  Labs: Labs show the following:   Basic Metabolic Panel: Recent Labs  Lab 05/28/23 1908 05/31/23 0826 06/01/23 0420  NA 127* 129* 133*  K 3.8 3.4* 3.5  CL 93* 98 101  CO2 21* 22 26  GLUCOSE 392* 392* 280*  BUN 7 6 7   CREATININE 0.96 0.98 0.82  CALCIUM 9.2 9.0 8.5*   GFR Estimated Creatinine Clearance: 110.1 mL/min (by C-G formula based on SCr of 0.82 mg/dL). Liver Function Tests: Recent Labs  Lab 05/31/23 0826 06/01/23 0420  AST 26 17  ALT 55* 38  ALKPHOS 127* 103  BILITOT 0.5 0.6  PROT 7.8 6.4*  ALBUMIN 3.4* 2.7*   No results for input(s): "LIPASE", "AMYLASE" in the last 168 hours. No results for input(s): "AMMONIA" in the last 168 hours. Coagulation profile No results for input(s): "INR", "PROTIME" in the last 168 hours.  CBC: Recent Labs  Lab 05/28/23 1908 05/31/23 0826 06/01/23 0420  WBC 15.0* 15.7* 12.8*  NEUTROABS  --  11.6*  --   HGB 14.4 14.2 12.5  HCT 40.9 40.9 36.4  MCV 91.9 92.5 95.5  PLT 220 232 201   Cardiac Enzymes: No results for input(s): "CKTOTAL", "CKMB", "CKMBINDEX", "TROPONINI" in the last 168 hours. BNP (last 3 results) No results for input(s): "PROBNP" in the last 8760 hours. CBG: Recent Labs  Lab 05/31/23 2035  05/31/23 2218 06/01/23 0520 06/01/23 0749 06/01/23 1146  GLUCAP 285* 311* 316* 327* 392*   D-Dimer: No results for input(s): "DDIMER" in the last 72 hours. Hgb A1c: Recent Labs    05/31/23 0826  HGBA1C 14.1*   Lipid Profile: No results for input(s): "CHOL", "HDL", "LDLCALC", "TRIG", "CHOLHDL", "LDLDIRECT" in the last 72 hours. Thyroid  function studies: No results for input(s): "TSH", "T4TOTAL", "T3FREE", "THYROIDAB" in the last 72 hours.  Invalid input(s): "FREET3" Anemia work up: No results for input(s): "VITAMINB12", "FOLATE", "FERRITIN", "TIBC", "IRON", "RETICCTPCT" in the last 72 hours. Sepsis Labs: Recent Labs  Lab 05/28/23 1908 05/31/23 0826 05/31/23 1037 05/31/23 1352 05/31/23 1411 06/01/23 0420  WBC 15.0* 15.7*  --   --   --  12.8*  LATICACIDVEN  --  2.3* 2.5* 2.1* 2.5*  --     Microbiology No results found for this or any previous visit (from the past 240 hours).  Procedures and diagnostic studies:  No results found.             LOS: 0 days   Pamela Rasmussen  [facility]   Pager on [email or web address]. If 7PM-7AM, please contact night-coverage at [email or web address]     06/01/2023, 12:14 PM

## 2023-06-02 ENCOUNTER — Other Ambulatory Visit (HOSPITAL_COMMUNITY): Payer: Self-pay

## 2023-06-02 DIAGNOSIS — R6520 Severe sepsis without septic shock: Secondary | ICD-10-CM | POA: Diagnosis not present

## 2023-06-02 DIAGNOSIS — L02212 Cutaneous abscess of back [any part, except buttock]: Secondary | ICD-10-CM | POA: Diagnosis not present

## 2023-06-02 DIAGNOSIS — A419 Sepsis, unspecified organism: Secondary | ICD-10-CM | POA: Diagnosis not present

## 2023-06-02 DIAGNOSIS — L03312 Cellulitis of back [any part except buttock]: Secondary | ICD-10-CM | POA: Diagnosis not present

## 2023-06-02 DIAGNOSIS — E1165 Type 2 diabetes mellitus with hyperglycemia: Secondary | ICD-10-CM | POA: Diagnosis not present

## 2023-06-02 LAB — CBC WITH DIFFERENTIAL/PLATELET
Abs Immature Granulocytes: 0.17 10*3/uL — ABNORMAL HIGH (ref 0.00–0.07)
Basophils Absolute: 0.1 10*3/uL (ref 0.0–0.1)
Basophils Relative: 0 %
Eosinophils Absolute: 0.5 10*3/uL (ref 0.0–0.5)
Eosinophils Relative: 5 %
HCT: 36.4 % (ref 36.0–46.0)
Hemoglobin: 12.6 g/dL (ref 12.0–15.0)
Immature Granulocytes: 2 %
Lymphocytes Relative: 28 %
Lymphs Abs: 3.2 10*3/uL (ref 0.7–4.0)
MCH: 32.4 pg (ref 26.0–34.0)
MCHC: 34.6 g/dL (ref 30.0–36.0)
MCV: 93.6 fL (ref 80.0–100.0)
Monocytes Absolute: 0.9 10*3/uL (ref 0.1–1.0)
Monocytes Relative: 8 %
Neutro Abs: 6.5 10*3/uL (ref 1.7–7.7)
Neutrophils Relative %: 57 %
Platelets: 225 10*3/uL (ref 150–400)
RBC: 3.89 MIL/uL (ref 3.87–5.11)
RDW: 12.3 % (ref 11.5–15.5)
WBC: 11.3 10*3/uL — ABNORMAL HIGH (ref 4.0–10.5)
nRBC: 0 % (ref 0.0–0.2)

## 2023-06-02 LAB — BASIC METABOLIC PANEL WITH GFR
Anion gap: 5 (ref 5–15)
BUN: 8 mg/dL (ref 6–20)
CO2: 25 mmol/L (ref 22–32)
Calcium: 8.5 mg/dL — ABNORMAL LOW (ref 8.9–10.3)
Chloride: 104 mmol/L (ref 98–111)
Creatinine, Ser: 0.78 mg/dL (ref 0.44–1.00)
GFR, Estimated: >60 mL/min (ref >60)
Glucose, Bld: 280 mg/dL — ABNORMAL HIGH (ref 70–99)
Potassium: 3.5 mmol/L (ref 3.5–5.1)
Sodium: 134 mmol/L — ABNORMAL LOW (ref 135–145)

## 2023-06-02 LAB — LACTIC ACID, PLASMA: Lactic Acid, Venous: 1 mmol/L (ref 0.5–1.9)

## 2023-06-02 LAB — GLUCOSE, CAPILLARY
Glucose-Capillary: 385 mg/dL — ABNORMAL HIGH (ref 70–99)
Glucose-Capillary: 285 mg/dL — ABNORMAL HIGH (ref 70–99)

## 2023-06-02 MED ORDER — GLUCAGON EMERGENCY 1 MG IJ KIT: 1.0000 mg | PACK | INTRAMUSCULAR | 0 refills | Status: AC | PRN

## 2023-06-02 MED ORDER — PEN NEEDLES 31G X 5 MM MISC: 1.0000 | Freq: Three times a day (TID) | 0 refills | Status: AC

## 2023-06-02 MED ORDER — INSULIN ASPART 100 UNIT/ML IJ SOLN
10.0000 [IU] | Freq: Three times a day (TID) | INTRAMUSCULAR | Status: DC
  Administered 2023-06-02 (×2): 10 [IU] via SUBCUTANEOUS
  Filled 2023-06-02 (×2): qty 1

## 2023-06-02 MED ORDER — INSULIN ASPART 100 UNIT/ML FLEXPEN: 10.0000 [IU] | PEN_INJECTOR | Freq: Three times a day (TID) | SUBCUTANEOUS | 0 refills | Status: DC

## 2023-06-02 MED ORDER — DOXYCYCLINE HYCLATE 100 MG PO TABS: 100.0000 mg | ORAL_TABLET | Freq: Two times a day (BID) | ORAL | 0 refills | Status: AC

## 2023-06-02 MED ORDER — LITHIUM CARBONATE 300 MG PO TABS: 300.0000 mg | ORAL_TABLET | Freq: Every evening | ORAL | Status: DC

## 2023-06-02 MED ORDER — FREESTYLE LIBRE 3 SENSOR MISC: 0 refills | Status: AC

## 2023-06-02 MED ORDER — DOXYCYCLINE HYCLATE 100 MG PO TABS
100.0000 mg | ORAL_TABLET | Freq: Two times a day (BID) | ORAL | Status: DC
  Administered 2023-06-02: 100 mg via ORAL
  Filled 2023-06-02: qty 1

## 2023-06-02 MED ORDER — INSULIN ISOPHANE & REGULAR (HUMAN 70-30)100 UNIT/ML KWIKPEN: 28.0000 [IU] | PEN_INJECTOR | Freq: Two times a day (BID) | SUBCUTANEOUS | 0 refills | Status: DC

## 2023-06-02 MED ORDER — INSULIN GLARGINE 100 UNIT/ML SOLOSTAR PEN: 30.0000 [IU] | PEN_INJECTOR | Freq: Every day | SUBCUTANEOUS | 0 refills | Status: DC

## 2023-06-02 NOTE — Discharge Summary (Signed)
 Physician Discharge Summary   Patient: Pamela Rasmussen MRN: [id number] DOB: 10/13/1980  Admit date:     05/31/2023  Discharge date: 06/02/23  Discharge Physician: Sheril Dines   PCP: Mai Schwalbe, FNP   Recommendations at discharge:   Follow-up with PCP in 1 week  Discharge Diagnoses: Principal Problem:   Severe sepsis (HCC) Active Problems:   Abscess of lower back   T2DM (type 2 diabetes mellitus) (HCC)   Bipolar 1 disorder, mixed, moderate (HCC)   Pseudohyponatremia   Cellulitis of back except buttock  Resolved Problems:   * No resolved hospital problems. *  Hospital Course:  Pamela Rasmussen is a 43 y.o. female with medical history significant for type 2 diabetes mellitus, morbid obesity, bipolar 1 disorder, anxiety, vertigo, who presented to the hospital for a wound on the lower back on 05/31/2023.  She initially presented to the ED on 05/28/2023 for high blood glucose and abscess on her back.  She was discharged on metformin  and Bactrim  from the ED on 05/29/2023.   She returned to the emergency room on 05/31/2023 because the wound on her back was not getting better.  She was having more pain associated with the wound.   She is a known diabetic and used to take Ozempic.  However, she has not taken Ozempic for about a year because insurance no longer covers it.  She had taken Ozempic for almost 2 years prior to this.  She also has a prescription for metformin  but has not taken metformin  for over 6 months because she just does not like it.  She noticed that her glucometer reading was reading "high" in the last few days.  She took her fianc's insulin  (insulin  70/30) 50 units but glucose was still high.     Vital signs in the ED: Temperature 99.5, respiratory rate up to 24, heart rate up to 114 , BP 150/93, oxygen saturation 98% on room air. Her glucose was 481 on admission.  Lactic acid was up to 2.5.  WBC 12.8.     She was admitted to the hospital for severe sepsis  secondary to abscess on the lower back.     Assessment and Plan:   Severe sepsis secondary to abscess on the lower back: S/p I&D of abscess on lower back on 05/31/2023. Leukocytosis has improved S/p treatment with IV cefepime and vancomycin. He will be discharged on doxycycline for 8 more days to complete 10 days of treatment. Continue local wound care. History of perianal abscess on CT abdomen pelvis in February 2024     Type II DM with hyperglycemia: She be discharged on Novolin 70/30 28 units twice daily.  She prefers to stay on Lantus and NovoLog  because of cost.   Hemoglobin A1c 14. The importance of medical adherence was reiterated. Close for follow-up with PCP is recommended for further management of diabetes mellitus.  Discussed potential complications of suboptimal glucose control and complications of insulin .    Hyperglycemia-induced hyponatremia: Improved     Vulvovaginal candidiasis, vaginal discharge: S/p fluconazole  150 mg x 1 dose on 06/01/2023     Bipolar 1 disorder: Continue lithium  and Depakote    Morbid obesity: Weight loss and regular exercise recommended    Her condition has improved and she is deemed stable for discharge to home today.         Consultants: General Surgeon Procedures performed: S/p I&D of abscess on lower back on 05/31/2023.  Disposition: Home Diet recommendation:  Discharge Diet Orders (From admission,  onward)     Start     Ordered   06/02/23 0000  Diet - low sodium heart healthy        06/02/23 1254   06/02/23 0000  Diet Carb Modified        06/02/23 1254           Cardiac and Carb modified diet DISCHARGE MEDICATION: Allergies as of 06/02/2023       Reactions   Levaquin [levofloxacin In D5w] Itching   Penicillins Rash   Has patient had a PCN reaction causing immediate rash, facial/tongue/throat swelling, SOB or lightheadedness with hypotension: No Has patient had a PCN reaction causing severe rash involving mucus  membranes or skin necrosis: No Has patient had a PCN reaction that required hospitalization: No Has patient had a PCN reaction occurring within the last 10 years: No If all of the above answers are "NO", then may proceed with Cephalosporin use.        Medication List     STOP taking these medications    sulfamethoxazole -trimethoprim  800-160 MG tablet Commonly known as: BACTRIM  DS       TAKE these medications    divalproex  500 MG 24 hr tablet Commonly known as: DEPAKOTE  ER Take 1 tablet (500 mg total) by mouth at bedtime.   doxycycline 100 MG tablet Commonly known as: VIBRA-TABS Take 1 tablet (100 mg total) by mouth 2 (two) times daily for 8 days.   FreeStyle Libre 3 Sensor Misc Place 1 sensor on the skin every 14 days. Use to check glucose continuously   Glucagon Emergency 1 MG Kit Inject 1 mg into the skin as needed for up to 2 doses (Severe low blood sugar).   insulin  isophane & regular human KwikPen (70-30) 100 UNIT/ML KwikPen Commonly known as: HUMULIN 70/30 MIX Inject 28 Units into the skin 2 (two) times daily.   lithium  300 MG tablet Take 1 tablet (300 mg total) by mouth every evening.   metFORMIN  500 MG tablet Commonly known as: GLUCOPHAGE  Take 1 tablet (500 mg total) by mouth 2 (two) times daily with a meal.   OLANZapine  10 MG tablet Commonly known as: ZYPREXA  Take 10 mg by mouth at bedtime.   PARoxetine  40 MG tablet Commonly known as: PAXIL  Take 40 mg by mouth daily.   Pen Needles 31G X 5 MM Misc 1 each by Does not apply route 3 (three) times daily. May dispense any manufacturer covered by patient's insurance.   valACYclovir  500 MG tablet Commonly known as: Valtrex  Take 1 tablet (500 mg total) by mouth daily.               Discharge Care Instructions  (From admission, onward)           Start     Ordered   06/02/23 0000  Discharge wound care:       Comments: pack wound daily with dry gauze or packing if available, cover, secure.  Okay to remove to shower. She can continue this until Friday 05/01 at home.   06/02/23 1256            Follow-up Information     Mai Schwalbe, FNP Follow up.   Specialty: Nurse Practitioner Why: Patient making own follow up appt Contact information: [address] [place] [facility] [address] [phone]                Discharge Exam: Filed Weights   05/31/23 0824  Weight: 112.9 kg   GEN: NAD  SKIN: Warm and dry EYES: No pallor or icterus ENT: MMM CV: RRR PULM: CTA B ABD: soft, obese, NT, +BS CNS: AAO x 3, non focal EXT: No edema or tenderness   Condition at discharge: good  The results of significant diagnostics from this hospitalization (including imaging, microbiology, ancillary and laboratory) are listed below for reference.   Imaging Studies: No results found.  Microbiology: Results for orders placed or performed in visit on 04/30/23  WET PREP FOR TRICH, YEAST, CLUE     Status: None   Collection Time: 04/30/23 11:35 AM  Result Value Ref Range Status   Trichomonas Exam Negative Negative Final   Yeast Exam Few Negative Final   Clue Cell Exam Comment: Negative Final    Comment: Neg; amine neg. VH  Gonococcus culture     Status: None   Collection Time: 04/30/23 11:51 AM   Specimen: Rectum; Genital   AU  Result Value Ref Range Status   GC Culture Only Final report  Final   Result 1 Comment  Final    Comment: No Neisseria gonorrhoeae isolated.  Gonococcus culture     Status: None   Collection Time: 04/30/23 11:53 AM   Specimen: Pharynx; Throat   PH  Result Value Ref Range Status   GC Culture Only Final report  Final   Result 1 Comment  Final    Comment: No Neisseria gonorrhoeae isolated.    Labs: CBC: Recent Labs  Lab 05/28/23 1908 05/31/23 0826 06/01/23 0420 06/02/23 0357  WBC 15.0* 15.7* 12.8* 11.3*  NEUTROABS  --  11.6*  --  6.5  HGB 14.4 14.2 12.5 12.6  HCT 40.9 40.9 36.4 36.4  MCV 91.9 92.5 95.5 93.6  PLT 220 232 201 225   Basic  Metabolic Panel: Recent Labs  Lab 05/28/23 1908 05/31/23 0826 06/01/23 0420 06/02/23 0357  NA 127* 129* 133* 134*  K 3.8 3.4* 3.5 3.5  CL 93* 98 101 104  CO2 21* 22 26 25   GLUCOSE 392* 392* 280* 280*  BUN 7 6 7 8   CREATININE 0.96 0.98 0.82 0.78  CALCIUM 9.2 9.0 8.5* 8.5*   Liver Function Tests: Recent Labs  Lab 05/31/23 0826 06/01/23 0420  AST 26 17  ALT 55* 38  ALKPHOS 127* 103  BILITOT 0.5 0.6  PROT 7.8 6.4*  ALBUMIN 3.4* 2.7*   CBG: Recent Labs  Lab 06/01/23 1146 06/01/23 1526 06/01/23 2124 06/02/23 0858 06/02/23 1224  GLUCAP 392* 295* 260* 385* 285*    Discharge time spent: greater than 30 minutes.  Signed: Sheril Dines, MD [facility] 06/02/2023

## 2023-06-02 NOTE — Progress Notes (Signed)
 [facility] SURGICAL PROGRESS NOTE (cpt [phone])  Hospital Day(s): 1.   Interval History: Patient seen and examined, no acute events or new complaints overnight. Patient reports soreness is marked improved to low back. No fever, chills. Leukocytosis is improving; WBC 11.3K. Hgb to 12.6. Lactic acidosis resolved; 1.0. Glucose 280. Renal function normal; sCr - 0.78. Continues on Cefepime and Vancomycin   Review of Systems:  Constitutional: denies fever, chills  HEENT: denies cough or congestion  Respiratory: denies any shortness of breath  Cardiovascular: denies chest pain or palpitations  Genitourinary: denies burning with urination or urinary frequency Musculoskeletal: denies pain, decreased motor or sensation Integumentary: + low back cellulitis/abscess (improved)  Vital signs in last 24 hours: [min-max] current  Temp:  [97.1 F (36.2 C)-98.6 F (37 C)] 97.1 F (36.2 C) (04/29 0511) Pulse Rate:  [88-99] 88 (04/29 0511) Resp:  [17-20] 17 (04/29 0511) BP: (89-116)/(42-78) 116/73 (04/29 0511) SpO2:  [93 %-97 %] 93 % (04/29 0511)     Height: 5\' 4"  (162.6 cm) Weight: 112.9 kg BMI (Calculated): 42.72   Intake/Output last 2 shifts:  04/28 0701 - 04/29 0700 In: 1089.6 [P.O.:480; IV Piggyback:609.6] Out: -    Physical Exam:  Constitutional: alert, cooperative and no distress  HENT: normocephalic without obvious abnormality  Eyes: PERRL, EOM's grossly intact and symmetric  Respiratory: breathing non-labored at rest  Cardiovascular: regular rate and sinus rhythm  Integumentary: To the right lower back there is I&D site, packing in place, drainage more serous, erythema improving. No appreciable undrained collection   Labs:     Latest Ref Rng & Units 06/02/2023    3:57 AM 06/01/2023    4:20 AM 05/31/2023    8:26 AM  CBC  WBC 4.0 - 10.5 K/uL 11.3  12.8  15.7   Hemoglobin 12.0 - 15.0 g/dL 84.6  96.2  95.2   Hematocrit 36.0 - 46.0 % 36.4  36.4  40.9   Platelets 150 -  400 K/uL 225  201  232       Latest Ref Rng & Units 06/02/2023    3:57 AM 06/01/2023    4:20 AM 05/31/2023    8:26 AM  CMP  Glucose 70 - 99 mg/dL 841  324  401   BUN 6 - 20 mg/dL 8  7  6    Creatinine 0.44 - 1.00 mg/dL 0.27  2.53  6.64   Sodium 135 - 145 mmol/L 134  133  129   Potassium 3.5 - 5.1 mmol/L 3.5  3.5  3.4   Chloride 98 - 111 mmol/L 104  101  98   CO2 22 - 32 mmol/L 25  26  22    Calcium 8.9 - 10.3 mg/dL 8.5  8.5  9.0   Total Protein 6.5 - 8.1 g/dL  6.4  7.8   Total Bilirubin 0.0 - 1.2 mg/dL  0.6  0.5   Alkaline Phos 38 - 126 U/L  103  127   AST 15 - 41 U/L  17  26   ALT 0 - 44 U/L  38  55      Imaging studies: No new pertinent imaging studies   Assessment/Plan:  43 y.o. female with with lower back cellulitis with abscess s/p I&D by EDP, complicated by pertinent comorbidities including poorly controlled T2DM with HgbA1c 14.               - No need for additional procedures at this time.               -  Wound Care: pack wound daily with dry gauze or packing if available, cover, secure. Okay to remove to shower. She can continue this until Friday 05/01 at home.              - Continue IV Abx (Cefepime, Vancomycin); No Cx available - narrow for home              - Pain control prn             - Correct hyperglycemia              - Further management per primary service   - Discharge Planning; Nothing more from surgical perspective, we will remain available. Wound care as above. She needs to establish with PCP   All of the above findings and recommendations were discussed with the patient, and the medical team, and all of patient's questions were answered to her expressed satisfaction.   -- Apolonio Bay, PA-C [facility] 06/02/2023, 7:21 AM M-F: 7am - 4pm

## 2023-06-02 NOTE — Telephone Encounter (Signed)
 Pharmacy Patient Advocate Encounter  Received notification from  [facility]  that Prior Authorization for FreeStyle Libre 3 Plus Sensor  has been APPROVED from 06/01/2023 to 05/31/2024. Ran test claim, Copay is $100.00. This test claim was processed through [facility]- copay amounts may vary at other pharmacies due to pharmacy/plan contracts, or as the patient moves through the different stages of their insurance plan.   PA #/Case ID/Reference #: [id number]

## 2023-06-02 NOTE — Plan of Care (Signed)
 Nutrition Education Note   RD consulted for nutrition education regarding diabetes.   43 y/o female with h/o anxiety, depression, bipolar disorder and uncontrolled DM who is admitted with lower back cellulitis with abscess s/p I&D.   Met with pt in room today. Pt reports good appetite and oral intake at baseline. Pt reports that she has had diabetes for a couple of years. Pt reports that's he does not follow any type of diabetes diet at home.   Lab Results  Component Value Date   HGBA1C 14.1 (H) 05/31/2023    RD provided "Nutrition and Type II Diabetes" handout from the Academy of [employer] Dietetics. Discussed different food groups and their effects on blood sugar, emphasizing carbohydrate-containing foods. Provided list of carbohydrates and recommended serving sizes of common foods.  Discussed importance of controlled and consistent carbohydrate intake throughout the day. Provided examples of ways to balance meals/snacks and encouraged intake of high-fiber, whole grain complex carbohydrates. Teach back method used.  Expect good compliance.  Body mass index is 42.74 kg/m. Pt meets criteria for morbid obesity based on current BMI.  Current diet order is HH/CHO modified, patient is consuming approximately 100% of meals at this time. Labs and medications reviewed. No further nutrition interventions warranted at this time. RD contact information provided. If additional nutrition issues arise, please re-consult RD.  Torrance Freestone MS, RD, LDN If unable to be reached, please send secure chat to "RD inpatient" available from 8:00a-4:00p daily

## 2023-06-02 NOTE — Inpatient Diabetes Management (Addendum)
 Inpatient Diabetes Program Recommendations  AACE/ADA: New Consensus Statement on Inpatient Glycemic Control (2015)  Target Ranges:  Prepandial:   less than 140 mg/dL      Peak postprandial:   less than 180 mg/dL (1-2 hours)      Critically ill patients:  140 - 180 mg/dL   Lab Results  Component Value Date   GLUCAP 385 (H) 06/02/2023   HGBA1C 14.1 (H) 05/31/2023    Review of Glycemic Control  Latest Reference Range & Units 06/01/23 05:20 06/01/23 07:49 06/01/23 11:46 06/01/23 15:26 06/01/23 21:24 06/02/23 08:58  Glucose-Capillary 70 - 99 mg/dL 161 (H) 096 (H) 045 (H) 295 (H) 260 (H) 385 (H)  (H): Data is abnormally high Diabetes history: Type 2 Dm Outpatient Diabetes medications: Metformin  500 mg BID Current orders for Inpatient glycemic control: Semglee 30 units every day, Novolog  0-15 units TID, Novolog  10 units TID   Inpatient Diabetes Program Recommendations:    Consider also increasing Semglee to 36 units every day.  Addendum: Spoke with patient again to further reinforce concepts discussed yesterday.  Reviewed basic survival skills, interventions, cost of insulins, current insulin  needs, and CGM. Freestyle libre applied to patient's left upper arm. After discussing outpatient options, patient prefers the use of 70/30.  Secure chat sent to MD regarding difficulty with affordability. Patient prefers the use of Novolin 70/30. Recommending Novolin 70/30 28 units BID. MD to discuss with patient.  Thanks, Marjo Sievert, MSN, RNC-OB Diabetes Coordinator [phone] (8a-5p)

## 2023-06-02 NOTE — TOC Transition Note (Signed)
 Transition of Care [facility]) - Discharge Note   Patient Details  Name: Pamela Rasmussen MRN: [id number] Date of Birth: 1980/12/08  [facility]) CM/SW Contact:  Elsie Halo, RN Phone Number: 06/02/2023, 2:18 PM   Clinical Narrative:    Patient is medically clear to discharge to home. Her family will transport her. No TOC needs identified.   Final next level of care: Home/Self Care     Patient Goals and CMS Choice            Discharge Placement                       Discharge Plan and Services Additional resources added to the After Visit Summary for                                       Social Drivers of Health (SDOH) Interventions SDOH Screenings   Food Insecurity: No Food Insecurity (05/31/2023)  Housing: Low Risk  (05/31/2023)  Transportation Needs: No Transportation Needs (05/31/2023)  Utilities: Not At Risk (05/31/2023)  Depression (PHQ2-9): Low Risk  (07/11/2020)  Financial Resource Strain: Low Risk  (04/22/2022)   Received from [facility]  Physical Activity: Sufficiently Active (02/06/2021)   Received from [facility]  Social Connections: Unknown (05/31/2023)  Stress: Stress Concern Present (01/04/2018)  Tobacco Use: Low Risk  (05/31/2023)     Readmission Risk Interventions     No data to display

## 2023-06-04 ENCOUNTER — Telehealth: Payer: Self-pay | Admitting: *Deleted

## 2023-06-04 NOTE — Progress Notes (Unsigned)
 Care Guide Pharmacy Note  06/04/2023 Name: Pamela Rasmussen MRN: [id number] DOB: Nov 24, 1980  Referred By: Mai Schwalbe, FNP Reason for referral: No chief complaint on file.   Pamela Rasmussen is a 42 y.o. year old female who is a primary care patient of Mai Schwalbe, FNP.  Pamela Rasmussen was referred to the pharmacist for assistance related to: DMII  An unsuccessful telephone outreach was attempted today to contact the patient who was referred to the pharmacy team for assistance with medication management. Additional attempts will be made to contact the patient.  Pamela Rasmussen  [facility]  Value-Based Care Institute, [facility] Guide  Direct Dial: [phone]  Fax [phone]

## 2023-06-05 NOTE — Progress Notes (Unsigned)
 Care Guide Pharmacy Note  06/05/2023 Name: Pamela Rasmussen MRN: [id number] DOB: [birth year]  Referred By: Mai Schwalbe, FNP Reason for referral: Complex Care Management (Initial outreach to schedule referral with PharmD [facility] [employer] [employer] )   Pamela Rasmussen is a 43 y.o. year old female who is a primary care patient of Mai Schwalbe, FNP.  Pamela Rasmussen was referred to the pharmacist for assistance related to: DMII  A second unsuccessful telephone outreach was attempted today to contact the patient who was referred to the pharmacy team for assistance with medication management. Additional attempts will be made to contact the patient.  Pamela Rasmussen  [facility]  Value-Based Care Institute, [facility] Guide  Direct Dial: [phone]  Fax [phone]

## 2023-06-08 NOTE — Progress Notes (Signed)
 Care Guide Pharmacy Note  06/08/2023 Name: Kymber Smilowitz MRN: [id number] DOB: [birth year]  Referred By: Mai Schwalbe, FNP Reason for referral: Complex Care Management (Initial outreach to schedule referral with PharmD [facility] [employer] )   Emerii Escandon Roadcap is a 43 y.o. year old female who is a primary care patient of Mai Schwalbe, FNP.  Maryclaire Draves Buehrle was referred to the pharmacist for assistance related to: DMII  A third unsuccessful telephone outreach was attempted today to contact the patient who was referred to the pharmacy team for assistance with medication management. The Population Health team is pleased to engage with this patient at any time in the future upon receipt of referral and should he/she be interested in assistance from the Population Health team.  Barnie Bora  [facility] Health  Value-Based Care Institute, [facility] Guide  Direct Dial: [phone]  Fax [phone]

## 2023-08-10 ENCOUNTER — Other Ambulatory Visit

## 2023-08-12 ENCOUNTER — Other Ambulatory Visit

## 2023-08-17 ENCOUNTER — Ambulatory Visit

## 2023-08-26 ENCOUNTER — Emergency Department
Admission: EM | Admit: 2023-08-26 | Discharge: 2023-08-26 | Disposition: A | Discharge status: Home / Self Care | Attending: Emergency Medicine | Admitting: Emergency Medicine

## 2023-08-26 ENCOUNTER — Ambulatory Visit

## 2023-08-26 DIAGNOSIS — L0291 Cutaneous abscess, unspecified: Secondary | ICD-10-CM

## 2023-08-26 DIAGNOSIS — N764 Abscess of vulva: Secondary | ICD-10-CM | POA: Insufficient documentation

## 2023-08-26 DIAGNOSIS — E1165 Type 2 diabetes mellitus with hyperglycemia: Secondary | ICD-10-CM | POA: Diagnosis not present

## 2023-08-26 DIAGNOSIS — R739 Hyperglycemia, unspecified: Secondary | ICD-10-CM

## 2023-08-26 LAB — CBC WITH DIFFERENTIAL/PLATELET
Abs Immature Granulocytes: 0.06 K/uL (ref 0.00–0.07)
Basophils Absolute: 0.1 K/uL (ref 0.0–0.1)
Basophils Relative: 1 %
Eosinophils Absolute: 0.4 K/uL (ref 0.0–0.5)
Eosinophils Relative: 3 %
HCT: 39.7 % (ref 36.0–46.0)
Hemoglobin: 13.5 g/dL (ref 12.0–15.0)
Immature Granulocytes: 1 %
Lymphocytes Relative: 24 %
Lymphs Abs: 2.5 K/uL (ref 0.7–4.0)
MCH: 31.7 pg (ref 26.0–34.0)
MCHC: 34 g/dL (ref 30.0–36.0)
MCV: 93.2 fL (ref 80.0–100.0)
Monocytes Absolute: 1 K/uL (ref 0.1–1.0)
Monocytes Relative: 10 %
Neutro Abs: 6.6 K/uL (ref 1.7–7.7)
Neutrophils Relative %: 61 %
Platelets: 208 K/uL (ref 150–400)
RBC: 4.26 MIL/uL (ref 3.87–5.11)
RDW: 12.5 % (ref 11.5–15.5)
WBC: 10.6 K/uL — ABNORMAL HIGH (ref 4.0–10.5)
nRBC: 0 % (ref 0.0–0.2)

## 2023-08-26 LAB — COMPREHENSIVE METABOLIC PANEL WITH GFR
ALT: 51 U/L — ABNORMAL HIGH (ref 0–44)
AST: 44 U/L — ABNORMAL HIGH (ref 15–41)
Albumin: 3.3 g/dL — ABNORMAL LOW (ref 3.5–5.0)
Alkaline Phosphatase: 91 U/L (ref 38–126)
Anion gap: 15 (ref 5–15)
BUN: 7 mg/dL (ref 6–20)
CO2: 21 mmol/L — ABNORMAL LOW (ref 22–32)
Calcium: 8.8 mg/dL — ABNORMAL LOW (ref 8.9–10.3)
Chloride: 99 mmol/L (ref 98–111)
Creatinine, Ser: 0.91 mg/dL (ref 0.44–1.00)
GFR, Estimated: >60 mL/min (ref >60)
Glucose, Bld: 400 mg/dL — ABNORMAL HIGH (ref 70–99)
Potassium: 3.5 mmol/L (ref 3.5–5.1)
Sodium: 135 mmol/L (ref 135–145)
Total Bilirubin: 0.8 mg/dL (ref 0.0–1.2)
Total Protein: 6.7 g/dL (ref 6.5–8.1)

## 2023-08-26 LAB — LITHIUM LEVEL: Lithium Lvl: <0.06 mmol/L — ABNORMAL LOW (ref 0.60–1.20)

## 2023-08-26 LAB — VALPROIC ACID LEVEL: Valproic Acid Lvl: 14 ug/mL — ABNORMAL LOW (ref 50–100)

## 2023-08-26 LAB — POC URINE PREG, ED: Preg Test, Ur: NEGATIVE

## 2023-08-26 MED ORDER — CEFDINIR 300 MG PO CAPS: 300.0000 mg | ORAL_CAPSULE | Freq: Two times a day (BID) | ORAL | 0 refills | Status: AC

## 2023-08-26 MED ORDER — FLUCONAZOLE 150 MG PO TABS: 150.0000 mg | ORAL_TABLET | Freq: Every day | ORAL | 0 refills | Status: AC

## 2023-08-26 MED ORDER — LIDOCAINE-EPINEPHRINE 2 %-1:100000 IJ SOLN
10.0000 mL | Freq: Once | INTRAMUSCULAR | Status: AC
  Administered 2023-08-26: 10 mL

## 2023-08-26 MED ORDER — CLINDAMYCIN HCL 300 MG PO CAPS: 300.0000 mg | ORAL_CAPSULE | Freq: Three times a day (TID) | ORAL | 0 refills | Status: AC

## 2023-08-26 NOTE — Discharge Instructions (Addendum)
 Stop the antibiotics that you are prescribed and start these antibiotics.  These should cover everything and you can take the dose of fluconazole  after antibiotics are complete for yeast infection  Return to the ER if you develop worsening symptoms, fevers, redness spreading or any other concerns  Please follow-up with your primary care doctor to reevaluate the area and remove your packing in 1 day.

## 2023-08-26 NOTE — ED Provider Notes (Addendum)
 [facility] Provider Note    Event Date/Time   First MD Initiated Contact with Patient 08/26/23 [phone]     (approximate)   History   Abscess   HPI  Pamela Rasmussen is a 43 y.o. female with history of bipolar, diabetes, comes in with concerns for abscess.  She reports difficulties controlling her sugars that they are trying to do outpatient.  She reports having a  buttock abscess that she had to be admitted for previously.  And she had another abscess on her before that she is able to have I&D and discharged home.  I reviewed the note where patient was admitted on 05/31/2023 where patient was admitted for fevers, tachycardia, cellulitis.  She had been seen 2 days prior on 4/25 and had been started on antibiotics with Bactrim , metformin  but given things got worse she had to represent.  Patient reports being on antibiotic for UTI, yeast infection.  She is not sure the antibiotic name.  She reports that 3 days ago she noticed a little bit of pain on her right labia.  She reports that warm showers helps it feel better.  She presents today for this.  She denies any fevers.  She is not sure what her sugars are today but does report that she has been on insulin  and this has been helping.  H/o bipolar but denies any SI  Physical Exam   Triage Vital Signs: ED Triage Vitals  Encounter Vitals Group     BP 08/26/23 0722 132/87     Girls Systolic BP Percentile --      Girls Diastolic BP Percentile --      Boys Systolic BP Percentile --      Boys Diastolic BP Percentile --      Pulse Rate 08/26/23 0722 (!) 102     Resp 08/26/23 0722 18     Temp 08/26/23 0722 98.2 F (36.8 C)     Temp Source 08/26/23 0722 Oral     SpO2 08/26/23 0722 99 %     Weight 08/26/23 0723 249 lb (112.9 kg)     Height 08/26/23 0723 5' 4 (1.626 m)     Head Circumference --      Peak Flow --      Pain Score 08/26/23 0723 9     Pain Loc --      Pain Education --      Exclude from Growth  Chart --     Most recent vital signs: Vitals:   08/26/23 0722  BP: 132/87  Pulse: (!) 102  Resp: 18  Temp: 98.2 F (36.8 C)  SpO2: 99%     General: Awake, no distress.  CV:  Good peripheral perfusion.  Resp:  Normal effort.  Abd:  No distention.  Soft and nontender Other:  Patient has superficial quarter sized abscess with purulent drainage already coming out of the right labia.  There is no extension down into the side of the labia no palpation of the abscess inside the vaginal vault.  No tenderness on this area.  She has got some surrounding erythema going up suprapubically.   ED Results / Procedures / Treatments   Labs (all labs ordered are listed, but only abnormal results are displayed) Labs Reviewed  CBC WITH DIFFERENTIAL/PLATELET  COMPREHENSIVE METABOLIC PANEL WITH GFR     PROCEDURES:  Critical Care performed: No  .Incision and Drainage  Date/Time: 08/26/2023 9:33 AM  Performed by: Ernest Ronal BRAVO, MD Authorized by:  Ernest Ronal BRAVO, MD   Consent:    Consent obtained:  Verbal   Consent given by:  Patient   Risks discussed:  Bleeding, incomplete drainage, pain, infection and damage to other organs   Alternatives discussed:  No treatment Universal protocol:    Patient identity confirmed:  Verbally with patient Location:    Type:  Abscess   Size:  2x2   Location:  Anogenital   Anogenital location:  Vulva Pre-procedure details:    Skin preparation:  Antiseptic wash Sedation:    Sedation type:  None Anesthesia:    Anesthesia method:  Local infiltration   Local anesthetic:  Lidocaine  1% WITH epi Procedure type:    Complexity:  Simple Procedure details:    Ultrasound guidance: yes     Incision types:  Stab incision   Drainage:  Purulent and bloody   Packing materials:  1/4 in iodoform gauze   Amount 1/4 iodoform:  2 in Post-procedure details:    Procedure completion:  Tolerated well, no immediate complications    MEDICATIONS ORDERED IN  ED: Medications  lidocaine -EPINEPHrine  (XYLOCAINE  W/EPI) 2 %-1:100000 (with pres) injection 10 mL (10 mLs Other Given by Other 08/26/23 9166)     IMPRESSION / MDM / ASSESSMENT AND PLAN / ED COURSE  I reviewed the triage vital signs and the nursing notes.   Patient's presentation is most consistent with acute presentation with potential threat to life or bodily function.   Patient comes in tachycardic with concerns for right labial abscess, cellulitis.  It is already draining but we discussed I&D to fully open it up and irrigated it.  Does look small and superficial in nature at this time.  I considered CT or ultrasound imaging but given there is already head to it where it spontaneously draining it looks superficial I think we can do a bedside I&D.  Depending on size we will maybe try to attempt packing and she expressed understanding.  She understands her risk for pain, any recurrent procedures, scarring, infection but she is willing to proceed with I&D.  I did do a bedside ultrasound of the did show that it was superficial in nature  Given her slightly tachycardic and history of sepsis we discussed blood work to evaluate her sugars, white count and she did want to proceed with blood work.  She is on lithium  and valproic so we will get levels for these as well to ensure these are not contributing patient's exam is not consistent with Bartholin gland abscess.  Patient is hyperglycemic suspect related to the infection.  She did take her insulin  this morning.  Patient states that she can go up on her insulin  this afternoon as she takes it twice a day.  She has got no symptoms of DKA no evidence of DKA on examination.  She has a way to monitor her sugars at home.  We discussed admission to the hospital for monitoring of her sugars, IV antibiotics but at this time patient is not septic her heart rates are down to the 80s she really denies any symptoms.  She prefers to trial outpatient antibiotics although  she knows she is high risk for return given she has had this happen previously.  Patient instructed to discontinue her Macrobid .  She will move packing tomorrow.  Start patient on Bactrim   Given patient also was being treated for UTI, BV based upon the antibiotic she showed me I will switch her to cefdinir  as this will help cover UTI as well as  a skin infection and clindamycin  as this would help cover beefy but also cover better for the abscess.  She will discontinue her antibiotics and start these instead.  She expressed understanding and felt comfortable with this plan.     FINAL CLINICAL IMPRESSION(S) / ED DIAGNOSES   Final diagnoses:  Hyperglycemia  Abscess     Rx / DC Orders   ED Discharge Orders          Ordered    cefdinir  (OMNICEF ) 300 MG capsule  2 times daily        08/26/23 0939    clindamycin  (CLEOCIN ) 300 MG capsule  3 times daily        08/26/23 9060             Note:  This document was prepared using Dragon voice recognition software and may include unintentional dictation errors.   Ernest Ronal BRAVO, MD 08/26/23 9060    Ernest Ronal BRAVO, MD 08/26/23 [phone]

## 2023-08-26 NOTE — ED Triage Notes (Addendum)
 Pt c/o abscess/cyst in perineal area x 3 days.  Pain score 9/10.  Pt reports some relief w/ warm showers.  Denies drainage.    Pt reports she was diagnosed and started on antibiotics for a UTI and yeast infection x4 days ago.

## 2023-08-28 ENCOUNTER — Other Ambulatory Visit: Payer: Self-pay

## 2023-08-28 ENCOUNTER — Emergency Department
Admission: EM | Admit: 2023-08-28 | Discharge: 2023-08-29 | Disposition: A | Discharge status: Home / Self Care | Attending: Emergency Medicine | Admitting: Emergency Medicine

## 2023-08-28 DIAGNOSIS — R4586 Emotional lability: Secondary | ICD-10-CM | POA: Diagnosis not present

## 2023-08-28 DIAGNOSIS — F22 Delusional disorders: Secondary | ICD-10-CM | POA: Insufficient documentation

## 2023-08-28 DIAGNOSIS — F4312 Post-traumatic stress disorder, chronic: Secondary | ICD-10-CM

## 2023-08-28 DIAGNOSIS — Z79899 Other long term (current) drug therapy: Secondary | ICD-10-CM | POA: Diagnosis not present

## 2023-08-28 DIAGNOSIS — F4010 Social phobia, unspecified: Secondary | ICD-10-CM

## 2023-08-28 DIAGNOSIS — F3162 Bipolar disorder, current episode mixed, moderate: Secondary | ICD-10-CM | POA: Diagnosis present

## 2023-08-28 DIAGNOSIS — F3178 Bipolar disorder, in full remission, most recent episode mixed: Secondary | ICD-10-CM

## 2023-08-28 LAB — URINE DRUG SCREEN, QUALITATIVE (ARMC ONLY)
Amphetamines, Ur Screen: NOT DETECTED
Barbiturates, Ur Screen: NOT DETECTED
Benzodiazepine, Ur Scrn: NOT DETECTED
Cannabinoid 50 Ng, Ur ~~LOC~~: NOT DETECTED
Cocaine Metabolite,Ur ~~LOC~~: NOT DETECTED
MDMA (Ecstasy)Ur Screen: NOT DETECTED
Methadone Scn, Ur: NOT DETECTED
Opiate, Ur Screen: NOT DETECTED
Phencyclidine (PCP) Ur S: NOT DETECTED
Tricyclic, Ur Screen: NOT DETECTED

## 2023-08-28 LAB — COMPREHENSIVE METABOLIC PANEL WITH GFR
ALT: 52 U/L — ABNORMAL HIGH (ref 0–44)
AST: 38 U/L (ref 15–41)
Albumin: 3.7 g/dL (ref 3.5–5.0)
Alkaline Phosphatase: 98 U/L (ref 38–126)
Anion gap: 14 (ref 5–15)
BUN: 6 mg/dL (ref 6–20)
CO2: 23 mmol/L (ref 22–32)
Calcium: 9.3 mg/dL (ref 8.9–10.3)
Chloride: 100 mmol/L (ref 98–111)
Creatinine, Ser: 0.85 mg/dL (ref 0.44–1.00)
GFR, Estimated: >60 mL/min (ref >60)
Glucose, Bld: 330 mg/dL — ABNORMAL HIGH (ref 70–99)
Potassium: 3.5 mmol/L (ref 3.5–5.1)
Sodium: 137 mmol/L (ref 135–145)
Total Bilirubin: 0.6 mg/dL (ref 0.0–1.2)
Total Protein: 7.5 g/dL (ref 6.5–8.1)

## 2023-08-28 LAB — CBC
HCT: 42.6 % (ref 36.0–46.0)
Hemoglobin: 14.9 g/dL (ref 12.0–15.0)
MCH: 32.2 pg (ref 26.0–34.0)
MCHC: 35 g/dL (ref 30.0–36.0)
MCV: 92 fL (ref 80.0–100.0)
Platelets: 255 K/uL (ref 150–400)
RBC: 4.63 MIL/uL (ref 3.87–5.11)
RDW: 12.5 % (ref 11.5–15.5)
WBC: 13 K/uL — ABNORMAL HIGH (ref 4.0–10.5)
nRBC: 0 % (ref 0.0–0.2)

## 2023-08-28 LAB — POC URINE PREG, ED: Preg Test, Ur: NEGATIVE

## 2023-08-28 LAB — CBG MONITORING, ED: Glucose-Capillary: 306 mg/dL — ABNORMAL HIGH (ref 70–99)

## 2023-08-28 LAB — ETHANOL: Alcohol, Ethyl (B): <15 mg/dL (ref <15)

## 2023-08-28 NOTE — ED Provider Notes (Signed)
 [facility] Provider Note    Event Date/Time   First MD Initiated Contact with Patient 08/28/23 1637     (approximate)   History   Mental Health Problem   HPI  Pamela Rasmussen is a 43 year old female with history of bipolar disorder presenting to the emergency department for evaluation of mental health concerns.  Patient reports that she has been trying to arrange outpatient follow-up for her bipolar disorder, but [facility] no longer accepts her insurance and has been struggling to do so.  She has been taking her medications as directed, but is concerned that they are not as effective.  Specifically she has noticed some increased paranoid thoughts and some emotional lability with action such as striking her head, but denies SI or HI.  Denies AVH.     Physical Exam   Triage Vital Signs: ED Triage Vitals [08/28/23 1529]  Encounter Vitals Group     BP (!) 153/93     Girls Systolic BP Percentile      Girls Diastolic BP Percentile      Boys Systolic BP Percentile      Boys Diastolic BP Percentile      Pulse Rate (!) 103     Resp 16     Temp 98 F (36.7 C)     Temp Source Oral     SpO2 99 %     Weight 248 lb 14.4 oz (112.9 kg)     Height      Head Circumference      Peak Flow      Pain Score 0     Pain Loc      Pain Education      Exclude from Growth Chart     Most recent vital signs: Vitals:   08/28/23 1529  BP: (!) 153/93  Pulse: (!) 103  Resp: 16  Temp: 98 F (36.7 C)  SpO2: 99%     General: Awake, interactive  CV:  Good peripheral perfusion.  Resp:  Unlabored respirations.  Abd:  Nondistended.  Neuro:  Symmetric facial movement, fluid speech   ED Results / Procedures / Treatments   Labs (all labs ordered are listed, but only abnormal results are displayed) Labs Reviewed  COMPREHENSIVE METABOLIC PANEL WITH GFR - Abnormal; Notable for the following components:      Result Value   Glucose, Bld 330 (*)    ALT 52 (*)    All  other components within normal limits  CBC - Abnormal; Notable for the following components:   WBC 13.0 (*)    All other components within normal limits  CBG MONITORING, ED - Abnormal; Notable for the following components:   Glucose-Capillary 306 (*)    All other components within normal limits  ETHANOL  URINE DRUG SCREEN, QUALITATIVE ([facility] ONLY)  POC URINE PREG, ED     EKG EKG independently reviewed and interpreted by myself demonstrates:    RADIOLOGY Imaging independently reviewed and interpreted by myself demonstrates:   Formal Radiology Read:  No results found.  PROCEDURES:  Critical Care performed: No  Procedures   MEDICATIONS ORDERED IN ED: Medications - No data to display   IMPRESSION / MDM / ASSESSMENT AND PLAN / ED COURSE   I reviewed the triage vital signs and the nursing notes.  Differential diagnosis includes, but is not limited to, primary psychiatric disorder, substance-induced mood disorder, acute stress response  Patient's presentation is most consistent with acute presentation with potential threat to life  or bodily function.  43 year old female presenting with worsening mood symptoms despite taking medications for known bipolar disorder.  No reported SI, HI, AVH.  No indication for IVC.  Will consult psychiatry and TTS for further assistance.  The patient has been placed in psychiatric observation due to the need to provide a safe environment for the patient while obtaining psychiatric consultation and evaluation, as well as ongoing medical and medication management to treat the patient's condition.  The patient has not been placed under full IVC at this time.      FINAL CLINICAL IMPRESSION(S) / ED DIAGNOSES   Final diagnoses:  Paranoia (HCC)  Labile mood     Rx / DC Orders   ED Discharge Orders     None        Note:  This document was prepared using Dragon voice recognition software and may include unintentional dictation errors.    Levander Slate, MD 08/29/23 [phone]

## 2023-08-28 NOTE — ED Triage Notes (Signed)
 Sitting in Triage middle for safety.

## 2023-08-28 NOTE — ED Triage Notes (Signed)
 ARrives tearful. STates needs to be seen for mental health.  STates hx Bipolar and feels that her current medications are not correct.  Has tried to be seen outpatient, but 'no one will take insurance'. Intermittent c/o SI

## 2023-08-28 NOTE — ED Notes (Signed)
 Snacks provided to pt

## 2023-08-28 NOTE — ED Notes (Addendum)
 Pt belongings Pair of blue shoes Black tee shirt Black sports bra [place] Black leggins Cell phone Debit card White metal necklace with dolphin charm

## 2023-08-28 NOTE — BH Assessment (Addendum)
 TTS communicated with [facility] for consult via phone call and secure chat.
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
## 2023-08-28 NOTE — ED Notes (Signed)
Vol pending consult 

## 2023-08-28 NOTE — ED Notes (Addendum)
 Pt calm, cooperative and tearful requesting to either have doctors revisit her diagnosis, or adjust her medication dosage. Pt states that she isn't feeling right on the medication, and came today to get help. PT also states that she doesn't wish to stay inpatient.

## 2023-08-29 ENCOUNTER — Encounter: Payer: Self-pay | Admitting: Psychiatry

## 2023-08-29 DIAGNOSIS — F3162 Bipolar disorder, current episode mixed, moderate: Secondary | ICD-10-CM | POA: Diagnosis not present

## 2023-08-29 LAB — AEROBIC CULTURE W GRAM STAIN (SUPERFICIAL SPECIMEN)

## 2023-08-29 MED ORDER — DIVALPROEX SODIUM ER 500 MG PO TB24
500.0000 mg | ORAL_TABLET | Freq: Every day | ORAL | 0 refills | Status: DC
Start: 2023-08-29 — End: 2023-09-14

## 2023-08-29 MED ORDER — OLANZAPINE 10 MG PO TABS: 10.0000 mg | ORAL_TABLET | Freq: Every day | ORAL | 0 refills | Status: DC

## 2023-08-29 MED ORDER — PAROXETINE HCL 10 MG PO TABS: 10.0000 mg | ORAL_TABLET | Freq: Every day | ORAL | 0 refills | Status: DC

## 2023-08-29 MED ORDER — LITHIUM CARBONATE 300 MG PO TABS: 450.0000 mg | ORAL_TABLET | Freq: Every evening | ORAL | 0 refills | Status: DC

## 2023-08-29 NOTE — Discharge Instructions (Signed)
You have been seen in the Emergency Department (ED) today for a psychiatric complaint.  You have been evaluated by psychiatry and we believe you are safe to be discharged from the hospital.   ° °Please return to the ED immediately if you have ANY thoughts of hurting yourself or anyone else, so that we may help you. ° °Please avoid alcohol and drug use. ° °Follow up with your doctor and/or therapist as soon as possible regarding today's ED visit.   Please follow up any other recommendations and clinic appointments provided by the psychiatry team that saw you in the Emergency Department. ° °

## 2023-08-29 NOTE — ED Notes (Signed)
VOL  pending  consult 

## 2023-08-29 NOTE — ED Notes (Signed)
Pt provided with breakfast tray and beverage
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
## 2023-08-29 NOTE — ED Provider Notes (Signed)
-----------------------------------------   6:31 AM on 08/29/2023 -----------------------------------------  I consulted with a telepsychiatry provider NP Georgina who advises as follows:  she is cleared psychiatrically for discharge; she needs a bridge prescriptions and a referral for o/p psychiatric medication prescriber; recommend increase her lithobid  450mg  po at bedtime for mood; agree with continue of her other current med, I will document dosages in my note thank you for this consult  I have asked the RN to have pharmacy reconcile the patient's medication to make sure that we have an accurate list prior to providing any outpatient prescriptions.   Jacolyn Pae, MD 08/29/23 [phone]

## 2023-08-29 NOTE — ED Notes (Signed)
 This tech offered pt a shower and pt refused.

## 2023-08-29 NOTE — ED Provider Notes (Signed)
 Vitals:   08/28/23 2202 08/29/23 0733  BP: (!) 154/90 126/81  Pulse: 95 94  Resp: 16 18  Temp: 98.2 F (36.8 C) 98.6 F (37 C)  SpO2: 99% 96%     Seen and evaluated the patient.  She is resting comfortably seated fully alert oriented very pleasant in the day room.  She has advised that she has an appointment with her primary care doctor this Friday.  In the interim she does need prescriptions for her psychiatric medications.  I prescribed as advised by psychiatry nurse practitioner who evaluated the patient yesterday.  Patient agreeable with plan  Return precautions and treatment recommendations and follow-up discussed with the patient who is agreeable with the plan.    Dicky Anes, MD 08/29/23 1021

## 2023-08-29 NOTE — ED Notes (Signed)
 Pt back from interview. Pt ABCs intact. RR even and unlabored. Pt in NAD.

## 2023-08-29 NOTE — ED Notes (Signed)
 RN attempting to contact Pharmacy Tech to reconcile pt medication list.

## 2023-08-29 NOTE — ED Notes (Signed)
 Pt escorted to Interview room with Ronal, ED Tech and [employer]. RN unable to get adequate signal connection on telepsych cart.

## 2023-09-01 ENCOUNTER — Ambulatory Visit

## 2023-09-14 ENCOUNTER — Ambulatory Visit (INDEPENDENT_AMBULATORY_CARE_PROVIDER_SITE_OTHER)

## 2023-09-14 ENCOUNTER — Ambulatory Visit (LOCAL_COMMUNITY_HEALTH_CENTER)

## 2023-09-14 VITALS — HR 98 | Ht 64.0 in | Wt 243.0 lb

## 2023-09-14 DIAGNOSIS — Z794 Long term (current) use of insulin: Secondary | ICD-10-CM | POA: Diagnosis not present

## 2023-09-14 DIAGNOSIS — F319 Bipolar disorder, unspecified: Secondary | ICD-10-CM | POA: Diagnosis not present

## 2023-09-14 DIAGNOSIS — F4312 Post-traumatic stress disorder, chronic: Secondary | ICD-10-CM | POA: Diagnosis not present

## 2023-09-14 DIAGNOSIS — F4010 Social phobia, unspecified: Secondary | ICD-10-CM

## 2023-09-14 DIAGNOSIS — Z111 Encounter for screening for respiratory tuberculosis: Secondary | ICD-10-CM

## 2023-09-14 DIAGNOSIS — F3178 Bipolar disorder, in full remission, most recent episode mixed: Secondary | ICD-10-CM

## 2023-09-14 DIAGNOSIS — E1169 Type 2 diabetes mellitus with other specified complication: Secondary | ICD-10-CM

## 2023-09-14 MED ORDER — LITHIUM CARBONATE 300 MG PO TABS: 450.0000 mg | ORAL_TABLET | Freq: Every evening | ORAL | 0 refills | Status: DC

## 2023-09-14 MED ORDER — PAROXETINE HCL 10 MG PO TABS: 10.0000 mg | ORAL_TABLET | Freq: Every day | ORAL | 0 refills | Status: DC

## 2023-09-14 MED ORDER — OLANZAPINE 10 MG PO TABS: 10.0000 mg | ORAL_TABLET | Freq: Every day | ORAL | 0 refills | Status: DC

## 2023-09-14 MED ORDER — INSULIN ISOPHANE & REGULAR (HUMAN 70-30)100 UNIT/ML KWIKPEN
28.0000 [IU] | PEN_INJECTOR | Freq: Two times a day (BID) | SUBCUTANEOUS | 0 refills | Status: DC
Start: 2023-09-14 — End: 2023-11-11

## 2023-09-14 MED ORDER — DIVALPROEX SODIUM ER 500 MG PO TB24
500.0000 mg | ORAL_TABLET | Freq: Every day | ORAL | 0 refills | Status: AC
Start: 2023-09-14 — End: ?

## 2023-09-14 NOTE — Progress Notes (Signed)
 Tuberculin skin test applied to left ventral forearm.  Explained not to use lotions or creams, place bandages or wraps on testing area.  Advised, if bleeding occurs, to lightly dab testing area and given 2x2 gauze in case of leakage or bleeding.  Kandi KATHEE Glatter, RN

## 2023-09-14 NOTE — Progress Notes (Signed)
 New Patient Visit   Physician: Kassidie Hendriks A Patti Shorb, MD  Patient: Pamela Rasmussen   DOB: 09/21/1980   43 y.o. Female  MRN: [id number] Visit Date: 09/14/2023   Chief Complaint  Patient presents with   Establish Care    Bipolar and out of insulin     Subjective  Pamela Rasmussen is a 43 y.o. female who presents today as a new patient to establish care.   HPI  Discussed the use of AI scribe software for clinical note transcription with the patient, who gave verbal consent to proceed.  History of Present Illness   Pamela Rasmussen is a 43 year old female with type 2 diabetes and bipolar disorder who presents for management of uncontrolled blood sugar and medication refills.  Hyperglycemia and diabetes management - Blood glucose levels frequently exceed 500-600 mg/dL, sometimes too high to register - Previously on 28 units of 70/30 insulin  twice daily, increased to 40 units twice daily after an emergency room visit, but glucose remains elevated, often in the 400s - Nonadherence to diabetic diet, with significant soda consumption - Discontinued Ozempic due to loss of insurance coverage - Not taking metformin  due to gastrointestinal side effects - Last hemoglobin A1c over 12% - Out of insulin  for two weeks, requiring emergency room visit to obtain insulin  - No episodes of hypoglycemia - No numbness in feet - No palpitations  Mood instability and psychiatric medication nonadherence - Diagnosed with bipolar disorder - Out of Depakote , lithium , Zyprexa , and Paxil  for several days after receiving a one-week supply two weeks ago - Mood described as 'pretty stable' with episodes of sadness and crying - Recent 24-hour psychiatric hold due to medication unavailability and sadness - Mother has bipolar disorder and manages well on medication  Obesity - Works as a Civil [employer] fast streamer - Enjoys biking but does not exercise regularly despite access to an exercise room - Finds  exercise boring - 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   Type 2 diabetes mellitus with other specified complication, with long-term current use of insulin  (HCC) Yes   Bipolar depression (HCC)    Chronic post-traumatic stress disorder (PTSD)    Social anxiety disorder    Bipolar disorder, in full remission, most recent episode mixed (HCC)     Orders Placed This Encounter  Procedures   CBC with Differential/Platelet   Comprehensive metabolic panel with GFR   Hemoglobin A1c   Lipid panel   Urinalysis, Routine w reflex microscopic   Microalbumin / creatinine urine ratio   TSH + free T4   PTH, intact and calcium     Assessment and Plan    Type 2 Diabetes Mellitus Poorly controlled diabetes with blood glucose >500 mg/dL and J8r >87%. Non-adherence to insulin  and diet. Visual disturbances due to lack of insulin . Not on metformin  due to GI side effects. Discussed dietary changes and risks of uncontrolled diabetes. Considered endocrinology referral if control remains inadequate. - Prescribe 70/30 insulin  40 units twice daily. - Refer to dietitian for dietary management. - Order lab work including hemoglobin A1c. - Encourage dietary changes, specifically eliminating sugary drinks. - Consider endocrinology referral if blood sugar remains uncontrolled. - Discuss potential transition to oral medications if blood sugar control improves.  Bipolar Disorder Bipolar disorder with predominance of manic episodes. Non-adherence to medications led  to psychiatric hold. Discussed importance of medication adherence and psychiatric follow-up. - Refill Depakote , Lithium , Zyprexa , and Paxil . - Refer to psychiatry for ongoing management. - Monitor mood stability and medication adherence.     Patient with 2 major issues that we are able to address today.  She  has a number of other things that needs to be addressed.  Plan to have her come back in 2 weeks.  Labs today.  Discussed with patient if she has any acute issues to follow-up immediately.  Patient chronic conditions absolutely uncontrolled at the moment and hopefully we can make small changes to improve her overall situation.    EKG baseline next visit   Objective  Pulse 98   Ht 5' 4 (1.626 m)   Wt 243 lb (110.2 kg)   LMP 08/14/2023   SpO2 98%   BMI 41.71 kg/m      Review of Systems  Constitutional:  Negative for chills, fever and weight loss.  Eyes:  Negative for blurred vision. h Respiratory:  Negative for cough and shortness of breath.   Cardiovascular:  Negative for chest pain and palpitations.  Skin:  Negative for rash.  Psychiatric/Behavioral:  Negative for depression. The patient is not nervous/anxious.      Physical Exam Physical Exam Vitals reviewed.  Constitutional:      Appearance: Normal appearance. Well-developed HENT:     Head: Normocephalic and atraumatic.  Normal mucous membranes, no oral lesions Eyes:     Pupils: Pupils are equal, round, and reactive to light.  Neck:     Thyroid : No thyroid  mass or thyromegaly.  Cardiovascular:     Rate and Rhythm: Normal rate and regular rhythm. Normal heart sounds. Normal peripheral pulses Pulmonary:     Normal breath sounds with normal effort Abdominal:   Abdomen is soft, without tenderness or noted hepatosplenomegaly Musculoskeletal:        General: No swelling or edema  Lymphadenopathy:     Cervical: No cervical adenopathy.  Skin:    General: Skin is warm and dry without noticeable rash. Neurological:     General: No focal deficit present.  Psychiatric:        Mood and Affect: Mood, behavior and cognition normal   Past Medical History:  Diagnosis Date   Allergy    Anxiety    Bipolar 1 disorder (HCC)    Depression    Diabetes mellitus without complication (HCC)    Vertigo    Past Surgical  History:  Procedure Laterality Date   CHOLECYSTECTOMY     Family Status  Relation Name Status   Mother  Alive   Father  Alive   Sister  Alive   Brother  Alive       is in a group home   Mat Aunt  Deceased   PGM  Deceased  No partnership data on file   Family History  Problem Relation Age of Onset   Bipolar disorder Mother    Down syndrome Brother    Schizophrenia Maternal Aunt    Bipolar disorder Paternal Grandmother    Schizophrenia Paternal Grandmother    Social History   Socioeconomic History   Marital status: Single    Spouse name: Not on file   Number of children: 9   Years of education: Not on file   Highest education level: Associate degree: occupational, Scientist, product/process development, or vocational program  Occupational History    Comment: not employed  Tobacco Use   Smoking status: Never   Smokeless tobacco:  Never  Vaping Use   Vaping status: Never Used  Substance and Sexual Activity   Alcohol use: No   Drug use: No   Sexual activity: Yes    Partners: Male    Birth control/protection: None    Comment: Condoms sometimes  Other Topics Concern   Not on file  Social History Narrative   Not on file   Social Drivers of Health   Financial Resource Strain: Low Risk  (04/22/2022)   [facility]   Overall Financial Resource Strain (CARDIA)    Difficulty of Paying Living Expenses: Not very hard  Food Insecurity: No Food Insecurity (05/31/2023)   Hunger Vital Sign    Worried About Running Out of Food in the Last Year: Never true    Ran Out of Food in the Last Year: Never true  Transportation Needs: No Transportation Needs (05/31/2023)   PRAPARE - Administrator, Civil Service (Medical): No    Lack of Transportation (Non-Medical): No  Physical Activity: Sufficiently Active (02/06/2021)   Received from [facility]   Exercise Vital Sign    On average, how many days per week do you engage in moderate to strenuous exercise (like a brisk walk)?: 6 days     On average, how many minutes do you engage in exercise at this level?: 120 min  Stress: Stress Concern Present (01/04/2018)   [employer] of Occupational Health - Occupational Stress Questionnaire    Feeling of Stress : Rather much  Social Connections: Unknown (05/31/2023)   Social Connection and Isolation Panel    Frequency of Communication with Friends and Family: Not on file    Frequency of Social Gatherings with Friends and Family: Not on file    Attends Religious Services: Not on file    Active Member of Clubs or Organizations: Not on file    Attends Banker Meetings: Not on file    Marital Status: Living with partner   Outpatient Medications Prior to Visit  Medication Sig   Insulin  Pen Needle (PEN NEEDLES) 31G X 5 MM MISC 1 each by Does not apply route 3 (three) times daily. May dispense any manufacturer covered by patient's insurance.   valACYclovir  (VALTREX ) 500 MG tablet Take 1 tablet (500 mg total) by mouth daily.   [DISCONTINUED] divalproex  (DEPAKOTE  ER) 500 MG 24 hr tablet Take 1 tablet (500 mg total) by mouth at bedtime.   [DISCONTINUED] insulin  isophane & regular human KwikPen (HUMULIN 70/30 MIX) (70-30) 100 UNIT/ML KwikPen Inject 28 Units into the skin 2 (two) times daily.   [DISCONTINUED] lithium  300 MG tablet Take 1.5 tablets (450 mg total) by mouth every evening for 6 days.   [DISCONTINUED] OLANZapine  (ZYPREXA ) 10 MG tablet Take 1 tablet (10 mg total) by mouth at bedtime.   [DISCONTINUED] PARoxetine  (PAXIL ) 10 MG tablet Take 1 tablet (10 mg total) by mouth daily for 7 days.   Continuous Glucose Sensor (FREESTYLE LIBRE 3 SENSOR) MISC Place 1 sensor on the skin every 14 days. Use to check glucose continuously   Glucagon , rDNA, (GLUCAGON  EMERGENCY) 1 MG KIT Inject 1 mg into the skin as needed for up to 2 doses (Severe low blood sugar).   metFORMIN  (GLUCOPHAGE ) 500 MG tablet Take 1 tablet (500 mg total) by mouth 2 (two) times daily with a meal. (Patient not  taking: Reported on 09/14/2023)   metroNIDAZOLE  (FLAGYL ) 500 MG tablet Take 500 mg by mouth 2 (two) times daily.   nitrofurantoin , macrocrystal-monohydrate, (  MACROBID ) 100 MG capsule Take 100 mg by mouth every 12 (twelve) hours.   ondansetron  (ZOFRAN -ODT) 4 MG disintegrating tablet Take 4 mg by mouth every 8 (eight) hours as needed.   No facility-administered medications prior to visit.   Allergies  Allergen Reactions   Levaquin [Levofloxacin In D5w] Itching   Penicillins Rash    Has patient had a PCN reaction causing immediate rash, facial/tongue/throat swelling, SOB or lightheadedness with hypotension: No Has patient had a PCN reaction causing severe rash involving mucus membranes or skin necrosis: No Has patient had a PCN reaction that required hospitalization: No Has patient had a PCN reaction occurring within the last 10 years: No If all of the above answers are NO, then may proceed with Cephalosporin use.     Immunization History  Administered Date(s) Administered   Dtap, Unspecified 05/15/1981, 07/18/1981, 09/19/1981, 10/08/1982, 04/10/1986   Hepatitis A, Adult 01/11/2009   Influenza, Quadrivalent, Recombinant, Inj, Pf 11/23/2018   Influenza,inj,Quad PF,6+ Mos 04/11/2016, 11/10/2017   Influenza,inj,quad, With Preservative 11/04/2019   Influenza-Unspecified 04/17/2017, 11/18/2019, 11/03/2020   MMR 08/29/1982, 07/26/2015   PFIZER(Purple Top)SARS-COV-2 Vaccination 05/15/2019, 06/05/2019, 01/13/2020, 02/04/2020   PPD Test 03/28/2014, 08/23/2020, 12/06/2021, 09/14/2023   Pneumococcal Polysaccharide-23 05/29/2020   Polio, Unspecified 05/15/1981, 07/18/1981, 10/08/1982, 04/10/1986   Td 11/03/2000   Tdap 09/25/2010, 10/03/2020    Health Maintenance  Topic Date Due   FOOT EXAM  Never done   OPHTHALMOLOGY EXAM  Never done   Diabetic kidney evaluation - Urine ACR  Never done   Hepatitis C Screening  Never done   Hepatitis B Vaccines (1 of 3 - 19+ 3-dose series) Never done    HPV VACCINES (1 - 3-dose SCDM series) Never done   COVID-19 Vaccine (5 - 2024-25 season) 10/05/2022   Cervical Cancer Screening (HPV/Pap Cotest)  03/06/2023   INFLUENZA VACCINE  09/04/2023   HEMOGLOBIN A1C  11/30/2023   Diabetic kidney evaluation - eGFR measurement  08/27/2024   DTaP/Tdap/Td (9 - Td or Tdap) 10/04/2030   Pneumococcal Vaccine: 19-49 Years  Completed   HIV Screening  Completed   Meningococcal B Vaccine  Aged Out    Patient Care Team: Camelia Sherwood BIRCH, FNP as PCP - General (Nurse Practitioner)  Depression Screen    07/11/2020   10:43 AM 04/18/2020    9:52 AM 03/21/2020   10:17 AM  PHQ 2/9 Scores  PHQ - 2 Score        Information is confidential and restricted. Go to Review Flowsheets to unlock data.     Parris DELENA Juneau, MD  [facility] [phone] (phone) [phone] (fax)  [facility]

## 2023-09-15 ENCOUNTER — Ambulatory Visit: Payer: Self-pay

## 2023-09-15 LAB — URINALYSIS, ROUTINE W REFLEX MICROSCOPIC
Bacteria, UA: NONE SEEN /HPF
Bilirubin Urine: NEGATIVE
Hyaline Cast: NONE SEEN /LPF
Ketones, ur: NEGATIVE
Nitrite: NEGATIVE
Protein, ur: NEGATIVE
Specific Gravity, Urine: 1.032 (ref 1.001–1.035)
pH: <=5 — AB (ref 5.0–8.0)

## 2023-09-15 LAB — CBC WITH DIFFERENTIAL/PLATELET
Absolute Lymphocytes: 3392 {cells}/uL (ref 850–3900)
Absolute Monocytes: 817 {cells}/uL (ref 200–950)
Basophils Absolute: 73 {cells}/uL (ref 0–200)
Basophils Relative: 0.6 %
Eosinophils Absolute: 293 {cells}/uL (ref 15–500)
Eosinophils Relative: 2.4 %
HCT: 44.7 % (ref 35.0–45.0)
Hemoglobin: 15.1 g/dL (ref 11.7–15.5)
MCH: 32.1 pg (ref 27.0–33.0)
MCHC: 33.8 g/dL (ref 32.0–36.0)
MCV: 94.9 fL (ref 80.0–100.0)
MPV: 10.4 fL (ref 7.5–12.5)
Monocytes Relative: 6.7 %
Neutro Abs: 7625 {cells}/uL (ref 1500–7800)
Neutrophils Relative %: 62.5 %
Platelets: 245 Thousand/uL (ref 140–400)
RBC: 4.71 Million/uL (ref 3.80–5.10)
RDW: 12.5 % (ref 11.0–15.0)
Total Lymphocyte: 27.8 %
WBC: 12.2 Thousand/uL — ABNORMAL HIGH (ref 3.8–10.8)

## 2023-09-15 LAB — COMPREHENSIVE METABOLIC PANEL WITH GFR
AG Ratio: 1.3 (calc) (ref 1.0–2.5)
ALT: 71 U/L — ABNORMAL HIGH (ref 6–29)
AST: 38 U/L — ABNORMAL HIGH (ref 10–30)
Albumin: 4.3 g/dL (ref 3.6–5.1)
Alkaline phosphatase (APISO): 120 U/L (ref 31–125)
BUN: 11 mg/dL (ref 7–25)
CO2: 24 mmol/L (ref 20–32)
Calcium: 9.3 mg/dL (ref 8.6–10.2)
Chloride: 97 mmol/L — ABNORMAL LOW (ref 98–110)
Creat: 0.93 mg/dL (ref 0.50–0.99)
Globulin: 3.2 g/dL (ref 1.9–3.7)
Glucose, Bld: 391 mg/dL — ABNORMAL HIGH (ref 65–99)
Potassium: 4.2 mmol/L (ref 3.5–5.3)
Sodium: 133 mmol/L — ABNORMAL LOW (ref 135–146)
Total Bilirubin: 0.3 mg/dL (ref 0.2–1.2)
Total Protein: 7.5 g/dL (ref 6.1–8.1)
eGFR: 79 mL/min/1.73m2 (ref >=60)

## 2023-09-15 LAB — PTH, INTACT AND CALCIUM
Calcium: 9.3 mg/dL (ref 8.6–10.2)
PTH: 75 pg/mL (ref 16–77)

## 2023-09-15 LAB — LIPID PANEL
Cholesterol: 267 mg/dL — ABNORMAL HIGH (ref <200)
HDL: 46 mg/dL — ABNORMAL LOW (ref >=50)
Non-HDL Cholesterol (Calc): 221 mg/dL — ABNORMAL HIGH (ref <130)
Total CHOL/HDL Ratio: 5.8 (calc) — ABNORMAL HIGH (ref <5.0)
Triglycerides: 531 mg/dL — ABNORMAL HIGH (ref <150)

## 2023-09-15 LAB — HEMOGLOBIN A1C

## 2023-09-15 LAB — MICROALBUMIN / CREATININE URINE RATIO
Creatinine, Urine: 46 mg/dL (ref 20–275)
Microalb Creat Ratio: 20 mg/g{creat} (ref <30)
Microalb, Ur: 0.9 mg/dL

## 2023-09-15 LAB — MICROSCOPIC MESSAGE

## 2023-09-15 LAB — TSH+FREE T4: TSH W/REFLEX TO FT4: 0.51 m[IU]/L

## 2023-09-16 ENCOUNTER — Ambulatory Visit (LOCAL_COMMUNITY_HEALTH_CENTER)

## 2023-09-16 DIAGNOSIS — Z111 Encounter for screening for respiratory tuberculosis: Secondary | ICD-10-CM

## 2023-09-16 LAB — TB SKIN TEST
Induration: 0 mm
TB Skin Test: NEGATIVE

## 2023-09-16 NOTE — Progress Notes (Signed)
 Pt presents for PPDR PPD read and results entered in EpicCare. Result: 0 mm induration. Interpretation: Negative  Kandi KATHEE Glatter, RN

## 2023-09-24 ENCOUNTER — Other Ambulatory Visit: Payer: Self-pay

## 2023-09-24 ENCOUNTER — Ambulatory Visit (INDEPENDENT_AMBULATORY_CARE_PROVIDER_SITE_OTHER)

## 2023-09-24 VITALS — BP 120/78 | HR 95 | Ht 64.0 in | Wt 240.1 lb

## 2023-09-24 DIAGNOSIS — F3178 Bipolar disorder, in full remission, most recent episode mixed: Secondary | ICD-10-CM

## 2023-09-24 DIAGNOSIS — F3162 Bipolar disorder, current episode mixed, moderate: Secondary | ICD-10-CM

## 2023-09-24 DIAGNOSIS — R7989 Other specified abnormal findings of blood chemistry: Secondary | ICD-10-CM

## 2023-09-24 DIAGNOSIS — E1169 Type 2 diabetes mellitus with other specified complication: Secondary | ICD-10-CM | POA: Diagnosis not present

## 2023-09-24 DIAGNOSIS — E119 Type 2 diabetes mellitus without complications: Secondary | ICD-10-CM | POA: Diagnosis not present

## 2023-09-24 DIAGNOSIS — Z79899 Other long term (current) drug therapy: Secondary | ICD-10-CM

## 2023-09-24 DIAGNOSIS — F4010 Social phobia, unspecified: Secondary | ICD-10-CM

## 2023-09-24 DIAGNOSIS — E782 Mixed hyperlipidemia: Secondary | ICD-10-CM

## 2023-09-24 DIAGNOSIS — F4312 Post-traumatic stress disorder, chronic: Secondary | ICD-10-CM

## 2023-09-24 DIAGNOSIS — F319 Bipolar disorder, unspecified: Secondary | ICD-10-CM

## 2023-09-24 DIAGNOSIS — Z794 Long term (current) use of insulin: Secondary | ICD-10-CM

## 2023-09-24 MED ORDER — SULFAMETHOXAZOLE-TRIMETHOPRIM 800-160 MG PO TABS: 1.0000 | ORAL_TABLET | Freq: Two times a day (BID) | ORAL | 0 refills | Status: DC

## 2023-09-24 MED ORDER — ROSUVASTATIN CALCIUM 10 MG PO TABS
10.0000 mg | ORAL_TABLET | Freq: Every day | ORAL | 3 refills | Status: AC
Start: 2023-09-24 — End: ?

## 2023-09-24 MED ORDER — OLANZAPINE 10 MG PO TABS: 10.0000 mg | ORAL_TABLET | Freq: Every day | ORAL | 3 refills | Status: AC

## 2023-09-24 MED ORDER — FLUCONAZOLE 150 MG PO TABS: 150.0000 mg | ORAL_TABLET | Freq: Once | ORAL | 0 refills | Status: AC

## 2023-09-24 MED ORDER — PAROXETINE HCL 10 MG PO TABS: 10.0000 mg | ORAL_TABLET | Freq: Every day | ORAL | 3 refills | Status: AC

## 2023-09-24 MED ORDER — SULFAMETHOXAZOLE-TRIMETHOPRIM 800-160 MG PO TABS: 1.0000 | ORAL_TABLET | Freq: Two times a day (BID) | ORAL | 0 refills | Status: AC

## 2023-09-24 MED ORDER — DIVALPROEX SODIUM ER 500 MG PO TB24: 500.0000 mg | ORAL_TABLET | Freq: Every day | ORAL | 3 refills | Status: AC

## 2023-09-24 MED ORDER — LITHIUM CARBONATE 300 MG PO TABS
450.0000 mg | ORAL_TABLET | Freq: Every evening | ORAL | 3 refills | Status: AC
Start: 2023-09-24 — End: 2024-05-21

## 2023-09-24 NOTE — Progress Notes (Unsigned)
 Progress Note  Physician: Hertha Gergen A Alaynah Schutter, MD   HPI: Pamela Rasmussen is a 43 y.o. female presenting on 09/24/2023 for Follow-up (Concerned about a yeast infection, Needs medication refill.) .  Discussed the use of AI scribe software for clinical note transcription with the patient, who gave verbal consent to proceed.  History of Present Illness   Pamela Rasmussen is a 43 year old female with diabetes who presents with uncontrolled blood sugar levels.  Hyperglycemia - Consistently elevated blood glucose levels for approximately one year - Postprandial glucose readings typically 250-300 mg/dL - Lowest recorded glucose level is 220 mg/dL - Current insulin  regimen: 40 units twice daily with persistent hyperglycemia - Previous use of Ozempic resulted in weight loss but did not significantly lower blood glucose - Intolerant to metformin  - Increased lethargy associated with hyperglycemia - No numbness in feet  - Has stopped drinking soda, but drinks juice frequently.  Has minimal understanding of diabetic diet - dietician consulted  Recurrent urinary tract infections and candida - Urinary tract infections occur every 2-3 weeks - Symptoms include burning and discomfort - Frequent yeast infections   Lifestyle and dietary modifications - Actively reduces intake of high glycemic foods such as white rice, white bread, and potatoes - Engages in regular physical activity, including walking - No alcohol or drug use - Sexually active  Medication intolerances and allergies - Allergic to penicillin and Levaquin, which cause rash     Bipolar disorder   - Mood stable with medications recently restarted    Medical history:  Relevant past medical, surgical, family and social history reviewed and updated as indicated. Interim medical history since our last visit reviewed.  Allergies and medications reviewed and updated.   ROS: Negative unless specifically  indicated above in HPI.    Current Outpatient Medications:    Continuous Glucose Sensor (FREESTYLE LIBRE 3 SENSOR) MISC, Place 1 sensor on the skin every 14 days. Use to check glucose continuously, Disp: 2 each, Rfl: 0   Glucagon , rDNA, (GLUCAGON  EMERGENCY) 1 MG KIT, Inject 1 mg into the skin as needed for up to 2 doses (Severe low blood sugar)., Disp: 1 kit, Rfl: 0   insulin  isophane & regular human KwikPen (HUMULIN 70/30 MIX) (70-30) 100 UNIT/ML KwikPen, Inject 28 Units into the skin 2 (two) times daily., Disp: 15 mL, Rfl: 0   Insulin  Pen Needle (PEN NEEDLES) 31G X 5 MM MISC, 1 each by Does not apply route 3 (three) times daily. May dispense any manufacturer covered by patient's insurance., Disp: 100 each, Rfl: 0   ondansetron  (ZOFRAN -ODT) 4 MG disintegrating tablet, Take 4 mg by mouth every 8 (eight) hours as needed., Disp: , Rfl:    rosuvastatin  (CRESTOR ) 10 MG tablet, Take 1 tablet (10 mg total) by mouth daily., Disp: 90 tablet, Rfl: 3   valACYclovir  (VALTREX ) 500 MG tablet, Take 1 tablet (500 mg total) by mouth daily., Disp: 30 tablet, Rfl: 12   divalproex  (DEPAKOTE  ER) 500 MG 24 hr tablet, Take 1 tablet (500 mg total) by mouth at bedtime., Disp: 90 tablet, Rfl: 3   lithium  300 MG tablet, Take 1.5 tablets (450 mg total) by mouth every evening., Disp: 90 tablet, Rfl: 3   metFORMIN  (GLUCOPHAGE ) 500 MG tablet, Take 1 tablet (500 mg total) by mouth 2 (two) times daily with a meal. (Patient not taking: Reported on 09/14/2023), Disp: 60 tablet, Rfl: 2   OLANZapine  (ZYPREXA ) 10 MG tablet,  Take 1 tablet (10 mg total) by mouth at bedtime., Disp: 90 tablet, Rfl: 3   PARoxetine  (PAXIL ) 10 MG tablet, Take 1 tablet (10 mg total) by mouth daily., Disp: 90 tablet, Rfl: 3   sulfamethoxazole -trimethoprim  (BACTRIM  DS) 800-160 MG tablet, Take 1 tablet by mouth 2 (two) times daily for 7 days., Disp: 14 tablet, Rfl: 0       Objective:     BP 120/78 (BP Location: Left Arm, Patient Position: Sitting, Cuff Size:  Large)   Pulse 95   Ht 5' 4 (1.626 m)   Wt 240 lb 2 oz (108.9 kg)   LMP 09/12/2023 (Approximate)   SpO2 97%   BMI 41.22 kg/m   Wt Readings from Last 3 Encounters:  09/24/23 240 lb 2 oz (108.9 kg)  09/14/23 243 lb (110.2 kg)  08/28/23 248 lb 14.4 oz (112.9 kg)    Physical Exam  Physical Exam Vitals reviewed.  Constitutional:      Appearance: Normal appearance. Well-developed with normal weight.  Cardiovascular:     Rate and Rhythm: Normal rate and regular rhythm. Normal heart sounds. Normal peripheral pulses Pulmonary:     Normal breath sounds with normal effort Skin:    General: Skin is warm and dry without noticeable rash. Neurological:     General: No focal deficit present.  Psychiatric:        Mood and Affect: Mood, behavior and cognition normal      Assessment & Plan:   Encounter Diagnoses  Name Primary?   Bipolar 1 disorder, mixed, moderate (HCC) Yes   High risk medication use    Type 2 diabetes mellitus without complication, with long-term current use of insulin  (HCC)    Type 2 diabetes mellitus with other specified complication, with long-term current use of insulin  (HCC)    Bipolar depression (HCC)    Chronic post-traumatic stress disorder (PTSD)    Social anxiety disorder    Bipolar disorder, in full remission, most recent episode mixed (HCC)    Mixed hyperlipidemia    LFT elevation     Orders Placed This Encounter  Procedures   US  Abdomen Complete   Ambulatory referral to Endocrinology     Assessment and Plan    Type 2 diabetes mellitus with hyperglycemia - uncontrolled.  Chronic hyperglycemia with postprandial glucose levels ranging from 250 to 350 mg/dL for years indicating poor glycemic control. Previous treatment with Ozempic aided in weight loss but did not adequately control glucose levels. Current insulin  regimen is insufficient, necessitating optimization of diabetes management. Risks of uncontrolled diabetes include lethargy, cognitive  impairment, recurrent infections, and potential long-term complications such as diabetic ulcers, amputations, and kidney damage. - Increase insulin  dosage to 50 units twice daily. - Refer to endocrinology for further management and potential medication adjustments. - Provide dietary guidance and refer to a dietitian for personalized dietary planning. - Encourage regular physical activity and provide educational resources on diabetes management. - Advise monitoring of blood glucose levels and maintaining a sugar log.  Recurrent urinary tract infections Frequent urinary tract infections with symptoms of burning and discomfort, likely exacerbated by uncontrolled diabetes. - Prescribe nitrofurantoin  for urinary tract infection.  Recurrent vulvovaginal candidiasis Frequent yeast infections, likely related to poorly controlled diabetes. Over-the-counter treatments are recommended due to potential interactions with current medications. - Recommend over-the-counter yeast infection treatments. - Prescribe fluconazole  if over-the-counter treatments are ineffective.  Hyperlipidemia Elevated cholesterol levels with a total cholesterol of 267 mg/dL. Not currently on a statin, which is recommended for  all diabetics to reduce cardiovascular risk and potentially aid in managing fatty liver disease. Statins are generally well-tolerated, though muscle aches can occur. - Prescribe a statin medication to manage hyperlipidemia and reduce cardiovascular risk.  Suspected nonalcoholic fatty liver disease Elevated liver enzymes suggestive of fatty liver disease, likely related to hyperlipidemia and obesity. - Order an ultrasound of the liver to evaluate for fatty liver disease.  Obesity Obesity is contributing to overall health issues, including diabetes and suspected fatty liver disease. - Encourage dietary modifications and increased physical activity to aid in weight management.   Bipolar disorder  - Continue  current medications.  She will establish with psychiatry as well.     Will f/u with this patient in 4 weeks

## 2023-09-28 ENCOUNTER — Ambulatory Visit

## 2023-09-30 ENCOUNTER — Ambulatory Visit
Admission: RE | Admit: 2023-09-30 | Discharge: 2023-09-30 | Disposition: A | Discharge status: Home / Self Care | Source: Ambulatory Visit

## 2023-09-30 DIAGNOSIS — R7989 Other specified abnormal findings of blood chemistry: Secondary | ICD-10-CM | POA: Insufficient documentation

## 2023-10-06 ENCOUNTER — Ambulatory Visit: Payer: Self-pay

## 2023-10-06 DIAGNOSIS — K760 Fatty (change of) liver, not elsewhere classified: Secondary | ICD-10-CM | POA: Insufficient documentation

## 2023-10-19 ENCOUNTER — Ambulatory Visit (INDEPENDENT_AMBULATORY_CARE_PROVIDER_SITE_OTHER)

## 2023-10-19 VITALS — BP 120/82 | HR 92 | Temp 98.4°F | Wt 239.0 lb

## 2023-10-19 DIAGNOSIS — Z23 Encounter for immunization: Secondary | ICD-10-CM

## 2023-10-19 DIAGNOSIS — F319 Bipolar disorder, unspecified: Secondary | ICD-10-CM

## 2023-10-19 DIAGNOSIS — E1165 Type 2 diabetes mellitus with hyperglycemia: Secondary | ICD-10-CM | POA: Diagnosis not present

## 2023-10-19 DIAGNOSIS — Z794 Long term (current) use of insulin: Secondary | ICD-10-CM

## 2023-10-19 DIAGNOSIS — K760 Fatty (change of) liver, not elsewhere classified: Secondary | ICD-10-CM

## 2023-10-19 DIAGNOSIS — R7989 Other specified abnormal findings of blood chemistry: Secondary | ICD-10-CM | POA: Insufficient documentation

## 2023-10-19 DIAGNOSIS — B379 Candidiasis, unspecified: Secondary | ICD-10-CM

## 2023-10-19 DIAGNOSIS — F3177 Bipolar disorder, in partial remission, most recent episode mixed: Secondary | ICD-10-CM

## 2023-10-19 MED ORDER — FLUCONAZOLE 150 MG PO TABS: 150.0000 mg | ORAL_TABLET | Freq: Once | ORAL | 0 refills | Status: AC

## 2023-10-19 NOTE — Progress Notes (Signed)
 Progress Note  Physician: Parris DELENA Juneau, MD   HPI: Pamela Rasmussen is a 43 y.o. female presenting on 10/19/2023 for Follow-up .  Discussed the use of AI scribe software for clinical note transcription with the patient, who gave verbal consent to proceed.  History of Present Illness   Pamela Rasmussen is a 43 year old female with diabetes and hyperlipidemia who presents for follow-up of her blood sugar and cholesterol management.  Hyperglycemia - Blood glucose levels improved, currently ranging from 150 to 200 mg/dL - No episodes of hypoglycemia - Currently using 50 units of humalin twice daily - Awaiting contact from endocrinology for further management - Adheres to a diabetic diet currently.   Vulvovaginal candidiasis - Current yeast infection present - itching burning - Recurrrent yeast infections in the setting of hyperglycemia  Hyperlipidemia - Recently started on rosuvastatin  - No adverse effects from rosuvastatin   Bipolar disorder - on lithium , zyprexa , paraxetine - Mood is stable - Energy levels are good     - psych referral pending     Medical history:  Relevant past medical, surgical, family and social history reviewed and updated as indicated. Interim medical history since our last visit reviewed.  Allergies and medications reviewed and updated.   ROS: Negative unless specifically indicated above in HPI.    Current Outpatient Medications:    fluconazole  (DIFLUCAN ) 150 MG tablet, Take 1 tablet (150 mg total) by mouth once for 1 dose., Disp: 1 tablet, Rfl: 0   Continuous Glucose Sensor (FREESTYLE LIBRE 3 SENSOR) MISC, Place 1 sensor on the skin every 14 days. Use to check glucose continuously, Disp: 2 each, Rfl: 0   divalproex  (DEPAKOTE  ER) 500 MG 24 hr tablet, Take 1 tablet (500 mg total) by mouth at bedtime., Disp: 90 tablet, Rfl: 3   Glucagon , rDNA, (GLUCAGON  EMERGENCY) 1 MG KIT, Inject 1 mg into the skin as needed for up to 2  doses (Severe low blood sugar)., Disp: 1 kit, Rfl: 0   insulin  isophane & regular human KwikPen (HUMULIN 70/30 MIX) (70-30) 100 UNIT/ML KwikPen, Inject 28 Units into the skin 2 (two) times daily., Disp: 15 mL, Rfl: 0   Insulin  Pen Needle (PEN NEEDLES) 31G X 5 MM MISC, 1 each by Does not apply route 3 (three) times daily. May dispense any manufacturer covered by patient's insurance., Disp: 100 each, Rfl: 0   lithium  300 MG tablet, Take 1.5 tablets (450 mg total) by mouth every evening., Disp: 90 tablet, Rfl: 3   metFORMIN  (GLUCOPHAGE ) 500 MG tablet, Take 1 tablet (500 mg total) by mouth 2 (two) times daily with a meal. (Patient not taking: Reported on 09/14/2023), Disp: 60 tablet, Rfl: 2   OLANZapine  (ZYPREXA ) 10 MG tablet, Take 1 tablet (10 mg total) by mouth at bedtime., Disp: 90 tablet, Rfl: 3   ondansetron  (ZOFRAN -ODT) 4 MG disintegrating tablet, Take 4 mg by mouth every 8 (eight) hours as needed., Disp: , Rfl:    PARoxetine  (PAXIL ) 10 MG tablet, Take 1 tablet (10 mg total) by mouth daily., Disp: 90 tablet, Rfl: 3   rosuvastatin  (CRESTOR ) 10 MG tablet, Take 1 tablet (10 mg total) by mouth daily., Disp: 90 tablet, Rfl: 3   valACYclovir  (VALTREX ) 500 MG tablet, Take 1 tablet (500 mg total) by mouth daily., Disp: 30 tablet, Rfl: 12       Objective:     BP 120/82 (BP Location: Left Arm, Patient Position: Sitting, Cuff  Size: Large)   Pulse 92   Temp 98.4 F (36.9 C) (Oral)   Wt 239 lb (108.4 kg)   BMI 41.02 kg/m   Wt Readings from Last 3 Encounters:  10/19/23 239 lb (108.4 kg)  09/24/23 240 lb 2 oz (108.9 kg)  09/14/23 243 lb (110.2 kg)    Physical Exam  Physical Exam Vitals reviewed.  Constitutional:      Appearance: Normal appearance. Well-developed with normal weight.  Cardiovascular:     Rate and Rhythm: Normal rate and regular rhythm. Normal heart sounds. Normal peripheral pulses Pulmonary:     Normal breath sounds with normal effort Skin:    General: Skin is warm and dry  without noticeable rash. Neurological:     General: No focal deficit present.  Psychiatric:        Mood and Affect: Mood, behavior and cognition normal      Assessment & Plan:   Encounter Diagnoses  Name Primary?   LFT elevation Yes   Candida albicans infection    Type 2 diabetes mellitus with hyperglycemia, with long-term current use of insulin  (HCC)    Bipolar disorder, in partial remission, most recent episode mixed (HCC)    Bipolar depression (HCC)    Hepatic steatosis     Orders Placed This Encounter  Procedures   Hemoglobin A1c   Comprehensive metabolic panel with GFR   CBC with Differential/Platelet   Microalbumin / creatinine urine ratio   Iron, TIBC and Ferritin Panel     Assessment and Plan    Type 2 diabetes mellitus Blood glucose levels have improved to 150-200 mg/dL from 749-699 mg/dL. She is on 50 units of insulin  twice daily with no hypoglycemic episodes. Awaiting endocrinology consultation for further management. Discussed potential use of an implantable glucose monitor. - Ensure endocrinology has correct contact information for follow-up. - Continue insulin  regimen of 50 units twice daily. - Discuss potential use of an implantable glucose monitor with endocrinology.  - Check sugars at home and keep log for endocrinology - Previous use of Ozempic resulted in weight loss but did not significantly lower blood glucose - Intolerant to metformin   - Diabetic diet compliance could be improved  Nonalcoholic fatty liver disease with hepatomegaly Ultrasound confirms fatty liver with mild hepatomegaly. Liver enzymes are mildly elevated at 38 and 71 U/L. Need to assess for fibrosis  - Order additional liver testing - elastography - Review current medications for potential liver impact. - Re-evaluate liver enzymes with labs in December.  Hyperlipidemia Currently on rosuvastatin  with no reported issues. Lipid levels to be re-evaluated in December. - Continue  rosuvastatin  therapy. - Recheck lipid panel in December.  Recurrent vulvovaginal candidiasis Reports another episode of yeast infection. Previous treatment with fluconazole  was effective. Advised on limited use of fluconazole  due to liver metabolism, but noted it can be more effective than over-the-counter treatments. - Prescribe fluconazole  for current yeast infection. - Advise on limited use of fluconazole  due to liver metabolism.  Depression Mood is well-managed on current medication regimen. No issues with medication adherence or side effects. Awaiting psychiatry consultation for further management. - Ensure psychiatry has correct contact information for follow-up. - Monitor mood and medication effects, and contact provider if issues arise.        Fu December with lab studies/

## 2023-10-22 ENCOUNTER — Ambulatory Visit (INDEPENDENT_AMBULATORY_CARE_PROVIDER_SITE_OTHER)

## 2023-10-22 ENCOUNTER — Ambulatory Visit: Payer: Self-pay

## 2023-10-22 VITALS — BP 118/78 | HR 87 | Temp 98.8°F | Wt 239.5 lb

## 2023-10-22 DIAGNOSIS — J069 Acute upper respiratory infection, unspecified: Secondary | ICD-10-CM | POA: Diagnosis not present

## 2023-10-22 NOTE — Progress Notes (Signed)
 Acute Patient Visit  Physician: Gaspar Fowle A Tereka Thorley, MD  Patient: Pamela Rasmussen MRN: [id number] DOB: [birth year] PCP: Maleia Weems A, MD     Subjective:   Chief Complaint  Patient presents with   Cough    X 3 days--productive at time light yellow/grey color... chest congestion--- denies fever/chills Pt has not tried OTC meds Denies exposure to covid/flu she is aware of     HPI: The patient is a 43 y.o. female who presents today for:   Discussed the use of AI scribe software for clinical note transcription with the patient, who gave verbal consent to proceed.  History of Present Illness   Pamela Rasmussen is a 43 year old female with diabetes who presents with a cough and mucus production for two days.  Cough and sputum production - Cough present for 2 days - Significant mucus production - No fever, chills, or shortness of breath - Occasionally wakes up at night due to coughing - Able to eat and drink normally - General feeling of malaise ('icky')  Glycemic control - Diabetes with generally well-controlled blood sugar levels.  She remains on humalin 70/30 and reports sugars stable/well controlled     ROS:   As noted in the HPI    ASSESMENT/PLAN:  Encounter Diagnoses  Name Primary?   URTI (acute upper respiratory infection) Yes    No orders of the defined types were placed in this encounter.   Assessment and Plan    Acute bronchitis with productive cough Acute bronchitis likely viral. No pneumonia signs. No fever, chills, or shortness of breath. - Advise increased fluid intake. - Recommend over-the-counter cough medicine. - Suggest rest and mask-wearing at work. - Instruct to monitor for fever, chills, or shortness of breath and seek medical attention if these occur. - Advise nighttime cough suppressant if coughing disrupts sleep.  Type 2 diabetes mellitus Type 2 diabetes well-managed. Advised monitoring blood sugar closely during  illness due to potential rise. Emphasized maintaining food intake and adjusting insulin  only if blood sugars are >300 or if not eating. - Monitor blood sugar levels more closely during illness. - Maintain regular food intake. - Avoid adjusting insulin  otherwise - Reduce insulin  if unable to eat due to illness to prevent hypoglycemia.     Call office if you have any of these symptoms: ? Not able to eat or drink for more than 4 hours ? Vomiting or have diarrhea for more than 6 hours ? A temperature more than 101.5 degrees F ? An illness that lasts more than 24 hours ? Stomach pain, chest pain or a hard time breathing ? Blood glucose level over 300 mg/dL for more than 2 blood glucose checks       OBJECTIVE: Vitals:   10/22/23 1322  BP: 118/78  Pulse: 87  Temp: 98.8 F (37.1 C)  TempSrc: Oral  SpO2: 97%  Weight: 239 lb 8 oz (108.6 kg)    Body mass index is 41.11 kg/m.   Physical Exam Vitals reviewed.  Constitutional:      Appearance: Normal appearance. Well-developed with normal weight.  Cardiovascular:     Rate and Rhythm: Normal rate and regular rhythm. Normal heart sounds. Normal peripheral pulses Pulmonary:     Normal breath sounds with normal effort Skin:    General: Skin is warm and dry without noticeable rash. Neurological:     General: No focal deficit present.  Psychiatric:        Mood and Affect: Mood,  behavior and cognition normal       Allergies Patient is allergic to levaquin [levofloxacin in d5w] and penicillins.  Past Medical History Patient  has a past medical history of Abscess of lower back (06/01/2023), Allergy, Anxiety, Bipolar 1 disorder (HCC), Cellulitis of back except buttock (06/01/2023), Depression, Diabetes mellitus without complication (HCC), Perianal abscess (03/27/2022), Pseudohyponatremia (05/31/2023), and Vertigo.  Surgical History Patient  has a past surgical history that includes Cholecystectomy.  Family History Pateint's family  history includes Bipolar disorder in her mother and paternal grandmother; Down syndrome in her brother; Schizophrenia in her maternal aunt and paternal grandmother.  Social History Patient  reports that she has never smoked. She has never used smokeless tobacco. She reports that she does not drink alcohol and does not use drugs.    10/22/2023

## 2023-10-22 NOTE — Telephone Encounter (Signed)
 FYI Only or Action Required?: FYI only for provider.  Patient was last seen in primary care on 10/19/2023 by Zafirov, Clarissa A, MD.  Called Nurse Triage reporting Cough.  Symptoms began 3 days ago .  Interventions attempted: Nothing.  Symptoms are: unchanged.  Triage Disposition: see physician in 24 hours  Patient/caregiver understands and will follow disposition?: yes        Copied from CRM #[id number]. Topic: Clinical - Red Word Triage >> Oct 22, 2023 11:47 AM [place] MATSU wrote: Red Word that prompted transfer to Nurse Triage: painful headache, cough, stuffy nose, congestion. Its been about 3 days not getting better Reason for Disposition  Cough with cold symptoms (e.g., runny nose, postnasal drip, throat clearing)  Answer Assessment - Initial Assessment Questions 1. ONSET: When did the cough begin?      3 days ago   3. SPUTUM: Describe the color of your sputum (e.g., none, dry cough; clear, white, yellow, green)     Green from nose and whitish cough sputum  4. HEMOPTYSIS: Are you coughing up any blood? If Yes, ask: How much? (e.g., flecks, streaks, tablespoons, etc.)     no 5. DIFFICULTY BREATHING: Are you having difficulty breathing? If Yes, ask: How bad is it? (e.g., mild, moderate, severe)      no 6. FEVER: Do you have a fever? If Yes, ask: What is your temperature, how was it measured, and when did it start?     no  10. OTHER SYMPTOMS: Do you have any other symptoms? (e.g., runny nose, wheezing, chest pain)       Coughing, congestion, syuffy nose with green nasal discharge  Protocols used: Cough - Acute Non-Productive-A-AH

## 2023-10-23 ENCOUNTER — Ambulatory Visit
Admission: RE | Admit: 2023-10-23 | Discharge: 2023-10-23 | Disposition: A | Discharge status: Home / Self Care | Source: Ambulatory Visit

## 2023-10-23 DIAGNOSIS — R7989 Other specified abnormal findings of blood chemistry: Secondary | ICD-10-CM | POA: Insufficient documentation

## 2023-10-26 ENCOUNTER — Ambulatory Visit: Payer: Self-pay

## 2023-10-27 NOTE — Progress Notes (Unsigned)
 Progress Note  Physician: Nafeesah Lapaglia A Willmar Stockinger, MD   HPI: Pamela Rasmussen is a 43 y.o. female presenting on 10/28/2023 for No chief complaint on file. .  Discussed the use of AI scribe software for clinical note transcription with the patient, who gave verbal consent to proceed.  History of Present Illness             Medical history:  Relevant past medical, surgical, family and social history reviewed and updated as indicated. Interim medical history since our last visit reviewed.  Allergies and medications reviewed and updated.   ROS: Negative unless specifically indicated above in HPI.    Current Outpatient Medications:  .  Continuous Glucose Sensor (FREESTYLE LIBRE 3 SENSOR) MISC, Place 1 sensor on the skin every 14 days. Use to check glucose continuously, Disp: 2 each, Rfl: 0 .  divalproex  (DEPAKOTE  ER) 500 MG 24 hr tablet, Take 1 tablet (500 mg total) by mouth at bedtime., Disp: 90 tablet, Rfl: 3 .  Glucagon , rDNA, (GLUCAGON  EMERGENCY) 1 MG KIT, Inject 1 mg into the skin as needed for up to 2 doses (Severe low blood sugar)., Disp: 1 kit, Rfl: 0 .  insulin  isophane & regular human KwikPen (HUMULIN 70/30 MIX) (70-30) 100 UNIT/ML KwikPen, Inject 28 Units into the skin 2 (two) times daily., Disp: 15 mL, Rfl: 0 .  Insulin  Pen Needle (PEN NEEDLES) 31G X 5 MM MISC, 1 each by Does not apply route 3 (three) times daily. May dispense any manufacturer covered by patient's insurance., Disp: 100 each, Rfl: 0 .  lithium  300 MG tablet, Take 1.5 tablets (450 mg total) by mouth every evening., Disp: 90 tablet, Rfl: 3 .  metFORMIN  (GLUCOPHAGE ) 500 MG tablet, Take 1 tablet (500 mg total) by mouth 2 (two) times daily with a meal., Disp: 60 tablet, Rfl: 2 .  OLANZapine  (ZYPREXA ) 10 MG tablet, Take 1 tablet (10 mg total) by mouth at bedtime., Disp: 90 tablet, Rfl: 3 .  ondansetron  (ZOFRAN -ODT) 4 MG disintegrating tablet, Take 4 mg by mouth every 8 (eight) hours as needed., Disp: ,  Rfl:  .  PARoxetine  (PAXIL ) 10 MG tablet, Take 1 tablet (10 mg total) by mouth daily., Disp: 90 tablet, Rfl: 3 .  rosuvastatin  (CRESTOR ) 10 MG tablet, Take 1 tablet (10 mg total) by mouth daily., Disp: 90 tablet, Rfl: 3 .  valACYclovir  (VALTREX ) 500 MG tablet, Take 1 tablet (500 mg total) by mouth daily., Disp: 30 tablet, Rfl: 12       Objective:     There were no vitals taken for this visit.  Wt Readings from Last 3 Encounters:  10/22/23 239 lb 8 oz (108.6 kg)  10/19/23 239 lb (108.4 kg)  09/24/23 240 lb 2 oz (108.9 kg)    Physical Exam  Physical Exam Vitals reviewed.  Constitutional:      Appearance: Normal appearance. Well-developed with normal weight.  Cardiovascular:     Rate and Rhythm: Normal rate and regular rhythm. Normal heart sounds. Normal peripheral pulses Pulmonary:     Normal breath sounds with normal effort Skin:    General: Skin is warm and dry without noticeable rash. Neurological:     General: No focal deficit present.  Psychiatric:        Mood and Affect: Mood, behavior and cognition normal      Assessment & Plan:  No diagnosis found.  No orders of the defined types were placed in this encounter.  Assessment and Plan

## 2023-10-28 ENCOUNTER — Ambulatory Visit (INDEPENDENT_AMBULATORY_CARE_PROVIDER_SITE_OTHER)

## 2023-10-28 ENCOUNTER — Other Ambulatory Visit: Payer: Self-pay

## 2023-10-28 VITALS — BP 124/80 | HR 86 | Ht 64.0 in | Wt 241.0 lb

## 2023-10-28 DIAGNOSIS — E1165 Type 2 diabetes mellitus with hyperglycemia: Secondary | ICD-10-CM | POA: Diagnosis not present

## 2023-10-28 DIAGNOSIS — K760 Fatty (change of) liver, not elsewhere classified: Secondary | ICD-10-CM | POA: Diagnosis not present

## 2023-10-28 DIAGNOSIS — K7460 Unspecified cirrhosis of liver: Secondary | ICD-10-CM | POA: Diagnosis not present

## 2023-10-28 DIAGNOSIS — Z794 Long term (current) use of insulin: Secondary | ICD-10-CM

## 2023-10-29 ENCOUNTER — Other Ambulatory Visit

## 2023-10-29 DIAGNOSIS — K7460 Unspecified cirrhosis of liver: Secondary | ICD-10-CM

## 2023-10-29 DIAGNOSIS — B379 Candidiasis, unspecified: Secondary | ICD-10-CM

## 2023-10-29 DIAGNOSIS — E1165 Type 2 diabetes mellitus with hyperglycemia: Secondary | ICD-10-CM

## 2023-10-29 DIAGNOSIS — K760 Fatty (change of) liver, not elsewhere classified: Secondary | ICD-10-CM

## 2023-10-29 DIAGNOSIS — F3177 Bipolar disorder, in partial remission, most recent episode mixed: Secondary | ICD-10-CM

## 2023-10-29 DIAGNOSIS — F319 Bipolar disorder, unspecified: Secondary | ICD-10-CM

## 2023-10-29 DIAGNOSIS — R7989 Other specified abnormal findings of blood chemistry: Secondary | ICD-10-CM

## 2023-11-02 ENCOUNTER — Ambulatory Visit: Payer: Self-pay

## 2023-11-04 LAB — MICROALBUMIN / CREATININE URINE RATIO
Creatinine, Urine: 95 mg/dL (ref 20–275)
Microalb Creat Ratio: 5 mg/g{creat} (ref <30)
Microalb, Ur: 0.5 mg/dL

## 2023-11-04 LAB — CBC WITH DIFFERENTIAL/PLATELET
Absolute Lymphocytes: 3588 {cells}/uL (ref 850–3900)
Absolute Monocytes: 790 {cells}/uL (ref 200–950)
Basophils Absolute: 73 {cells}/uL (ref 0–200)
Basophils Relative: 0.7 %
Eosinophils Absolute: 426 {cells}/uL (ref 15–500)
Eosinophils Relative: 4.1 %
HCT: 45.8 % — ABNORMAL HIGH (ref 35.0–45.0)
Hemoglobin: 15 g/dL (ref 11.7–15.5)
MCH: 31.4 pg (ref 27.0–33.0)
MCHC: 32.8 g/dL (ref 32.0–36.0)
MCV: 96 fL (ref 80.0–100.0)
MPV: 10.6 fL (ref 7.5–12.5)
Monocytes Relative: 7.6 %
Neutro Abs: 5522 {cells}/uL (ref 1500–7800)
Neutrophils Relative %: 53.1 %
Platelets: 223 Thousand/uL (ref 140–400)
RBC: 4.77 Million/uL (ref 3.80–5.10)
RDW: 12.4 % (ref 11.0–15.0)
Total Lymphocyte: 34.5 %
WBC: 10.4 Thousand/uL (ref 3.8–10.8)

## 2023-11-04 LAB — HEPATITIS B CORE ANTIBODY, IGM: Hep B C IgM: NONREACTIVE

## 2023-11-04 LAB — COMPREHENSIVE METABOLIC PANEL WITH GFR
AG Ratio: 1.3 (calc) (ref 1.0–2.5)
ALT: 57 U/L — ABNORMAL HIGH (ref 6–29)
AST: 34 U/L — ABNORMAL HIGH (ref 10–30)
Albumin: 4.1 g/dL (ref 3.6–5.1)
Alkaline phosphatase (APISO): 116 U/L (ref 31–125)
BUN: 8 mg/dL (ref 7–25)
CO2: 26 mmol/L (ref 20–32)
Calcium: 9.6 mg/dL (ref 8.6–10.2)
Chloride: 100 mmol/L (ref 98–110)
Creat: 0.87 mg/dL (ref 0.50–0.99)
Globulin: 3.1 g/dL (ref 1.9–3.7)
Glucose, Bld: 321 mg/dL — ABNORMAL HIGH (ref 65–99)
Potassium: 4 mmol/L (ref 3.5–5.3)
Sodium: 135 mmol/L (ref 135–146)
Total Bilirubin: 0.4 mg/dL (ref 0.2–1.2)
Total Protein: 7.2 g/dL (ref 6.1–8.1)
eGFR: 85 mL/min/1.73m2 (ref >=60)

## 2023-11-04 LAB — IRON,TIBC AND FERRITIN PANEL
%SAT: 20 % (ref 16–45)
Ferritin: 22 ng/mL (ref 16–232)
Iron: 64 ug/dL (ref 40–190)
TIBC: 316 ug/dL (ref 250–450)

## 2023-11-04 LAB — ANTI-NUCLEAR AB-TITER (ANA TITER)
ANA TITER: 1:40 {titer} — ABNORMAL HIGH
ANA Titer 1: 1:40 {titer} — ABNORMAL HIGH

## 2023-11-04 LAB — PROTIME-INR
INR: 1
Prothrombin Time: 10.3 s (ref 9.0–11.5)

## 2023-11-04 LAB — HEPATITIS C ANTIBODY: Hepatitis C Ab: NONREACTIVE

## 2023-11-04 LAB — HEMOGLOBIN A1C
Hgb A1c MFr Bld: 13 % — ABNORMAL HIGH (ref <5.7)
Mean Plasma Glucose: 326 mg/dL
eAG (mmol/L): 18.1 mmol/L

## 2023-11-04 LAB — CERULOPLASMIN: Ceruloplasmin: 26 mg/dL (ref 14–48)

## 2023-11-04 LAB — ANTI-SMOOTH MUSCLE ANTIBODY, IGG: Actin (Smooth Muscle) Antibody (IGG): <20 U (ref <20)

## 2023-11-04 LAB — ANA: Anti Nuclear Antibody (ANA): POSITIVE — AB

## 2023-11-04 LAB — ANTI-MICROSOMAL ANTIBODY LIVER / KIDNEY: LKM1 Ab: <=20 U (ref <=20.0)

## 2023-11-04 LAB — IGG: IgG (Immunoglobin G), Serum: 1109 mg/dL (ref 600–1640)

## 2023-11-04 LAB — HEPATITIS B SURFACE ANTIBODY,QUALITATIVE: Hep B S Ab: REACTIVE — AB

## 2023-11-10 ENCOUNTER — Ambulatory Visit

## 2023-11-11 ENCOUNTER — Telehealth

## 2023-11-11 DIAGNOSIS — F4312 Post-traumatic stress disorder, chronic: Secondary | ICD-10-CM

## 2023-11-11 DIAGNOSIS — F319 Bipolar disorder, unspecified: Secondary | ICD-10-CM

## 2023-11-11 DIAGNOSIS — F3178 Bipolar disorder, in full remission, most recent episode mixed: Secondary | ICD-10-CM

## 2023-11-11 DIAGNOSIS — K760 Fatty (change of) liver, not elsewhere classified: Secondary | ICD-10-CM | POA: Diagnosis not present

## 2023-11-11 DIAGNOSIS — E1169 Type 2 diabetes mellitus with other specified complication: Secondary | ICD-10-CM

## 2023-11-11 DIAGNOSIS — Z794 Long term (current) use of insulin: Secondary | ICD-10-CM

## 2023-11-11 DIAGNOSIS — F4010 Social phobia, unspecified: Secondary | ICD-10-CM

## 2023-11-11 MED ORDER — INSULIN ISOPHANE & REGULAR (HUMAN 70-30)100 UNIT/ML KWIKPEN: 53.0000 [IU] | PEN_INJECTOR | Freq: Two times a day (BID) | SUBCUTANEOUS | 2 refills | Status: AC

## 2023-11-11 NOTE — Progress Notes (Signed)
 Progress Note  Physician: Caitlyn Buchanan A [name], MD   HPI: Pamela Rasmussen is a 43 y.o. female presenting on 11/11/2023 for No chief complaint on file. .  Discussed the use of AI scribe software for clinical note transcription with the patient, who gave verbal consent to proceed.  Virtual Visit via Video Note  I connected with Pamela Rasmussen on 11/11/23 at 10:20 AM EDT by a video enabled telemedicine application and verified that I am speaking with the correct person     History of Present Illness        Patient seen in follow-up.  She has been on 50 units of Humulin 70/30 twice daily.  Blood sugars running consistently in the 200s.  Her most recent hemoglobin A1c is at 13.  Patient continues to work on diet.  Bipolar treated with lithium  Depakote , Zyprexa , Paxil .  She feels mood is stable.  Otherwise we are waiting on gastroenterology evaluation.  Have in further workup for fatty liver discovered significant fibrosis.  General evaluation for autoimmune and other underlying causes of liver disease so far negative.  She has a positive ANA with titer at that diagnostic.  Patient denies any abdominal pain     Medical history:  Relevant past medical, surgical, family and social history reviewed and updated as indicated. Interim medical history since our last visit reviewed.  Allergies and medications reviewed and updated.   ROS: Negative unless specifically indicated above in HPI.    Current Outpatient Medications:    Continuous Glucose Sensor (FREESTYLE LIBRE 3 SENSOR) MISC, Place 1 sensor on the skin every 14 days. Use to check glucose continuously, Disp: 2 each, Rfl: 0   divalproex  (DEPAKOTE  ER) 500 MG 24 hr tablet, Take 1 tablet (500 mg total) by mouth at bedtime., Disp: 90 tablet, Rfl: 3   Glucagon , rDNA, (GLUCAGON  EMERGENCY) 1 MG KIT, Inject 1 mg into the skin as needed for up to 2 doses (Severe low blood sugar)., Disp: 1 kit, Rfl: 0   insulin  isophane  & regular human KwikPen (HUMULIN 70/30 MIX) (70-30) 100 UNIT/ML KwikPen, Inject 53 Units into the skin 2 (two) times daily., Disp: 95.4 mL, Rfl: 2   Insulin  Pen Needle (PEN NEEDLES) 31G X 5 MM MISC, 1 each by Does not apply route 3 (three) times daily. May dispense any manufacturer covered by patient's insurance., Disp: 100 each, Rfl: 0   lithium  300 MG tablet, Take 1.5 tablets (450 mg total) by mouth every evening., Disp: 90 tablet, Rfl: 3   metFORMIN  (GLUCOPHAGE ) 500 MG tablet, Take 1 tablet (500 mg total) by mouth 2 (two) times daily with a meal. (Patient not taking: Reported on 10/28/2023), Disp: 60 tablet, Rfl: 2   OLANZapine  (ZYPREXA ) 10 MG tablet, Take 1 tablet (10 mg total) by mouth at bedtime., Disp: 90 tablet, Rfl: 3   ondansetron  (ZOFRAN -ODT) 4 MG disintegrating tablet, Take 4 mg by mouth every 8 (eight) hours as needed., Disp: , Rfl:    PARoxetine  (PAXIL ) 10 MG tablet, Take 1 tablet (10 mg total) by mouth daily., Disp: 90 tablet, Rfl: 3   rosuvastatin  (CRESTOR ) 10 MG tablet, Take 1 tablet (10 mg total) by mouth daily., Disp: 90 tablet, Rfl: 3   valACYclovir  (VALTREX ) 500 MG tablet, Take 1 tablet (500 mg total) by mouth daily., Disp: 30 tablet, Rfl: 12       Objective:     LMP 10/13/2023   Wt Readings from Last  3 Encounters:  10/28/23 241 lb (109.3 kg)  10/22/23 239 lb 8 oz (108.6 kg)  10/19/23 239 lb (108.4 kg)         Assessment & Plan:   Encounter Diagnoses  Name Primary?   Hepatic steatosis Yes   Type 2 diabetes mellitus with other specified complication, with long-term current use of insulin  (HCC)    Bipolar depression (HCC)    Chronic post-traumatic stress disorder (PTSD)    Social anxiety disorder    Bipolar disorder, in full remission, most recent episode mixed     No orders of the defined types were placed in this encounter.    Assessment and Plan    I personally reviewed and interpreted the patient labs today.        #1 type 2 diabetes increase insulin   to 53 units twice daily continue to keep glucose log plan to have her follow-up in 4 weeks to review  2.  Bipolar disorder.  Patient remained stable on a number of medications.  We are waiting for psychiatry referral.  3. Hepatic steatosis with significant fibrosis on elastography.  We are waiting on GI referral.

## 2023-11-13 ENCOUNTER — Ambulatory Visit: Payer: Self-pay

## 2023-11-13 NOTE — Telephone Encounter (Signed)
 FYI Only or Action Required?: Action required by provider: prescription for vaginal yeast symptoms.  Patient was last seen in primary care on 10/28/2023 by Zafirov, Clarissa A, MD.  Called Nurse Triage reporting Vaginal Discharge.  Symptoms began several days ago.  Interventions attempted: Rest, hydration, or home remedies.  Symptoms are: gradually worsening.  Triage Disposition: See PCP When Office is Open (Within 3 Days)  Patient/caregiver understands and will follow disposition?: No, wishes to speak with PCP    Copied from CRM #[id number]. Topic: Clinical - Red Word Triage >> Nov 13, 2023  4:33 PM Winona R wrote: Yeast infection symptoms, itching, pain , and clumpy discharge Reason for Disposition  [1] Symptoms of a yeast infection (i.e., itchy, white discharge, not bad smelling) AND [2] not improved > 3 days following Care Advice  Answer Assessment - Initial Assessment Questions Additional info:  1) Declined appointment. Requesting medication for vaginal yeast infection to [facility].     1. DISCHARGE: Describe the discharge. (e.g., white, yellow, green, gray, foamy, cottage cheese-like)     Clumpy discharge that is white 2. ODOR: Is there a bad odor?     mild 3. ONSET: When did the discharge begin?     Few days 4. RASH: Is there a rash in the genital area? If Yes, ask: Describe it. (e.g., redness, blisters, sores, bumps)     Not noted 5. ABDOMEN PAIN: Are you having any abdomen pain? If Yes, ask: What does it feel like?  (e.g., crampy, dull, intermittent, constant)      denies 6. ABDOMEN PAIN SEVERITY: If present, ask: How bad is it? (e.g., Scale 1-10; mild, moderate, or severe)     0/10 7. CAUSE: What do you think is causing the discharge? Have you had the same problem before? What happened then?     Yeast-has infection often 8. OTHER SYMPTOMS: Do you have any other symptoms? (e.g., fever, itching, urination pain, vaginal bleeding,  vaginal foreign body)     Itching, painful.  Protocols used: Vaginal Discharge-A-AH

## 2023-11-16 ENCOUNTER — Other Ambulatory Visit: Payer: Self-pay

## 2023-11-16 DIAGNOSIS — N760 Acute vaginitis: Secondary | ICD-10-CM

## 2023-11-16 MED ORDER — FLUCONAZOLE 150 MG PO TABS: 150.0000 mg | ORAL_TABLET | Freq: Once | ORAL | 0 refills | Status: AC

## 2023-11-16 NOTE — Telephone Encounter (Signed)
Patient notified medication sent in.

## 2024-01-04 ENCOUNTER — Ambulatory Visit

## 2024-01-04 NOTE — Telephone Encounter (Signed)
 This encounter was created in error - please disregard.

## 2024-01-18 ENCOUNTER — Ambulatory Visit: Payer: Self-pay

## 2024-01-18 NOTE — Telephone Encounter (Signed)
 FYI Only or Action Required?: FYI only for provider: appointment scheduled on 01/20/24.  Patient was last seen in primary care on 11/11/2023 by Zafirov, Clarissa A, MD.  Called Nurse Triage reporting Vaginitis.  Symptoms began several days ago.  Interventions attempted: Nothing.  Symptoms are: unchanged.  Triage Disposition: See Physician Within 24 Hours  Patient/caregiver understands and will follow disposition?: Yes   Message from [facility] S sent at 01/18/2024 11:22 AM EST  Reason for Triage: possible yeast infection would like for nurse to call her [phone]    Reason for Disposition  MODERATE-SEVERE itching (i.e., interferes with school, work, or sleep)  Answer Assessment - Initial Assessment Questions Patient says she usually will get these yeast infections monthly or every other month and the doctor will usually send something in for her to take. Advised OV first available is on Wednesday with PCP, she agreed to that appointment.   1. SYMPTOM: What's the main symptom you're concerned about? (e.g., pain, itching, dryness)     Itching  2. LOCATION: Where is the itching located? (e.g., inside/outside, left/right)     Both inside and outside  3. ONSET: When did the  symptoms  start?     Friday  4. PAIN: Is there any pain? If Yes, ask: How bad is it? (Scale: 1-10; mild, moderate, severe)     No  5. ITCHING: Is there any itching? If Yes, ask: How bad is it? (Scale: 1-10; mild, moderate, severe)     10  6. CAUSE: What do you think is causing the discharge? Have you had the same problem before? What happened then?     Yeast infection; same a before about a month ago  7. OTHER SYMPTOMS: Do you have any other symptoms? (e.g., fever, itching, vaginal bleeding, pain with urination, injury to genital area, vaginal foreign body)     Discharge white  8. PREGNANCY: Is there any chance you are pregnant? When was your last menstrual period?      No  Protocols used: Vaginal Symptoms-A-AH

## 2024-01-18 NOTE — Telephone Encounter (Signed)
° °  Reason for Triage: possible yeast infection would like for nurse to call her [phone]

## 2024-01-20 ENCOUNTER — Ambulatory Visit

## 2024-02-13 ENCOUNTER — Other Ambulatory Visit: Payer: Self-pay

## 2024-02-13 ENCOUNTER — Emergency Department: Admission: EM | Admit: 2024-02-13 | Discharge: 2024-02-13 | Disposition: A | Discharge status: Home / Self Care

## 2024-02-13 ENCOUNTER — Emergency Department

## 2024-02-13 DIAGNOSIS — Y9389 Activity, other specified: Secondary | ICD-10-CM | POA: Insufficient documentation

## 2024-02-13 DIAGNOSIS — M7061 Trochanteric bursitis, right hip: Secondary | ICD-10-CM | POA: Diagnosis not present

## 2024-02-13 DIAGNOSIS — E119 Type 2 diabetes mellitus without complications: Secondary | ICD-10-CM | POA: Diagnosis not present

## 2024-02-13 DIAGNOSIS — M25551 Pain in right hip: Secondary | ICD-10-CM | POA: Diagnosis present

## 2024-02-13 LAB — POC URINE PREG, ED: Preg Test, Ur: NEGATIVE

## 2024-02-13 MED ORDER — NAPROXEN 500 MG PO TABS: 500.0000 mg | ORAL_TABLET | Freq: Two times a day (BID) | ORAL | 0 refills | Status: AC

## 2024-02-13 NOTE — ED Triage Notes (Addendum)
 Pt presents for right hip pain. Denies known trauma. Pain increased with ambulation and weight bearing. Also increased with laying on that side. Described as sharp like something is rubbing. Denies numbness, tingling. Tried tylenol  and ibuprofen without relief.

## 2024-02-13 NOTE — ED Provider Notes (Signed)
" ° °  [facility] Provider Note    Event Date/Time   First MD Initiated Contact with Patient 02/13/24 [phone]     (approximate)   History   Hip Pain (/)   HPI  Pamela Rasmussen is a 44 y.o. female with history of bipolar 1, PTSD, type 2 diabetes, and as listed in EMR presents to the emergency department for treatment and evaluation of right sided hip pain.  No trauma.  Pain increases with laying on that hip, ambulating, and certain movements.  Pain is sharp and feels like something is rubbing on the inside.  No radicular symptoms. No relief with Tylenol  and ibuprofen.      Physical Exam    Vitals:   02/13/24 0700 02/13/24 0722  BP: (!) 151/90   Pulse: 89   Resp: 18   Temp: 98 F (36.7 C)   SpO2: 97% 97%    General: Awake, no distress.  CV:  Good peripheral perfusion.  Resp:  Normal effort.  Abd:  No distention.  Other:  Pain with knee flexion and external rotation. Pain with straight leg raise.   ED Results / Procedures / Treatments   Labs (all labs ordered are listed, but only abnormal results are displayed)  Labs Reviewed  POC URINE PREG, ED     EKG  Not indicated.   RADIOLOGY  Image and radiology report reviewed and interpreted by me. Radiology report consistent with the same.  Image of the right hip negative for acute concerns.  PROCEDURES:  Critical Care performed: No  Procedures   MEDICATIONS ORDERED IN ED:  Medications - No data to display   IMPRESSION / MDM / ASSESSMENT AND PLAN / ED COURSE   I have reviewed the triage note and vital signs. Vital signs are stable.   Differential diagnosis includes, but is not limited to, trochanteric bursitis, osteoarthritis, musculoskeletal strain  Patient's presentation is most consistent with acute illness / injury with system symptoms.  44 year old female presenting to the emergency department for treatment and evaluation of nontraumatic right hip pain.  See HPI for  further details.  Exam and symptoms  are most consistent with trochanteric bursitis.  X-ray shows no acute bony abnormality.  Plan will be to have her take Naprosyn  twice a day and follow-up with primary care or orthopedics if not improving with rest.  ER return precautions discussed as well.      FINAL CLINICAL IMPRESSION(S) / ED DIAGNOSES   Final diagnoses:  Trochanteric bursitis, right hip     Rx / DC Orders   ED Discharge Orders          Ordered    naproxen  (NAPROSYN ) 500 MG tablet  2 times daily with meals        02/13/24 0939             Note:  This document was prepared using Dragon voice recognition software and may include unintentional dictation errors.   Pamela Kirk NOVAK, FNP 02/14/24 1009    Fernand Rossie HERO, MD 02/14/24 1239  "

## 2024-02-13 NOTE — Discharge Instructions (Addendum)
 Please follow-up with your primary care provider or orthopedics if not improving over the week.  Take the medication as prescribed twice per day with food.  Return to the emergency department if symptoms change or worsen if you are unable to schedule an appointment.

## 2024-02-17 ENCOUNTER — Ambulatory Visit

## 2024-02-24 ENCOUNTER — Ambulatory Visit: Admitting: Podiatry

## 2024-03-04 ENCOUNTER — Emergency Department
Admission: EM | Admit: 2024-03-04 | Discharge: 2024-03-04 | Discharge status: Against Medical Advice | Attending: Emergency Medicine | Admitting: Emergency Medicine

## 2024-03-04 DIAGNOSIS — M25552 Pain in left hip: Secondary | ICD-10-CM | POA: Diagnosis present

## 2024-03-04 DIAGNOSIS — Z5321 Procedure and treatment not carried out due to patient leaving prior to being seen by health care provider: Secondary | ICD-10-CM | POA: Diagnosis not present

## 2024-03-04 DIAGNOSIS — M25551 Pain in right hip: Secondary | ICD-10-CM | POA: Insufficient documentation

## 2024-03-04 NOTE — ED Triage Notes (Signed)
 Pt arrives via POV with c/o bilateral hip pain that got worse 2-3 days ago. Pt was dx with bursitis 2 weeks ago and stated that the medication that was prescribed to them made them itch and didn't do anything. Pt is hoping to get a shot to help with the pain. Pt is ambulatory and A&Ox4 during triage.

## 2024-03-04 NOTE — ED Notes (Addendum)
Left without being seen after triage.
